# Patient Record
Sex: Female | Born: 1955 | Race: Black or African American | Hispanic: No | Marital: Single | Smoking: Never smoker
Health system: Southern US, Community
[De-identification: ages and names within clinical notes are randomized; demographics above are authoritative.]

## PROBLEM LIST (undated history)

## (undated) DIAGNOSIS — R7303 Prediabetes: Secondary | ICD-10-CM

## (undated) DIAGNOSIS — E119 Type 2 diabetes mellitus without complications: Secondary | ICD-10-CM

## (undated) DIAGNOSIS — I4891 Unspecified atrial fibrillation: Secondary | ICD-10-CM

## (undated) DIAGNOSIS — E669 Obesity, unspecified: Secondary | ICD-10-CM

## (undated) DIAGNOSIS — F32A Depression, unspecified: Secondary | ICD-10-CM

## (undated) DIAGNOSIS — F329 Major depressive disorder, single episode, unspecified: Secondary | ICD-10-CM

## (undated) DIAGNOSIS — Z531 Procedure and treatment not carried out because of patient's decision for reasons of belief and group pressure: Secondary | ICD-10-CM

## (undated) DIAGNOSIS — I483 Typical atrial flutter: Principal | ICD-10-CM

## (undated) DIAGNOSIS — F419 Anxiety disorder, unspecified: Secondary | ICD-10-CM

## (undated) DIAGNOSIS — G43909 Migraine, unspecified, not intractable, without status migrainosus: Secondary | ICD-10-CM

## (undated) DIAGNOSIS — I10 Essential (primary) hypertension: Secondary | ICD-10-CM

## (undated) HISTORY — DX: Prediabetes: R73.03

## (undated) HISTORY — DX: Obesity, unspecified: E66.9

## (undated) HISTORY — DX: Depression, unspecified: F32.A

## (undated) HISTORY — DX: Typical atrial flutter: I48.3

## (undated) HISTORY — DX: Anxiety disorder, unspecified: F41.9

## (undated) HISTORY — DX: Morbid (severe) obesity due to excess calories: E66.01

## (undated) HISTORY — DX: Migraine, unspecified, not intractable, without status migrainosus: G43.909

## (undated) HISTORY — DX: Major depressive disorder, single episode, unspecified: F32.9

## (undated) HISTORY — PX: ABLATION: SHX5711

## (undated) HISTORY — DX: Unspecified atrial fibrillation: I48.91

## (undated) HISTORY — DX: Essential (primary) hypertension: I10

---

## 1999-01-10 ENCOUNTER — Emergency Department (HOSPITAL_COMMUNITY): Admission: EM | Admit: 1999-01-10 | Discharge: 1999-01-10 | Payer: Self-pay | Admitting: Emergency Medicine

## 2002-02-27 ENCOUNTER — Encounter: Admission: RE | Admit: 2002-02-27 | Discharge: 2002-02-27 | Payer: Self-pay | Admitting: Family Medicine

## 2002-02-27 ENCOUNTER — Encounter: Payer: Self-pay | Admitting: Family Medicine

## 2002-03-02 ENCOUNTER — Encounter: Admission: RE | Admit: 2002-03-02 | Discharge: 2002-04-05 | Payer: Self-pay | Admitting: Family Medicine

## 2002-05-01 ENCOUNTER — Encounter: Payer: Self-pay | Admitting: Family Medicine

## 2002-05-01 ENCOUNTER — Ambulatory Visit (HOSPITAL_COMMUNITY): Admission: RE | Admit: 2002-05-01 | Discharge: 2002-05-01 | Payer: Self-pay | Admitting: Family Medicine

## 2002-05-01 ENCOUNTER — Encounter (INDEPENDENT_AMBULATORY_CARE_PROVIDER_SITE_OTHER): Payer: Self-pay | Admitting: Internal Medicine

## 2003-03-08 ENCOUNTER — Encounter: Admission: RE | Admit: 2003-03-08 | Discharge: 2003-03-08 | Payer: Self-pay | Admitting: Internal Medicine

## 2003-03-14 ENCOUNTER — Encounter: Admission: RE | Admit: 2003-03-14 | Discharge: 2003-03-14 | Payer: Self-pay | Admitting: Internal Medicine

## 2003-03-21 ENCOUNTER — Encounter: Admission: RE | Admit: 2003-03-21 | Discharge: 2003-03-21 | Payer: Self-pay | Admitting: Internal Medicine

## 2003-10-23 ENCOUNTER — Encounter: Admission: RE | Admit: 2003-10-23 | Discharge: 2003-10-23 | Payer: Self-pay | Admitting: Family Medicine

## 2003-11-21 ENCOUNTER — Encounter: Admission: RE | Admit: 2003-11-21 | Discharge: 2004-02-19 | Payer: Self-pay | Admitting: Neurology

## 2004-04-13 ENCOUNTER — Encounter: Admission: RE | Admit: 2004-04-13 | Discharge: 2004-04-13 | Payer: Self-pay | Admitting: Internal Medicine

## 2005-05-31 ENCOUNTER — Ambulatory Visit: Payer: Self-pay | Admitting: Internal Medicine

## 2005-08-13 ENCOUNTER — Encounter: Admission: RE | Admit: 2005-08-13 | Discharge: 2005-11-07 | Payer: Self-pay | Admitting: Internal Medicine

## 2006-08-02 ENCOUNTER — Ambulatory Visit: Payer: Self-pay | Admitting: Internal Medicine

## 2006-08-04 DIAGNOSIS — I10 Essential (primary) hypertension: Secondary | ICD-10-CM | POA: Insufficient documentation

## 2006-08-04 DIAGNOSIS — E669 Obesity, unspecified: Secondary | ICD-10-CM | POA: Insufficient documentation

## 2006-08-04 DIAGNOSIS — M542 Cervicalgia: Secondary | ICD-10-CM

## 2006-08-10 ENCOUNTER — Ambulatory Visit (HOSPITAL_COMMUNITY): Admission: RE | Admit: 2006-08-10 | Discharge: 2006-08-10 | Payer: Self-pay | Admitting: Internal Medicine

## 2006-12-22 DIAGNOSIS — R209 Unspecified disturbances of skin sensation: Secondary | ICD-10-CM

## 2007-04-07 ENCOUNTER — Emergency Department (HOSPITAL_COMMUNITY): Admission: EM | Admit: 2007-04-07 | Discharge: 2007-04-07 | Payer: Self-pay | Admitting: Emergency Medicine

## 2007-04-11 ENCOUNTER — Encounter: Admission: RE | Admit: 2007-04-11 | Discharge: 2007-06-19 | Payer: Self-pay | Admitting: Family Medicine

## 2007-04-12 ENCOUNTER — Encounter: Admission: RE | Admit: 2007-04-12 | Discharge: 2007-04-12 | Payer: Self-pay | Admitting: Family Medicine

## 2007-05-16 ENCOUNTER — Encounter: Admission: RE | Admit: 2007-05-16 | Discharge: 2007-05-16 | Payer: Self-pay | Admitting: Orthopedic Surgery

## 2007-08-03 ENCOUNTER — Telehealth: Payer: Self-pay | Admitting: *Deleted

## 2007-08-04 ENCOUNTER — Ambulatory Visit: Payer: Self-pay | Admitting: Internal Medicine

## 2007-08-04 ENCOUNTER — Encounter (INDEPENDENT_AMBULATORY_CARE_PROVIDER_SITE_OTHER): Payer: Self-pay | Admitting: Internal Medicine

## 2007-08-04 ENCOUNTER — Telehealth (INDEPENDENT_AMBULATORY_CARE_PROVIDER_SITE_OTHER): Payer: Self-pay | Admitting: *Deleted

## 2007-08-17 LAB — CONVERTED CEMR LAB
BUN: 8 mg/dL (ref 6–23)
CO2: 30 meq/L (ref 19–32)
Calcium: 8.9 mg/dL (ref 8.4–10.5)
Chloride: 101 meq/L (ref 96–112)
Glucose, Bld: 91 mg/dL (ref 70–99)
Potassium: 3.5 meq/L (ref 3.5–5.3)
Sodium: 138 meq/L (ref 135–145)

## 2007-08-28 ENCOUNTER — Ambulatory Visit: Payer: Self-pay | Admitting: *Deleted

## 2007-08-28 ENCOUNTER — Encounter (INDEPENDENT_AMBULATORY_CARE_PROVIDER_SITE_OTHER): Payer: Self-pay | Admitting: Internal Medicine

## 2007-10-25 ENCOUNTER — Ambulatory Visit (HOSPITAL_COMMUNITY): Admission: RE | Admit: 2007-10-25 | Discharge: 2007-10-25 | Payer: Self-pay | Admitting: Internal Medicine

## 2008-02-19 ENCOUNTER — Telehealth: Payer: Self-pay | Admitting: Infectious Disease

## 2008-09-24 ENCOUNTER — Encounter (INDEPENDENT_AMBULATORY_CARE_PROVIDER_SITE_OTHER): Payer: Self-pay | Admitting: Internal Medicine

## 2008-09-24 ENCOUNTER — Ambulatory Visit: Payer: Self-pay | Admitting: Internal Medicine

## 2008-09-24 DIAGNOSIS — L538 Other specified erythematous conditions: Secondary | ICD-10-CM

## 2008-09-25 LAB — CONVERTED CEMR LAB
ALT: 11 units/L (ref 0–35)
AST: 14 units/L (ref 0–37)
Albumin: 4.1 g/dL (ref 3.5–5.2)
Alkaline Phosphatase: 74 units/L (ref 39–117)
BUN: 8 mg/dL (ref 6–23)
CO2: 24 meq/L (ref 19–32)
Calcium: 9.3 mg/dL (ref 8.4–10.5)
Chloride: 102 meq/L (ref 96–112)
Cholesterol: 225 mg/dL — ABNORMAL HIGH (ref 0–200)
Creatinine, Ser: 0.72 mg/dL (ref 0.40–1.20)
Creatinine, Urine: 194.2 mg/dL
Glucose, Bld: 107 mg/dL — ABNORMAL HIGH (ref 70–99)
HCT: 42.4 % (ref 36.0–46.0)
HDL: 88 mg/dL (ref >39)
Hemoglobin: 13.2 g/dL (ref 12.0–15.0)
LDL Cholesterol: 125 mg/dL — ABNORMAL HIGH (ref 0–99)
MCHC: 31.1 g/dL (ref 30.0–36.0)
MCV: 93.2 fL (ref 78.0–100.0)
Microalb Creat Ratio: 7.3 mg/g (ref 0.0–30.0)
Microalb, Ur: 1.42 mg/dL (ref 0.00–1.89)
Platelets: 273 10*3/uL (ref 150–400)
Potassium: 4.1 meq/L (ref 3.5–5.3)
RBC: 4.55 M/uL (ref 3.87–5.11)
RDW: 13.4 % (ref 11.5–15.5)
Sodium: 140 meq/L (ref 135–145)
TSH: 1.651 microintl units/mL (ref 0.350–4.50)
Total Bilirubin: 0.5 mg/dL (ref 0.3–1.2)
Total CHOL/HDL Ratio: 2.6
Total Protein: 7.2 g/dL (ref 6.0–8.3)
Triglycerides: 62 mg/dL (ref <150)
VLDL: 12 mg/dL (ref 0–40)
WBC: 7 10*3/uL (ref 4.0–10.5)

## 2008-10-14 ENCOUNTER — Ambulatory Visit: Payer: Self-pay | Admitting: Internal Medicine

## 2008-10-14 LAB — CONVERTED CEMR LAB: OCCULT 3: NEGATIVE

## 2008-10-14 LAB — FECAL OCCULT BLOOD, GUAIAC: Fecal Occult Blood: NEGATIVE

## 2009-12-31 ENCOUNTER — Telehealth (INDEPENDENT_AMBULATORY_CARE_PROVIDER_SITE_OTHER): Payer: Self-pay | Admitting: Dermatology

## 2010-01-02 ENCOUNTER — Ambulatory Visit: Payer: Self-pay | Admitting: Internal Medicine

## 2010-01-02 DIAGNOSIS — M6740 Ganglion, unspecified site: Secondary | ICD-10-CM | POA: Insufficient documentation

## 2010-01-02 LAB — CONVERTED CEMR LAB
HDL: 91 mg/dL (ref >39)
LDL Cholesterol: 106 mg/dL — ABNORMAL HIGH (ref 0–99)
Sodium: 141 meq/L (ref 135–145)
Total CHOL/HDL Ratio: 2.3
Triglycerides: 59 mg/dL (ref <150)
VLDL: 12 mg/dL (ref 0–40)

## 2010-01-02 LAB — HM PAP SMEAR: HM Pap smear: NEGATIVE

## 2010-02-05 ENCOUNTER — Telehealth (INDEPENDENT_AMBULATORY_CARE_PROVIDER_SITE_OTHER): Payer: Self-pay | Admitting: Dermatology

## 2010-10-08 ENCOUNTER — Telehealth: Payer: Self-pay | Admitting: Internal Medicine

## 2010-11-16 ENCOUNTER — Ambulatory Visit: Admit: 2010-11-16 | Payer: Self-pay

## 2010-11-28 ENCOUNTER — Encounter: Payer: Self-pay | Admitting: Internal Medicine

## 2010-12-08 NOTE — Progress Notes (Signed)
Summary: Refill/gh  Phone Note Refill Request Message from:  Fax from Pharmacy on October 08, 2010 11:46 AM  Refills Requested: Medication #1:  ATENOLOL 50 MG  TABS Take 1 tablet by mouth once a day   Last Refilled: 03/10/2010 Last labs and ovffice vist were 12/2009   Method Requested: Electronic Initial call taken by: Angelina Ok RN,  October 08, 2010 11:46 AM  Follow-up for Phone Call        Refilled electronically.  Please schedule a follow-up appointment with patient's PCP and notify patient. Follow-up by: Margarito Liner MD,  October 08, 2010 11:50 AM  Additional Follow-up for Phone Call Additional follow up Details #1::        Flag to C. Boone to schedule pt with an appointment with her PCP. Angelina Ok RN  October 08, 2010 2:13 PM  Additional Follow-up by: Angelina Ok RN,  October 08, 2010 2:13 PM    Prescriptions: ATENOLOL 50 MG  TABS (ATENOLOL) Take 1 tablet by mouth once a day  #90 x 0   Entered and Authorized by:   Margarito Liner MD   Signed by:   Margarito Liner MD on 10/08/2010   Method used:   Electronically to        [facility] (retail)       [address] [place], [place]  [address]       Ph: [phone]       Fax: [phone]   RxID:   [id number]

## 2010-12-08 NOTE — Assessment & Plan Note (Signed)
Summary: OV to continue getting meds/gg  see last note   Vital Signs:  Patient profile:   55 year old female Height:      65 inches (165.10 cm) Weight:      331.1 pounds (147.14 kg) BMI:     54.06 Temp:     97.3 degrees F (36.28 degrees C) oral Pulse rate:   57 / minute BP sitting:   158 / 96  (right arm) Cuff size:   large  Vitals Entered By: Theotis Barrio NT II (January 02, 2010 10:37 AM) CC: LOWER BACK  PAIN X 3 DAY  /  MEDICATION REFILL  /  RIGHT FOOT PAIN Pain Assessment Patient in pain? yes     Location: lower back Intensity:     10+ Type: sharp Onset of pain  X3 DAYS Nutritional Status BMI of > 30 = obese  Have you ever been in a relationship where you felt threatened, hurt or afraid?No   Does patient need assistance? Functional Status Self care Ambulation Normal Comments LOWER BACK PAIN X 3 DAYS /  MEDICATION REFILL /  RIGHT FOOT PAIN    Primary Care Provider:  Valetta Close MD  CC:  LOWER BACK  PAIN X 3 DAY  /  MEDICATION REFILL  /  RIGHT FOOT PAIN.  History of Present Illness: Ms. Sobol is a 55 year old woman with pmhx of HTN, and obesity who presents to clinic for general check-up. Patient reports she is feeling good, is currently enrolled in school receiving her Masters degree. Patient would like medication refills. Patient would also like right foot to be looked at as she has been experiencing some pain while walking within the last month. She reports she feels a bony prominence over the lateral aspect of her right foot.   Depression History:      The patient denies a depressed mood most of the day and a diminished interest in her usual daily activities.         Preventive Screening-Counseling & Management  Alcohol-Tobacco     Smoking Status: never  Caffeine-Diet-Exercise     Does Patient Exercise: yes     Type of exercise: WII  Current Medications (verified): 1)  Hydrochlorothiazide 25 Mg Tabs (Hydrochlorothiazide) .... Daily 2)  Atenolol 50  Mg  Tabs (Atenolol) .... Take 1 Tablet By Mouth Once A Day  Allergies (verified): 1)  ! 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 MS:  Complains of joint pain; pain in right foot .  Physical Exam  General:  alert and well-developed.   Head:  normocephalic and atraumatic.   Eyes:  vision grossly intact, pupils equal, pupils round, and pupils reactive to light.   Neck:  supple, full ROM, and no masses.   Lungs:  normal respiratory effort and normal breath sounds.   Heart:  normal rate, regular rhythm, no murmur, no gallop, no rub, and no JVD.   Abdomen:  soft, non-tender, normal bowel sounds, no distention, and no masses.  Genitalia:  normal introitus, no external lesions, no vaginal discharge, mucosa pink and moist, no vaginal or cervical lesions, no vaginal atrophy, no friaility or hemorrhage, normal uterus size and position, and no adnexal masses or tenderness.   Msk:  normal ROM.   ganglion cyst located on lateral aspect of right foot, no drainage, slightly tender on palpation Extremities:  no LE edema Neurologic:  alert & oriented X3 and cranial nerves II-XII intact.     Impression & Recommendations:  Problem # 1:  HYPERTENSION (ICD-401.9) Assessment Deteriorated Patient's blood pressure is elevated 2/2 to running out of meds for a few days. Pt denied any headaches, vision abnormalities, or any  other complaints or concerns. In reviewing patient's blood pressure regimen, it has been relatively well controlled with a few episodes of elevated pressures. I will start patient on Norvasc today and have patient follow up in 1 month to bp recheck. Patient is agreeable to this plan. Will also check renal function and potassium today.   Her updated medication list for this problem includes:    Hydrochlorothiazide 25 Mg Tabs (Hydrochlorothiazide) .Marland Kitchen... Daily    Atenolol 50 Mg Tabs (Atenolol) .Marland Kitchen... Take 1 tablet by mouth once a day    Amlodipine Besylate 5 Mg Tabs (Amlodipine besylate) .Marland Kitchen... Take 1 tablet by mouth once a day  Orders: T-Basic Metabolic Panel [id number])  BP today: 158/96 Prior BP: 137/81 (09/24/2008)  Labs Reviewed: K+: 4.1 (09/24/2008) Creat: : 0.72 (09/24/2008)   Chol: 225 (09/24/2008)   HDL: 88 (09/24/2008)   LDL: 125 (09/24/2008)   TG: 62 (09/24/2008)  Problem # 2:  Preventive Health Care (ICD-V70.0) All immunizations and screening tests reviewed. Due to lack of insurance patient is unable to afford mammogram, however will speak with Jaynee Eagles regarding obtaining scholarship or assistance getting mammogram. PAP was also performed and flu shot given. Will also check lipid panel today. Last lipid panel was wnl.   Problem # 3:  GANGLION CYST (ICD-727.43) Assessment: New Right foot pain 2/2 ganglion cyst. Advised patient to apply warm compresses to area. Will also check an xray of foot to rule out any abnormal bony prominences and have patient follow up in 1 month. Advised NSAIDS, or tyelenol for pain management.   Orders: Diagnostic X-Ray/Fluoroscopy (Diagnostic X-Ray/Flu)  Complete Medication List: 1)  Hydrochlorothiazide 25 Mg Tabs (Hydrochlorothiazide) .... Daily 2)  Atenolol 50 Mg Tabs (Atenolol) .... Take 1 tablet by mouth once a day 3)  Amlodipine Besylate 5 Mg Tabs (Amlodipine besylate) .... Take 1 tablet by mouth once a day  Other Orders: T-Lipid Profile  [id number]) T-PAP [facility]) [phone]) Mammogram (Screening) (Mammo)  Patient Instructions: 1)  Please schedule a follow-up appointment in 1 month. 2)  Check your Blood Pressure regularly. If it is above 170: you should make an appointment. 3)  It is important that you exercise regularly at least 20 minutes 5 times a week. If you develop chest pain, have severe difficulty breathing, or feel very tired , stop exercising immediately and seek medical attention. 4)  Please apply warm compresses to right foot, and please contact office if pain or cyst worsen.  Prescriptions: ATENOLOL 50 MG  TABS (ATENOLOL) Take 1 tablet by mouth once a day  #90 x 11   Entered and Authorized by:   Melida Quitter MD   Signed by:   Melida Quitter MD on 01/02/2010   Method used:   Electronically to        [facility] (retail)  [address] [place], [place]  [address]       Ph: [phone]       Fax: [phone]   RxID:   [id number] HYDROCHLOROTHIAZIDE 25 MG TABS (HYDROCHLOROTHIAZIDE) Daily  #93 x 11   Entered and Authorized by:   Melida Quitter MD   Signed by:   Melida Quitter MD on 01/02/2010   Method used:   Electronically to        [facility] (retail)       [address] [place], [place]  [address]       Ph: [phone]       Fax: [phone]   RxID:   [id number] AMLODIPINE BESYLATE 5 MG TABS (AMLODIPINE BESYLATE) Take 1 tablet by mouth once a day  #31 x 0   Entered and Authorized by:   Melida Quitter MD   Signed by:   Melida Quitter MD on 01/02/2010   Method used:   Electronically to        [facility] (retail)       [address] [place], [place]  [address]       Ph: [phone]       Fax: [phone]   RxID:   [id number]  Process Orders Check Orders Results:     [facility]: ABN not required for this insurance Tests Sent for  requisitioning (January 02, 2010 5:14 PM):     01/02/2010: [facility] -- T-Lipid Profile [id number] (signed)     01/02/2010: [facility] -- T-Basic Metabolic Panel [phone] (signed)    Prevention & Chronic Care Immunizations   Influenza vaccine: Not documented   Influenza vaccine deferral: Refused  (01/02/2010)    Tetanus booster: Not documented    Pneumococcal vaccine: Not documented  Colorectal Screening   Hemoccult: Not documented    Colonoscopy: Not documented  Other Screening   Pap smear:  Specimen Adequacy: Satisfactory for evaluation.   Interpretation/Result:Negative for intraepithelial Lesion or Malignancy.     (09/24/2008)   Pap smear action/deferral: Ordered  (01/02/2010)   Pap smear due: 10/2009    Mammogram: normal, recheck in one year  (10/25/2007)   Mammogram action/deferral: Ordered  (01/02/2010)   Smoking status: never  (01/02/2010)  Lipids   Total Cholesterol: 225  (09/24/2008)   Lipid panel action/deferral: Lipid Panel ordered   LDL: 125  (09/24/2008)   LDL Direct: Not documented   HDL: 88  (09/24/2008)   Triglycerides: 62  (09/24/2008)  Hypertension   Last Blood Pressure: 158 / 96  (01/02/2010)   Serum creatinine: 0.72  (09/24/2008)   BMP action: Ordered   Serum potassium 4.1  (09/24/2008)    Hypertension flowsheet reviewed?: Yes   Progress toward BP goal: Deteriorated  Self-Management Support :   Personal Goals (by the next clinic visit) :      Personal blood pressure goal: 140/90  (01/02/2010)   Patient will work on the following items until the next clinic visit to reach self-care goals:     Medications and monitoring: take my medicines every day, bring all of my medications to every visit  (01/02/2010)     Eating: drink diet soda or water instead of juice or soda, eat more vegetables,  use fresh or frozen vegetables, eat foods that are low in salt, eat baked foods instead of fried foods, eat fruit  for snacks and desserts, limit or avoid alcohol  (01/02/2010)    Hypertension self-management support: BP self-monitoring log, Education handout, Resources for patients handout, Written self-care plan  (01/02/2010)   Hypertension self-care plan printed.   Hypertension education handout printed      Resource handout printed.   Nursing Instructions: Pap smear today Schedule screening mammogram (see order)    Process Orders Check Orders Results:     [facility]: ABN not required for this insurance Tests Sent for requisitioning (January 02, 2010 5:14 PM):     01/02/2010: [facility] -- T-Lipid Profile [phone] (signed)     01/02/2010: [facility] -- T-Basic Metabolic Panel [id number] (signed)

## 2010-12-08 NOTE — Progress Notes (Signed)
Summary: refill/ hla  Phone Note Refill Request Message from:  Patient on February 05, 2010 11:15 AM  Refills Requested: Medication #1:  AMLODIPINE BESYLATE 5 MG TABS Take 1 tablet by mouth once a day.   Dosage confirmed as above?Dosage Confirmed   Last Refilled: 2/25 Initial call taken by: Marin Roberts RN,  February 05, 2010 11:18 AM  Follow-up for Phone Call       Follow-up by: Aris Lot MD,  February 05, 2010 2:03 PM    Prescriptions: AMLODIPINE BESYLATE 5 MG TABS (AMLODIPINE BESYLATE) Take 1 tablet by mouth once a day  #31 x 6   Entered and Authorized by:   Aris Lot MD   Signed by:   Aris Lot MD on 02/05/2010   Method used:   Electronically to        [facility] (retail)       [address] [place], [place]  [address]       Ph: [phone]       Fax: [phone]   RxID:   [id number]

## 2010-12-08 NOTE — Progress Notes (Signed)
Summary: refill/gg  Phone Note Refill Request  on December 31, 2009 9:21 AM  Refills Requested: Medication #1:  ATENOLOL 50 MG  TABS Take 1 tablet by mouth once a day   Last Refilled: 09/10/2009  Method Requested: Electronic Initial call taken by: Merrie Roof RN,  December 31, 2009 9:21 AM  Follow-up for Phone Call        Refill approved-nurse to complete  Patient has not been in clinic in quite some time. Please call to have her scheduled for a regular followup appointment in the near future.  Additional Follow-up for Phone Call Additional follow up Details #1::        Pt phone disconnected, pharmacy informed and will have pt call us. Additional Follow-up by: Merrie Roof RN,  December 31, 2009 2:22 PM

## 2010-12-22 ENCOUNTER — Encounter: Payer: Self-pay | Admitting: Internal Medicine

## 2011-01-27 ENCOUNTER — Other Ambulatory Visit: Payer: Self-pay | Admitting: *Deleted

## 2011-01-27 MED ORDER — AMLODIPINE BESYLATE 5 MG PO TABS: 5.0000 mg | ORAL_TABLET | Freq: Every day | ORAL | Status: DC

## 2011-01-27 NOTE — Telephone Encounter (Signed)
Pt has an appointment on 02/02/2011.

## 2011-02-02 ENCOUNTER — Ambulatory Visit: Payer: Self-pay | Admitting: Internal Medicine

## 2011-02-05 ENCOUNTER — Other Ambulatory Visit: Payer: Self-pay | Admitting: *Deleted

## 2011-02-08 MED ORDER — ATENOLOL 50 MG PO TABS: 50.0000 mg | ORAL_TABLET | Freq: Every day | ORAL | Status: DC

## 2011-02-23 ENCOUNTER — Ambulatory Visit: Payer: Self-pay | Admitting: Internal Medicine

## 2011-02-23 ENCOUNTER — Other Ambulatory Visit: Payer: Self-pay | Admitting: *Deleted

## 2011-02-24 MED ORDER — AMLODIPINE BESYLATE 5 MG PO TABS: 5.0000 mg | ORAL_TABLET | Freq: Every day | ORAL | Status: DC

## 2011-03-22 ENCOUNTER — Encounter: Payer: Self-pay | Admitting: Internal Medicine

## 2011-03-30 ENCOUNTER — Other Ambulatory Visit: Payer: Self-pay | Admitting: *Deleted

## 2011-03-30 DIAGNOSIS — I10 Essential (primary) hypertension: Secondary | ICD-10-CM

## 2011-03-30 MED ORDER — HYDROCHLOROTHIAZIDE 25 MG PO TABS: 25.0000 mg | ORAL_TABLET | Freq: Every day | ORAL | Status: DC

## 2011-04-01 ENCOUNTER — Other Ambulatory Visit: Payer: Self-pay | Admitting: *Deleted

## 2011-04-01 DIAGNOSIS — I10 Essential (primary) hypertension: Secondary | ICD-10-CM

## 2011-04-01 NOTE — Telephone Encounter (Signed)
Pt has an appt 6/11; but states she needs refill before the appt.

## 2011-04-02 MED ORDER — AMLODIPINE BESYLATE 5 MG PO TABS: 5.0000 mg | ORAL_TABLET | Freq: Every day | ORAL | Status: DC

## 2011-04-02 MED ORDER — HYDROCHLOROTHIAZIDE 25 MG PO TABS: 25.0000 mg | ORAL_TABLET | Freq: Every day | ORAL | Status: DC

## 2011-04-19 ENCOUNTER — Ambulatory Visit (INDEPENDENT_AMBULATORY_CARE_PROVIDER_SITE_OTHER): Payer: Self-pay | Admitting: Internal Medicine

## 2011-04-19 VITALS — BP 120/80 | HR 64 | Temp 97.7°F | Ht 65.0 in | Wt 334.3 lb

## 2011-04-19 DIAGNOSIS — I10 Essential (primary) hypertension: Secondary | ICD-10-CM

## 2011-04-19 DIAGNOSIS — Z1231 Encounter for screening mammogram for malignant neoplasm of breast: Secondary | ICD-10-CM

## 2011-04-19 DIAGNOSIS — Z0000 Encounter for general adult medical examination without abnormal findings: Secondary | ICD-10-CM

## 2011-04-19 LAB — LIPID PANEL
Cholesterol: 225 mg/dL — ABNORMAL HIGH (ref 0–200)
VLDL: 12 mg/dL (ref 0–40)

## 2011-04-19 MED ORDER — AMLODIPINE BESYLATE 5 MG PO TABS: 5.0000 mg | ORAL_TABLET | Freq: Every day | ORAL | Status: DC

## 2011-04-19 MED ORDER — ATENOLOL 50 MG PO TABS: 50.0000 mg | ORAL_TABLET | Freq: Every day | ORAL | Status: DC

## 2011-04-19 MED ORDER — HYDROCHLOROTHIAZIDE 25 MG PO TABS: 25.0000 mg | ORAL_TABLET | Freq: Every day | ORAL | Status: DC

## 2011-04-19 NOTE — Patient Instructions (Signed)
Please take your medicines as prescribed. Please schedule a follow up appointment in 3 months or earlier if needed. Please return your hemoccult cards to the clinic.

## 2011-04-20 ENCOUNTER — Encounter: Payer: Self-pay | Admitting: Internal Medicine

## 2011-04-20 DIAGNOSIS — Z0000 Encounter for general adult medical examination without abnormal findings: Secondary | ICD-10-CM | POA: Insufficient documentation

## 2011-04-20 LAB — BASIC METABOLIC PANEL WITH GFR
BUN: 12 mg/dL (ref 6–23)
Calcium: 9.5 mg/dL (ref 8.4–10.5)
GFR, Est African American: >60 mL/min (ref >60)
Potassium: 3.9 mEq/L (ref 3.5–5.3)
Sodium: 141 mEq/L (ref 135–145)

## 2011-04-20 NOTE — Progress Notes (Signed)
  Subjective:    [name], female    DOB: 04-Apr-1956, 55 y.o.   MRN: [id number]  HPI: 55 year old woman with past medical history significant for hypertension comes to the clinic for a followup visit. She reports occasional twitching of her left eye and sometimes left side of the face, the last episode was 2 weeks ago. She states  these episodes usually occur when something bad is about to happen. Her godmother got sick when she had this episode 2 weeks ago. She denies any associated weakness, numbness, slurred speech, blurry vision. The episodes are transient and resolve spontaneously. Otherwise denies any fever,chills, nausea, vomiting or  diarrhea. She is requesting  medication refill.     Review of Systems  Constitutional: Negative for fever, chills, activity change, appetite change and fatigue.  HENT: Negative for hearing loss, neck pain, neck stiffness and ear discharge.   Respiratory: Negative for apnea, cough, choking, chest tightness and shortness of breath.   Cardiovascular: Negative for chest pain, palpitations and leg swelling.  Gastrointestinal: Negative for abdominal distention.  Genitourinary: Negative for dysuria, hematuria and difficulty urinating.  Musculoskeletal: Negative for arthralgias.  Neurological: Negative for dizziness, facial asymmetry, light-headedness and headaches.  Hematological: Negative for adenopathy.  Psychiatric/Behavioral: Negative for agitation.       Objective:   Physical Exam  Constitutional: She is oriented to person, place, and time. She appears well-developed and well-nourished.       obese  HENT:  Head: Normocephalic and atraumatic.  Right Ear: External ear normal.  Left Ear: External ear normal.  Eyes: Conjunctivae and EOM are normal. Pupils are equal, round, and reactive to light.  Neck: Normal range of motion. Neck supple. No tracheal deviation present. No thyromegaly present.  Cardiovascular: Normal rate, regular  rhythm, normal heart sounds and intact distal pulses.   Pulmonary/Chest: Effort normal and breath sounds normal.  Abdominal: Soft. Bowel sounds are normal. She exhibits no distension and no mass. There is no tenderness. There is no rebound and no guarding.  Musculoskeletal: Normal range of motion.  Lymphadenopathy:    She has no cervical adenopathy.  Neurological: She is alert and oriented to person, place, and time. She has normal reflexes. No cranial nerve deficit. Coordination normal.  Skin: Skin is warm.          Assessment & Plan:

## 2011-04-20 NOTE — Assessment & Plan Note (Addendum)
She was advised lifestyle modification and exercise for weight reduction. She states that she is working on it - was encouraged to continue the same. Will refer her for mammogram. Will give her hemoccult cards and refer her for the study for colonoscopy. Will get a lipid panel. Her last pap smear from a year ago was reviewed( negative for intraepithelial cells).   Her lipid panel and BMET was reviewed. She needs to continue therapeutic lifestyle changes for the level of her total cholestrol and LDL.

## 2011-04-20 NOTE — Assessment & Plan Note (Addendum)
Her blood pressure was slightly elevated when she walked in but the repeat manual recheck was 120/80. Her medications were refilled. Will continue her on this current regimen. Will also check a BMET today.

## 2011-04-27 ENCOUNTER — Other Ambulatory Visit: Payer: Self-pay

## 2011-04-27 DIAGNOSIS — Z0000 Encounter for general adult medical examination without abnormal findings: Secondary | ICD-10-CM

## 2011-04-27 LAB — POC HEMOCCULT BLD/STL (HOME/3-CARD/SCREEN)
Card #2 Fecal Occult Blod, POC: NEGATIVE
Fecal Occult Blood, POC: NEGATIVE

## 2011-08-28 ENCOUNTER — Other Ambulatory Visit: Payer: Self-pay | Admitting: Internal Medicine

## 2011-11-01 ENCOUNTER — Other Ambulatory Visit: Payer: Self-pay | Admitting: Internal Medicine

## 2011-12-13 ENCOUNTER — Other Ambulatory Visit: Payer: Self-pay | Admitting: Internal Medicine

## 2011-12-15 ENCOUNTER — Ambulatory Visit: Payer: Self-pay | Admitting: Internal Medicine

## 2011-12-21 ENCOUNTER — Ambulatory Visit (INDEPENDENT_AMBULATORY_CARE_PROVIDER_SITE_OTHER): Payer: Self-pay | Admitting: Internal Medicine

## 2011-12-21 ENCOUNTER — Encounter: Payer: Self-pay | Admitting: Internal Medicine

## 2011-12-21 VITALS — BP 144/85 | HR 73 | Temp 97.7°F | Resp 20 | Ht 65.0 in | Wt 344.8 lb

## 2011-12-21 DIAGNOSIS — I10 Essential (primary) hypertension: Secondary | ICD-10-CM

## 2011-12-21 DIAGNOSIS — Z0000 Encounter for general adult medical examination without abnormal findings: Secondary | ICD-10-CM

## 2011-12-21 DIAGNOSIS — E785 Hyperlipidemia, unspecified: Secondary | ICD-10-CM

## 2011-12-21 LAB — COMPLETE METABOLIC PANEL WITH GFR
ALT: 19 U/L (ref 0–35)
AST: 19 U/L (ref 0–37)
Alkaline Phosphatase: 66 U/L (ref 39–117)
Creat: 0.82 mg/dL (ref 0.50–1.10)
Sodium: 140 mEq/L (ref 135–145)
Total Bilirubin: 0.3 mg/dL (ref 0.3–1.2)
Total Protein: 7.3 g/dL (ref 6.0–8.3)

## 2011-12-21 LAB — LIPID PANEL
HDL: 78 mg/dL (ref >39)
LDL Cholesterol: 118 mg/dL — ABNORMAL HIGH (ref 0–99)
Total CHOL/HDL Ratio: 2.7 Ratio
VLDL: 15 mg/dL (ref 0–40)

## 2011-12-21 MED ORDER — ATENOLOL 50 MG PO TABS: 50.0000 mg | ORAL_TABLET | Freq: Every day | ORAL | Status: DC

## 2011-12-21 MED ORDER — HYDROCHLOROTHIAZIDE 25 MG PO TABS: 25.0000 mg | ORAL_TABLET | Freq: Every day | ORAL | Status: DC

## 2011-12-21 MED ORDER — AMLODIPINE BESYLATE 5 MG PO TABS: 5.0000 mg | ORAL_TABLET | Freq: Every day | ORAL | Status: DC

## 2011-12-21 NOTE — Patient Instructions (Signed)
Please schedule a follow up appointment in 3 months or earlier if needed. . Please bring your medication bottles with your next appointment. Please take your medicines as prescribed. I will call you with your lab results if anything will be abnormal. 

## 2011-12-21 NOTE — Progress Notes (Signed)
  Subjective:    [name], female    DOB: 24-Sep-1956, 56 y.o.   MRN: [id number]  HPI: 56 year old woman with past medical history significant for hypertension, morbid obesity comes to the clinic for a followup visit.  She has been noticed to have about 10 pounds weight gain at todays's visit and was just wondering if this could be related to Norvasc .She states that she has been trying to exercise and diet.  Otherwise denies any headaches, weakness, numbness, chest pain, palpitations or dizziness.     Review of Systems  Constitutional: Negative for fever and fatigue.  HENT: Negative for nosebleeds, congestion and postnasal drip.   Respiratory: Negative for cough, shortness of breath, wheezing and stridor.   Cardiovascular: Negative for chest pain, palpitations and leg swelling.  Musculoskeletal: Negative for arthralgias.  Neurological: Negative for dizziness, facial asymmetry, weakness, light-headedness and headaches.  Hematological: Negative for adenopathy.       Objective:   Physical Exam  Constitutional: She is oriented to person, place, and time. She appears well-developed and well-nourished. No distress.  HENT:  Head: Normocephalic and atraumatic.  Mouth/Throat: No oropharyngeal exudate.  Neck: Normal range of motion. Neck supple. No JVD present. No tracheal deviation present. No thyromegaly present.  Cardiovascular: Normal rate, regular rhythm and normal heart sounds.  Exam reveals no gallop and no friction rub.   No murmur heard. Pulmonary/Chest: Effort normal and breath sounds normal. No stridor. No respiratory distress. She has no wheezes. She has no rales. She exhibits no tenderness.  Abdominal: Soft. Bowel sounds are normal. She exhibits no distension. There is no tenderness. There is no rebound and no guarding.  Musculoskeletal: Normal range of motion. She exhibits no edema and no tenderness.  Lymphadenopathy:    She has no cervical adenopathy.    Neurological: She is alert and oriented to person, place, and time. She has normal reflexes. She displays normal reflexes. No cranial nerve deficit. Coordination normal.  Skin: Skin is warm. She is not diaphoretic.          Assessment & Plan:

## 2011-12-22 NOTE — Assessment & Plan Note (Signed)
Lab Results  Component Value Date   NA 140 12/21/2011   K 4.2 12/21/2011   CL 102 12/21/2011   CO2 28 12/21/2011   BUN 12 12/21/2011   CREATININE 0.82 12/21/2011   CREATININE 0.73 01/02/2010    BP Readings from Last 3 Encounters:  12/21/11 144/85  04/20/11 120/80  01/02/10 158/96    Assessment: Hypertension control:  mildly elevated  Progress toward goals:  deteriorated Barriers to meeting goals:  no barriers identified  Plan: Hypertension treatment:  continue current medications. Check CMET today.

## 2011-12-22 NOTE — Progress Notes (Signed)
Addended by: Elyse Jarvis on: 12/22/2011 04:54 PM   Modules accepted: Orders

## 2011-12-22 NOTE — Assessment & Plan Note (Signed)
She was counseled on diet and exercise. She seems to be more debrided and states would work towards weight reduction.

## 2011-12-22 NOTE — Assessment & Plan Note (Addendum)
-   She was given a scholarship form for mammogram. - She was given a Tdap shot. - She was given Hemoccult cards - check lipid panel.  Update- LDL- 118 , no personal history of heart disease, DM, no family history of heart disease, non- smoker. Will continue with therapeutic lifestyle changes.

## 2012-01-17 ENCOUNTER — Ambulatory Visit (HOSPITAL_COMMUNITY): Payer: Self-pay

## 2012-02-15 ENCOUNTER — Ambulatory Visit (HOSPITAL_COMMUNITY)
Admission: RE | Admit: 2012-02-15 | Discharge: 2012-02-15 | Disposition: A | Discharge status: Home / Self Care | Payer: Self-pay | Source: Ambulatory Visit | Attending: Internal Medicine | Admitting: Internal Medicine

## 2012-02-15 DIAGNOSIS — Z1231 Encounter for screening mammogram for malignant neoplasm of breast: Secondary | ICD-10-CM

## 2012-04-06 ENCOUNTER — Other Ambulatory Visit: Payer: Self-pay | Admitting: Internal Medicine

## 2012-05-02 ENCOUNTER — Other Ambulatory Visit: Payer: Self-pay | Admitting: Internal Medicine

## 2012-08-14 ENCOUNTER — Other Ambulatory Visit: Payer: Self-pay | Admitting: Internal Medicine

## 2012-09-15 ENCOUNTER — Other Ambulatory Visit: Payer: Self-pay | Admitting: Internal Medicine

## 2012-09-16 ENCOUNTER — Other Ambulatory Visit: Payer: Self-pay | Admitting: Internal Medicine

## 2012-12-14 ENCOUNTER — Encounter: Payer: Self-pay | Admitting: Internal Medicine

## 2012-12-14 ENCOUNTER — Ambulatory Visit (INDEPENDENT_AMBULATORY_CARE_PROVIDER_SITE_OTHER): Payer: Self-pay | Admitting: Internal Medicine

## 2012-12-14 VITALS — BP 150/82 | HR 73 | Temp 96.9°F | Ht 65.0 in | Wt 341.5 lb

## 2012-12-14 DIAGNOSIS — H9319 Tinnitus, unspecified ear: Secondary | ICD-10-CM

## 2012-12-14 DIAGNOSIS — I10 Essential (primary) hypertension: Secondary | ICD-10-CM

## 2012-12-14 DIAGNOSIS — Z23 Encounter for immunization: Secondary | ICD-10-CM

## 2012-12-14 DIAGNOSIS — Z0000 Encounter for general adult medical examination without abnormal findings: Secondary | ICD-10-CM

## 2012-12-14 DIAGNOSIS — H9313 Tinnitus, bilateral: Secondary | ICD-10-CM | POA: Insufficient documentation

## 2012-12-14 LAB — BASIC METABOLIC PANEL WITH GFR
CO2: 31 mEq/L (ref 19–32)
Chloride: 101 mEq/L (ref 96–112)
Sodium: 140 mEq/L (ref 135–145)

## 2012-12-14 LAB — CBC
HCT: 39 % (ref 36.0–46.0)
MCV: 88.4 fL (ref 78.0–100.0)
RBC: 4.41 MIL/uL (ref 3.87–5.11)
WBC: 5.8 10*3/uL (ref 4.0–10.5)

## 2012-12-14 LAB — LIPID PANEL: LDL Cholesterol: 127 mg/dL — ABNORMAL HIGH (ref 0–99)

## 2012-12-14 MED ORDER — AMLODIPINE BESYLATE 5 MG PO TABS: 5.0000 mg | ORAL_TABLET | Freq: Every day | ORAL | Status: DC

## 2012-12-14 MED ORDER — HYDROCHLOROTHIAZIDE 25 MG PO TABS: 25.0000 mg | ORAL_TABLET | Freq: Every day | ORAL | Status: DC

## 2012-12-14 MED ORDER — ATENOLOL 50 MG PO TABS: 50.0000 mg | ORAL_TABLET | Freq: Every day | ORAL | Status: DC

## 2012-12-14 MED ORDER — ATENOLOL 100 MG PO TABS: 100.0000 mg | ORAL_TABLET | Freq: Every day | ORAL | Status: DC

## 2012-12-14 NOTE — Assessment & Plan Note (Addendum)
Etiology unclear. No red flags- hearing loss, vertigo, ear discharge etc. Ear exam is benign. Could be related to norvasc( as per up to date CCB can be ototoxic).  - Discontinue Norvasc -Continue to monitor for now.

## 2012-12-14 NOTE — Assessment & Plan Note (Addendum)
BP Readings from Last 3 Encounters:  12/14/12 150/82  12/21/11 144/85  04/20/11 120/80    Lab Results  Component Value Date   NA 140 12/21/2011   K 4.2 12/21/2011   CREATININE 0.82 12/21/2011    Assessment:  Blood pressure control: mildly elevated  Progress toward BP goal:  unchanged  Comments: Blood pressure was mildly elevated. She is running out of her atenolol. I think patient does not need to be on 3 antihypertensive medicines. I offered her that we can take her off from one medication and reassess the blood pressure in about a month, but she is adamant on being all three anti- HTN. But given tinnitus as a potential side effect from CCB( norvasc), I would discontinue that and increase dose for atenolol( with permissible HR).  She was advised to maintain a blood pressure log , with taking them twiceweekly readings and bring that with her next clinic appointment. Check BMET today.  Plan:  Medications:  Discontinue Norvasc. Increase atenolol from 50 to 100 mg daily. RTC in 2-3 weeks  Educational resources provided: brochure  Self management tools provided:    Other plans: Check BMET. Encouraged her for weight loss.

## 2012-12-14 NOTE — Assessment & Plan Note (Signed)
There was some confusion patient did not get DTaP shot with last appointment. - She was given DTaP shot today. -She does not have insurance and getting colonoscopy would be difficult. Would give her number to [facility] research group to see if she qualifies for the study. Otherwise would wait till she gets an orange card. She had 3 negative Hemoccult cards from 2012. She was given another set of hemoccult cards with today's visit.

## 2012-12-14 NOTE — Progress Notes (Signed)
Subjective:   [name]   DOB: 02/08/1956 57 y.o.   MRN: [id number]  HPI: 57 year old woman with past medical history significant for hypertension, morbid obesity presents to the clinic for a routine checkup.  Reports having some constant ringing in her ears for last few weeks. Denies any ear discharge, ear pain, hearing loss, fever, chills, nausea or vomiting or vertigo.  Her blood pressure was mildly elevated when she walked into the clinic. She states that she is running out of her atenolol for one week- did not have money to buy the medicine. Denies any headaches, blurry vision, weakness or numbness.  She is overweight and has understanding of her minimal physical activity and poor dietary habits which are probably attributing to her weight gain.  She is still working [employer]    Past Medical History  Diagnosis Date  . Hypertension    Family History  Problem Relation Age of Onset  . Hypertension Mother   . Stroke Mother 63  . COPD Father   . Hypertension Sister   . Hypertension Brother   . Cancer Brother     prostate cancer  . Diabetes Paternal Grandmother    History   Social History  . Marital Status: Divorced    Spouse Name: N/A    Number of Children: N/A  . Years of Education: N/A   Occupational History  . 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   Social History Main Topics  . Smoking status: Never Smoker   . Smokeless tobacco: Not on file  . Alcohol Use: Yes     Comment: Beer seldom  . Drug Use: No  . Sexually Active: Not on file   Other Topics Concern  . Not on file   Social History Narrative  . No narrative on file   Review of Systems: General: Denies fever, chills, diaphoresis, appetite change and fatigue. HEENT: Denies photophobia, eye pain, redness, hearing loss, ear pain, congestion, sore throat, rhinorrhea,  sneezing, mouth sores, trouble swallowing, neck pain, neck stiffness , + tinnitus. Respiratory: Denies SOB, DOE, cough, chest tightness, and wheezing. Cardiovascular: Denies to chest pain, palpitations and leg swelling. Gastrointestinal: Denies nausea, vomiting, abdominal pain, diarrhea, constipation, blood in stool and abdominal distention. Genitourinary: Denies dysuria, urgency, frequency, hematuria, flank pain and difficulty urinating. Musculoskeletal: Denies myalgias, back pain, joint swelling, arthralgias and gait problem.  Skin: Denies pallor, rash and wound. Neurological: Denies dizziness, seizures, syncope, weakness, light-headedness, numbness and headaches. Hematological: Denies adenopathy, easy bruising, personal or family bleeding history. Psychiatric/Behavioral: Denies suicidal ideation, mood changes, confusion, nervousness, sleep disturbance and agitation.    Current Outpatient Medications: Current Outpatient Prescriptions  Medication Sig Dispense Refill  . amLODipine (NORVASC) 5 MG tablet TAKE 1 TABLET BY MOUTH ONCE DAILY  30 tablet  3  . atenolol (TENORMIN) 50 MG tablet TAKE 1 TABLET BY MOUTH DAILY  30 tablet  6  . hydrochlorothiazide (HYDRODIURIL) 25 MG tablet TAKE 1 TABLET BY MOUTH DAILY.  30 tablet  3    Allergies: No Known Allergies    Objective:   Physical Exam: Filed Vitals:   12/14/12 0833  BP: 150/82  Pulse: 73  Temp: 96.9 F (36.1 C)    General: Vital signs reviewed and noted. Well-developed, well-nourished, morbidly obese, in no acute distress; alert, appropriate and cooperative throughout examination. HEENT: Normocephalic, atraumatic, right and left ear have small amount of  wax, TM appears intact. Small scab was noted on the pinna of left ear likely from some small boil being scratched. Lungs: Normal respiratory effort. Clear to auscultation BL without crackles or wheezes. Heart: RRR. S1 and S2 normal without gallop, murmur, or rubs. Abdomen:BS  normoactive. Soft, Nondistended, non-tender.  No masses or organomegaly. Extremities: No pretibial edema.     Assessment & Plan:

## 2012-12-14 NOTE — Patient Instructions (Addendum)
General Instructions: Please schedule a follow up appointment in 2-3 weeks . Please bring your medication bottles with your next appointment. Please take your medicines as prescribed. I will call you with your lab results if anything will be abnormal. Get a blood pressure log recording your BP every week with next visit.     Treatment Goals:  Goals (1 Years of Data) as of 12/14/2012    None      Progress Toward Treatment Goals:  Treatment Goal 12/14/2012  Blood pressure unchanged    Self Care Goals & Plans:  Self Care Goal 12/14/2012  Manage my medications take my medicines as prescribed; bring my medications to every visit; refill my medications on time  Eat healthy foods eat foods that are low in salt  Be physically active find an activity I enjoy       Care Management & Community Referrals:  Referral 12/14/2012  Referrals made for care management support none needed

## 2012-12-14 NOTE — Assessment & Plan Note (Signed)
Had lengthy discussion about dietary habits and exercise that would help in weight reduction. Like the previous visits, patient seems to be motivated for weight reduction. She was advised to avoid soda ,chips, fried food and enrolled into exercise programs like aerobics or zumba that would benefit her. She is willing to try.

## 2013-01-02 ENCOUNTER — Encounter: Payer: Self-pay | Admitting: Internal Medicine

## 2013-01-05 ENCOUNTER — Ambulatory Visit (INDEPENDENT_AMBULATORY_CARE_PROVIDER_SITE_OTHER): Payer: Self-pay | Admitting: Internal Medicine

## 2013-01-05 ENCOUNTER — Encounter: Payer: Self-pay | Admitting: Internal Medicine

## 2013-01-05 VITALS — BP 134/76 | HR 64 | Temp 97.0°F | Ht 65.0 in | Wt 350.5 lb

## 2013-01-05 DIAGNOSIS — M25473 Effusion, unspecified ankle: Secondary | ICD-10-CM | POA: Insufficient documentation

## 2013-01-05 NOTE — Assessment & Plan Note (Signed)
Her tinnitus has improved with discontinuation of Norvasc. Continue to monitor for now.

## 2013-01-05 NOTE — Assessment & Plan Note (Addendum)
She has gained 10 pounds from her last clinic visit in the setting of increased salt intake. On exam she was noted to have ankle edema. Differentials include venous insufficiency versus dietary indiscretions versus CHF. Renal disease seems less likely with stable creatinine. CHF seems less likely in the absence of any symptoms like chest pain, dyspnea on exertion, orthopnea. She has never had any echocardiogram. -Would check UA today to look for any proteinuria that might help to see if she has nephrotic syndrome. -Check TSH, CMET (  to check her albumin)with next appointment -She was advised to limit salt intake in her diet. -Leg elevation. -Compression stockings -May consider echo if needed to work up CHF, if indicated.
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
## 2013-01-05 NOTE — Assessment & Plan Note (Signed)
Patient has gained 10 pounds since her last clinic visit. - Check TSH with next visit.  - Also see the problem leg/ankle swelling

## 2013-01-05 NOTE — Patient Instructions (Addendum)
General Instructions: Please schedule a follow up appointment in 1-2 months . Please bring your medication bottles with your next appointment. Please take your medicines as prescribed. I will call you with your lab results if anything will be abnormal. Please limit salt in your diet. Leg elevation and please wear compression stockings.  Get BP log with next appt.     Treatment Goals:  Goals (1 Years of Data) as of 01/05/13         As of Today 12/14/12 12/21/11     Blood Pressure    . Blood Pressure < 140/90  134/76 150/82 144/85      Progress Toward Treatment Goals:  Treatment Goal 12/14/2012  Blood pressure unchanged    Self Care Goals & Plans:  Self Care Goal 01/05/2013  Manage my medications bring my medications to every visit  Eat healthy foods eat more vegetables  Be physically active find an activity I enjoy       Care Management & Community Referrals:  Referral 12/14/2012  Referrals made for care management support none needed

## 2013-01-05 NOTE — Assessment & Plan Note (Signed)
Blood pressure at goal. She does report some dizziness for last 2 weeks, usually when she turns her on rapidly but denies any vertigo, nausea, vomiting or falls associated with it. I would just monitor her symptoms on current medications.  - Continue hydrochlorothiazide and atenolol for now. -Return to clinic in one month for blood pressure recheck and followup on dizziness.

## 2013-01-06 LAB — URINALYSIS, ROUTINE W REFLEX MICROSCOPIC
Bilirubin Urine: NEGATIVE
Ketones, ur: NEGATIVE mg/dL
Leukocytes, UA: NEGATIVE
Protein, ur: NEGATIVE mg/dL
Specific Gravity, Urine: 1.014 (ref 1.005–1.030)
Urobilinogen, UA: 0.2 mg/dL (ref 0.0–1.0)

## 2013-01-17 ENCOUNTER — Other Ambulatory Visit (INDEPENDENT_AMBULATORY_CARE_PROVIDER_SITE_OTHER): Payer: Self-pay | Admitting: Internal Medicine

## 2013-01-17 LAB — POC HEMOCCULT BLD/STL (HOME/3-CARD/SCREEN): Fecal Occult Blood, POC: NEGATIVE

## 2013-02-08 ENCOUNTER — Ambulatory Visit (INDEPENDENT_AMBULATORY_CARE_PROVIDER_SITE_OTHER): Payer: Self-pay | Admitting: Internal Medicine

## 2013-02-08 ENCOUNTER — Encounter: Payer: Self-pay | Admitting: Internal Medicine

## 2013-02-08 VITALS — BP 144/82 | HR 61 | Temp 98.4°F | Ht 65.0 in | Wt 353.0 lb

## 2013-02-08 DIAGNOSIS — H9313 Tinnitus, bilateral: Secondary | ICD-10-CM

## 2013-02-08 DIAGNOSIS — M7989 Other specified soft tissue disorders: Secondary | ICD-10-CM

## 2013-02-08 DIAGNOSIS — H9319 Tinnitus, unspecified ear: Secondary | ICD-10-CM

## 2013-02-08 DIAGNOSIS — J309 Allergic rhinitis, unspecified: Secondary | ICD-10-CM

## 2013-02-08 DIAGNOSIS — R600 Localized edema: Secondary | ICD-10-CM | POA: Insufficient documentation

## 2013-02-08 DIAGNOSIS — I10 Essential (primary) hypertension: Secondary | ICD-10-CM

## 2013-02-08 MED ORDER — GUAIFENESIN ER 600 MG PO TB12: 600.0000 mg | ORAL_TABLET | Freq: Two times a day (BID) | ORAL | Status: AC

## 2013-02-08 MED ORDER — CETIRIZINE HCL 10 MG PO CAPS: 10.0000 mg | ORAL_CAPSULE | Freq: Every day | ORAL | Status: DC

## 2013-02-08 MED ORDER — FUROSEMIDE 20 MG PO TABS: 20.0000 mg | ORAL_TABLET | Freq: Every day | ORAL | Status: DC
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
## 2013-02-08 NOTE — Patient Instructions (Signed)
Stop taking the HCTZ. Start taking the Lasix 20mg  by mouth daily. Please return to clinic next week for a follow up.  I am sending you for an echocardiogram, which is an ultrasound of your heart. I am hoping to have these results back before your next clinic visit.

## 2013-02-09 LAB — COMPLETE METABOLIC PANEL WITH GFR
Albumin: 3.9 g/dL (ref 3.5–5.2)
BUN: 12 mg/dL (ref 6–23)
CO2: 29 mEq/L (ref 19–32)
Calcium: 8.9 mg/dL (ref 8.4–10.5)
Chloride: 100 mEq/L (ref 96–112)
GFR, Est African American: >89 mL/min
GFR, Est Non African American: >89 mL/min
Glucose, Bld: 81 mg/dL (ref 70–99)
Potassium: 4.6 mEq/L (ref 3.5–5.3)
Sodium: 136 mEq/L (ref 135–145)
Total Protein: 7 g/dL (ref 6.0–8.3)

## 2013-02-09 NOTE — Progress Notes (Signed)
Talked with Dr Sherrine Maples 5PM 02/08/13 - pt has no insurance - trying to get [employer]. Decided to wait till pt has insurance to do 2D echo. Pt aware and involved with discussion. Stanton Kidney Ozias Dicenzo RN  02/09/13 12N

## 2013-02-10 NOTE — Assessment & Plan Note (Addendum)
Pt with continued BLE, 3+ pitting. She has been on HCTZ without improvement of her symptoms. No JVD and lungs are clear to ausculation. However, no h/o of ECHO. EKG shows mild sinus bradycardia, otherwise normal.  - 2D ECHO (she is waiting for insurance which should begin this month and does not want to pay out of pocket) - TSH - CMP - D/c HCTZ, starting Lasix 20mg  po  Daily - F/u in 1 week

## 2013-02-10 NOTE — Assessment & Plan Note (Signed)
Thsi has greatly improved/resolved since her last clinic visit.

## 2013-02-10 NOTE — Assessment & Plan Note (Signed)
C/o seasonal allergies. Starting Zyrtec and Mucinex PRN.

## 2013-02-14 ENCOUNTER — Encounter: Payer: Self-pay | Admitting: Internal Medicine

## 2013-02-14 ENCOUNTER — Ambulatory Visit (INDEPENDENT_AMBULATORY_CARE_PROVIDER_SITE_OTHER): Payer: Self-pay | Admitting: Internal Medicine

## 2013-02-14 VITALS — BP 140/77 | HR 66 | Temp 98.4°F | Ht 65.0 in | Wt 351.0 lb

## 2013-02-14 DIAGNOSIS — I10 Essential (primary) hypertension: Secondary | ICD-10-CM

## 2013-02-14 DIAGNOSIS — M7989 Other specified soft tissue disorders: Secondary | ICD-10-CM

## 2013-02-14 NOTE — Patient Instructions (Addendum)
1.  Continue the medications as prescribed for now.  2.  Use the Myfitnesspal app to help track your intake.    3.  Follow up in about 1-2 months to see how you are doing.
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
## 2013-04-16 ENCOUNTER — Encounter: Payer: Self-pay | Admitting: Internal Medicine

## 2013-04-23 NOTE — Assessment & Plan Note (Signed)
Swelling has improved.  There is likely a component of venous insufficiency as well as the risk for CHF.  She has not finished the paperwork for her orange card so we are still holding on the scheduling of her 2D echo.  Creatinine is normal and her potassium is okay so we will continue her current dose of her medications for now.

## 2013-04-23 NOTE — Assessment & Plan Note (Signed)
She has struggle for sometime with her weight.  We discussed today several strategies for weight loss including weight watchers or using myfitnesspal.  She will start with myfitnesspal since it is free.  We discussed watching portion sizes and increasing her activity like walking 4-5 days per week.

## 2013-04-23 NOTE — Assessment & Plan Note (Signed)
BP Readings from Last 3 Encounters:  02/14/13 140/77  02/08/13 144/82  01/05/13 134/76   Lab Results  Component Value Date   NA 136 02/08/2013   K 4.6 02/08/2013   CREATININE 0.74 02/08/2013   Assessment: Blood pressure control: controlled Progress toward BP goal:  at goal Comments: She is taking her medications as prescribed and her BP is within her goal of <140/90.  Plan: Medications:  continue current medications Educational resources provided: brochure Self management tools provided: home blood pressure logbook Other plans:

## 2013-10-08 ENCOUNTER — Ambulatory Visit (INDEPENDENT_AMBULATORY_CARE_PROVIDER_SITE_OTHER): Payer: BC Managed Care – PPO | Admitting: Internal Medicine

## 2013-10-08 DIAGNOSIS — H6190 Disorder of external ear, unspecified, unspecified ear: Secondary | ICD-10-CM

## 2013-10-08 DIAGNOSIS — M25473 Effusion, unspecified ankle: Secondary | ICD-10-CM

## 2013-10-08 DIAGNOSIS — Z0000 Encounter for general adult medical examination without abnormal findings: Secondary | ICD-10-CM

## 2013-10-08 DIAGNOSIS — H6192 Disorder of left external ear, unspecified: Secondary | ICD-10-CM

## 2013-10-08 DIAGNOSIS — M7989 Other specified soft tissue disorders: Secondary | ICD-10-CM

## 2013-10-08 DIAGNOSIS — I10 Essential (primary) hypertension: Secondary | ICD-10-CM
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
## 2013-10-08 NOTE — Patient Instructions (Signed)
We will refer you to a dermatologist for your ear Please continue to take all of your medications as prescribed  We will schedule you for an echocardiogram of your heart

## 2013-10-09 ENCOUNTER — Encounter: Payer: Self-pay | Admitting: Internal Medicine

## 2013-10-09 DIAGNOSIS — H6192 Disorder of left external ear, unspecified: Secondary | ICD-10-CM | POA: Insufficient documentation

## 2013-10-09 NOTE — Assessment & Plan Note (Signed)
Pt continues to have b/l 2+ pitting edema on exam.  She reports mild SOB with exertion when walking 1/4 of the way around a track.  She denies any CP, orthopnea, or PND.  She reports being on her feet all day.  Pt is unsure if she is taking HCTZ or lasix.    - check echocardiogram since pt now has insurance - continue lasix (will call pharmacy to verify this is what she is taking) - continue to elevate legs above heart when possible - TSH was checked and wnl; LFTs wnl  Lab Results  Component Value Date   ALT 18 02/08/2013   AST 19 02/08/2013   ALKPHOS 58 02/08/2013   BILITOT 0.3 02/08/2013    Lab Results  Component Value Date   TSH 1.655 02/08/2013

## 2013-10-09 NOTE — Assessment & Plan Note (Signed)
BP Readings from Last 3 Encounters:  02/14/13 140/77  02/08/13 144/82  01/05/13 134/76    Lab Results  Component Value Date   NA 136 02/08/2013   K 4.6 02/08/2013   CREATININE 0.74 02/08/2013    Assessment: Blood pressure control: mildly elevated Progress toward BP goal:  improved Comments: pt is unsure if she is taking HCTZ or furosemide; she is taking furosemide according to her med list On exam, pt does have b/l 2+ pitting edema without any other cardiovascular signs.    Plan: Medications:  continue current medications(lasix 20mg ; atenolol 100mg ) Self management tools provided:  asked pt to bring all of her medications to her future visits Other plans: check BMP today since pt is on furosemide; schedule pt for an echocardiogram to check for any abnormalities that could account for the LE edema

## 2013-10-09 NOTE — Progress Notes (Signed)
I saw and evaluated the patient.  I personally confirmed the key portions of the history and exam documented by Dr. Gill and I reviewed pertinent patient test results.  The assessment, diagnosis, and plan were formulated together and I agree with the documentation in the resident's note. 

## 2013-10-09 NOTE — Assessment & Plan Note (Signed)
Pt was offered an influenza vaccine but refused.    -ask pt at next visit about colonoscopy since she now has insurance; she does FOBT annually -ask at next visit about pap smear -mammography is UTD

## 2013-10-09 NOTE — Assessment & Plan Note (Signed)
Pt has intentionally lost weight since her previous visit.  She is using a physical fitness application to track her progress.   -congratulated pt on weight loss and encouraged her to continue to eat healthy

## 2013-10-09 NOTE — Assessment & Plan Note (Addendum)
Pt has a 2 cm X 1 cm pruritic violaceous lesion on her left external ear concha that has been present for over one year.  She states it has increased in size over the past several months and will not heal.  She has applied an ointment in the past but is unsure of the name but it has not helped.  Pt is unsure if she had a nevus there or not.  She has experienced atopic dermatitis in the past which is a possibility.  However, given the length of time the lesion has been there and will not heal is concerning.    -referral to dermatology

## 2014-01-28 ENCOUNTER — Ambulatory Visit: Payer: BC Managed Care – PPO | Admitting: Internal Medicine

## 2014-01-29 ENCOUNTER — Ambulatory Visit: Payer: BC Managed Care – PPO | Admitting: Internal Medicine

## 2014-01-30 ENCOUNTER — Encounter: Payer: Self-pay | Admitting: Internal Medicine

## 2014-02-13 ENCOUNTER — Other Ambulatory Visit: Payer: Self-pay | Admitting: *Deleted

## 2014-02-13 DIAGNOSIS — M7989 Other specified soft tissue disorders: Secondary | ICD-10-CM

## 2014-02-13 DIAGNOSIS — I10 Essential (primary) hypertension: Secondary | ICD-10-CM

## 2014-02-14 MED ORDER — FUROSEMIDE 20 MG PO TABS: 20.0000 mg | ORAL_TABLET | Freq: Every day | ORAL | Status: DC

## 2014-02-14 MED ORDER — ATENOLOL 100 MG PO TABS: 100.0000 mg | ORAL_TABLET | Freq: Every day | ORAL | Status: DC

## 2014-03-18 ENCOUNTER — Encounter: Payer: Self-pay | Admitting: Internal Medicine

## 2014-03-18 ENCOUNTER — Telehealth: Payer: Self-pay | Admitting: *Deleted

## 2014-03-18 ENCOUNTER — Ambulatory Visit (INDEPENDENT_AMBULATORY_CARE_PROVIDER_SITE_OTHER): Payer: No Typology Code available for payment source | Admitting: Internal Medicine

## 2014-03-18 VITALS — BP 153/77 | HR 69 | Temp 98.6°F | Wt 348.4 lb

## 2014-03-18 DIAGNOSIS — M7989 Other specified soft tissue disorders: Secondary | ICD-10-CM

## 2014-03-18 DIAGNOSIS — I10 Essential (primary) hypertension: Secondary | ICD-10-CM

## 2014-03-18 LAB — BASIC METABOLIC PANEL WITH GFR
BUN: 10 mg/dL (ref 6–23)
CHLORIDE: 101 meq/L (ref 96–112)
CO2: 30 mEq/L (ref 19–32)
Calcium: 8.9 mg/dL (ref 8.4–10.5)
Creat: 0.69 mg/dL (ref 0.50–1.10)
GLUCOSE: 126 mg/dL — AB (ref 70–99)
POTASSIUM: 3.9 meq/L (ref 3.5–5.3)
SODIUM: 141 meq/L (ref 135–145)

## 2014-03-18 LAB — PRO B NATRIURETIC PEPTIDE: Pro B Natriuretic peptide (BNP): 302 pg/mL — ABNORMAL HIGH (ref <126)

## 2014-03-18 MED ORDER — FUROSEMIDE 20 MG PO TABS: ORAL_TABLET | ORAL | Status: DC
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
## 2014-03-18 NOTE — Assessment & Plan Note (Signed)
Chronic Bilateral swelling of legs in a morbidly obese, hypertensive patient. Patient is clinically stable and no symptoms/signs of acute distress. Differentials include : Congestive Heart failure vs Chronic dependant edema vs Renal Insufficiency vs Venous Insufficiency. Discussed with the attending regarding further management.  Plans: 2D Echocardiogram to evaluate LV function.( Patient reports asking family for financial help to get this done). Increase Lasix to 40 mg qd. Check BMP. Check BNP. Follow up with PCP next month or as needed if symptoms get worse. Recommended salt, water restriction along with lower limb elevation for 30 minutes 3-4 times/day. Consider adding low dose ACEI empirically if the symptoms persist/worsen and if the 2D Echo is not done.

## 2014-03-18 NOTE — Telephone Encounter (Signed)
Since last appt c/o of SOB and sharp pains with breathing - getting worse. Appt made for today 1:15PM with Dr Eyvonne Mechanic. Hilda Blades Talayia Hjort RN 03/18/14 10AM

## 2014-03-18 NOTE — Patient Instructions (Signed)
Start taking Lasix 2 tablets by mouth once daily. If your symptoms worsen or persist, please inform us or seek medical help. Eat heart healthy foods. Choose foods that are without trans fat and are low in saturated fat, cholesterol, and salt (sodium). This includes fresh or frozen fruits and vegetables, fish, lean meats, fat-free or low-fat dairy foods, whole grains, and high-fiber foods. Lentils and dried peas and beans (legumes) are also good choices. Limit salt intake to less than 1-2 grams per a day. Cook in a healthy way. Roast, grill, broil, bake, poach, steam, or stir-fry foods. Limit fluids to 1.5 liter per a day.  GET HELP RIGHT AWAY IF:   You have trouble breathing.  There is a change in mental status, such as becoming less alert or not being able to focus.  You have chest pain or discomfort.  You faint. MAKE SURE YOU:   Understand these instructions.  Will watch your condition.  Will get help right away if you are not doing well or get worse.

## 2014-03-18 NOTE — Assessment & Plan Note (Signed)
BP's elevated and not at goal.  Plans: Increase Lasix to 40 mg qd.  See A/P for leg swelling. Check BMP. Follow up with PCP next month.

## 2014-03-19 NOTE — Progress Notes (Signed)
Case discussed with Dr. Boggala at the time of the visit.  We reviewed the resident's history and exam and pertinent patient test results.  I agree with the assessment, diagnosis and plan of care documented in the resident's note. 

## 2014-04-08 ENCOUNTER — Encounter: Payer: BC Managed Care – PPO | Admitting: Internal Medicine

## 2014-04-15 ENCOUNTER — Encounter: Payer: BC Managed Care – PPO | Admitting: Internal Medicine

## 2014-05-05 ENCOUNTER — Encounter: Payer: Self-pay | Admitting: Internal Medicine

## 2014-05-06 ENCOUNTER — Encounter: Payer: Self-pay | Admitting: Internal Medicine

## 2014-05-06 ENCOUNTER — Ambulatory Visit (INDEPENDENT_AMBULATORY_CARE_PROVIDER_SITE_OTHER): Payer: No Typology Code available for payment source | Admitting: Internal Medicine

## 2014-05-06 VITALS — BP 152/82 | HR 62 | Temp 98.1°F | Ht 65.0 in | Wt 342.9 lb

## 2014-05-06 DIAGNOSIS — M25473 Effusion, unspecified ankle: Secondary | ICD-10-CM

## 2014-05-06 DIAGNOSIS — R609 Edema, unspecified: Secondary | ICD-10-CM

## 2014-05-06 DIAGNOSIS — Z0000 Encounter for general adult medical examination without abnormal findings: Secondary | ICD-10-CM

## 2014-05-06 DIAGNOSIS — M7989 Other specified soft tissue disorders: Secondary | ICD-10-CM

## 2014-05-06 DIAGNOSIS — R600 Localized edema: Secondary | ICD-10-CM

## 2014-05-06 DIAGNOSIS — I10 Essential (primary) hypertension: Secondary | ICD-10-CM

## 2014-05-06 MED ORDER — LISINOPRIL 5 MG PO TABS: 5.0000 mg | ORAL_TABLET | Freq: Every day | ORAL | Status: DC

## 2014-05-06 NOTE — Patient Instructions (Signed)
Thank you for your visit today.   Please return to the internal medicine clinic in 1 month(s) or sooner if needed.     Your current medical regimen is effective;  continue present plan and take all medications as prescribed.    I have made the following additions/changes to your medications: Add lisinopril 5mg  daily for blood pressure.   I have sent your medication refills to your pharmacy.   You need the following test(s) for regular health maintenance: Pap smear, mammogram, colonoscopy.   Please be sure to bring all of your medications with you to every visit; this includes herbal supplements, vitamins, eye drops, and any over-the-counter medications.   Should you have any questions regarding your medications and/or any new or worsening symptoms, please be sure to call the clinic at [phone].   If you believe that you are suffering from a life threatening condition or one that may result in the loss of limb or function, then you should call 911 or proceed to the nearest Emergency Department.     A healthy lifestyle and preventative care can promote health and wellness.   Maintain regular health, dental, and eye exams.  Eat a healthy diet. Foods like vegetables, fruits, whole grains, low-fat dairy products, and lean protein foods contain the nutrients you need without too many calories. Decrease your intake of foods high in solid fats, added sugars, and salt. Get information about a proper diet from your caregiver, if necessary.  Regular physical exercise is one of the most important things you can do for your health. Most adults should get at least 150 minutes of moderate-intensity exercise (any activity that increases your heart rate and causes you to sweat) each week. In addition, most adults need muscle-strengthening exercises on 2 or more days a week.   Maintain a healthy weight. The body mass index (BMI) is a screening tool to identify possible weight problems. It provides  an estimate of body fat based on height and weight. Your caregiver can help determine your BMI, and can help you achieve or maintain a healthy weight. For adults 20 years and older:  A BMI below 18.5 is considered underweight.  A BMI of 18.5 to 24.9 is normal.  A BMI of 25 to 29.9 is considered overweight.  A BMI of 30 and above is considered obese.
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
## 2014-05-07 LAB — BASIC METABOLIC PANEL WITH GFR
BUN: 10 mg/dL (ref 6–23)
CALCIUM: 9.1 mg/dL (ref 8.4–10.5)
CO2: 27 meq/L (ref 19–32)
Chloride: 101 mEq/L (ref 96–112)
Creat: 0.74 mg/dL (ref 0.50–1.10)
GFR, Est African American: >89 mL/min
GFR, Est Non African American: >89 mL/min
Glucose, Bld: 88 mg/dL (ref 70–99)
Potassium: 4.1 mEq/L (ref 3.5–5.3)
Sodium: 139 mEq/L (ref 135–145)

## 2014-05-08 NOTE — Assessment & Plan Note (Signed)
Stressed weight loss, of note she has lost 9 lbs since 02/2013--I congratulated her on this excellent progress. Wt Readings from Last 3 Encounters:  05/06/14 342 lb 14.4 oz (155.538 kg)  03/18/14 348 lb 6.4 oz (158.033 kg)  02/14/13 351 lb (159.213 kg)

## 2014-05-08 NOTE — Assessment & Plan Note (Addendum)
BP Readings from Last 3 Encounters:  05/06/14 152/82  03/18/14 153/77  02/14/13 140/77    Lab Results  Component Value Date   NA 139 05/06/2014   K 4.1 05/06/2014   CREATININE 0.74 05/06/2014    Assessment: Blood pressure control: mildly elevated Progress toward BP goal:  deteriorated Comments: BP has been trending up, denies missing any doses of her meds  Plan: Medications:  continue current medications of furosemide 40mg  (increased last OV d/t LE edema), atenolol 100mg , and add lisinopril 5mg  daily    Other plans: Echo ordered but pt having difficulty with insurance coverage (will have Mamie check on this), check BMP today, BNP mildly elevated (302 on 03/18/14).

## 2014-05-08 NOTE — Assessment & Plan Note (Addendum)
Pt reports bilateral LE edema has improved.  Discussed with her limiting sodium intake to 2000mg /day, elevated her legs, continuing furosemide, and additionally purchasing a pair of compression stockings (mentioned Futuro energizing knee highs that were relatively inexpensive).

## 2014-05-08 NOTE — Assessment & Plan Note (Signed)
Pt in the process of finding out if insurance will cover mammogram and colonoscopy.

## 2014-05-09 NOTE — Progress Notes (Signed)
INTERNAL MEDICINE TEACHING ATTENDING ADDENDUM - Aldine Contes, MD: I reviewed and discussed at the time of visit with the resident Dr. Gordy Levan, the patient's medical history, physical examination, diagnosis and results of tests and treatment and I agree with the patient's care as documented.

## 2014-06-03 ENCOUNTER — Encounter: Payer: No Typology Code available for payment source | Admitting: Internal Medicine

## 2014-06-07 ENCOUNTER — Telehealth: Payer: Self-pay | Admitting: *Deleted

## 2014-06-07 NOTE — Telephone Encounter (Signed)
SPOKE WITH MS. Heffler. GAVE HER INFORMATION FOR ECHO APPOINTMENT. AUGUST 4, 015 @ 11:00AM WITH 10:45AM ARRIVE AT [facility]  CHECK IN. TO CALL [phone] IF NEED TO CHANGE OR CANCEL THIS APPOINNTMENT.  Shavonne Ambroise NTII [phone]

## 2014-06-10 ENCOUNTER — Ambulatory Visit (INDEPENDENT_AMBULATORY_CARE_PROVIDER_SITE_OTHER): Payer: No Typology Code available for payment source | Admitting: Internal Medicine

## 2014-06-10 ENCOUNTER — Encounter: Payer: Self-pay | Admitting: Internal Medicine

## 2014-06-10 VITALS — BP 152/85 | HR 58 | Temp 97.7°F

## 2014-06-10 DIAGNOSIS — Z0000 Encounter for general adult medical examination without abnormal findings: Secondary | ICD-10-CM

## 2014-06-10 DIAGNOSIS — H6192 Disorder of left external ear, unspecified: Secondary | ICD-10-CM

## 2014-06-10 DIAGNOSIS — I10 Essential (primary) hypertension: Secondary | ICD-10-CM

## 2014-06-10 DIAGNOSIS — R609 Edema, unspecified: Secondary | ICD-10-CM

## 2014-06-10 DIAGNOSIS — R600 Localized edema: Secondary | ICD-10-CM

## 2014-06-10 LAB — BASIC METABOLIC PANEL WITH GFR
BUN: 10 mg/dL (ref 6–23)
CO2: 30 mEq/L (ref 19–32)
Calcium: 8.7 mg/dL (ref 8.4–10.5)
Chloride: 101 mEq/L (ref 96–112)
Creat: 0.71 mg/dL (ref 0.50–1.10)
GLUCOSE: 71 mg/dL (ref 70–99)
POTASSIUM: 3.9 meq/L (ref 3.5–5.3)
SODIUM: 141 meq/L (ref 135–145)

## 2014-06-10 MED ORDER — LISINOPRIL 10 MG PO TABS: 10.0000 mg | ORAL_TABLET | Freq: Every day | ORAL | Status: DC

## 2014-06-10 NOTE — Assessment & Plan Note (Signed)
-  pt given info for mammogram scholarship -will check on insurance for next visit in terms of getting colonoscopy -in the meantime, have asked pt to go ahead and repeat hemoccult cards and send in

## 2014-06-10 NOTE — Assessment & Plan Note (Signed)
-  check TSH -encouraged weight loss and exercise
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
## 2014-06-10 NOTE — Patient Instructions (Signed)
Thank you for your visit today.   Please return to the internal medicine clinic in 1 month(s) or sooner if needed for a BP recheck.    Your current medical regimen is effective;  continue present plan and take all medications as prescribed.    I have made the following additions/changes to your medications: increased lisinopril to 10mg  daily.   I have sent your medication refills to your pharmacy.   I have made the following referrals for you: None  You need the following test(s) for regular health maintenance: Colonoscopy, mammogram, pap smear.   Please be sure to bring all of your medications with you to every visit; this includes herbal supplements, vitamins, eye drops, and any over-the-counter medications.   Should you have any questions regarding your medications and/or any new or worsening symptoms, please be sure to call the clinic at [phone].   If you believe that you are suffering from a life threatening condition or one that may result in the loss of limb or function, then you should call 911 or proceed to the nearest Emergency Department.     A healthy lifestyle and preventative care can promote health and wellness.   Maintain regular health, dental, and eye exams.  Eat a healthy diet. Foods like vegetables, fruits, whole grains, low-fat dairy products, and lean protein foods contain the nutrients you need without too many calories. Decrease your intake of foods high in solid fats, added sugars, and salt. Get information about a proper diet from your caregiver, if necessary.  Regular physical exercise is one of the most important things you can do for your health. Most adults should get at least 150 minutes of moderate-intensity exercise (any activity that increases your heart rate and causes you to sweat) each week. In addition, most adults need muscle-strengthening exercises on 2 or more days a week.   Maintain a healthy weight. The body mass index (BMI) is a  screening tool to identify possible weight problems. It provides an estimate of body fat based on height and weight. Your caregiver can help determine your BMI, and can help you achieve or maintain a healthy weight. For adults 20 years and older:  A BMI below 18.5 is considered underweight.  A BMI of 18.5 to 24.9 is normal.  A BMI of 25 to 29.9 is considered overweight.  A BMI of 30 and above is considered obese.
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
## 2014-06-10 NOTE — Assessment & Plan Note (Addendum)
-  continue furosemide -continue leg elevation, advise to get compression socks at last OV but pt needs to get a larger size (will check on this at next visit)

## 2014-06-11 ENCOUNTER — Ambulatory Visit (HOSPITAL_COMMUNITY)
Admission: RE | Admit: 2014-06-11 | Discharge: 2014-06-11 | Disposition: A | Discharge status: Home / Self Care | Payer: No Typology Code available for payment source | Source: Ambulatory Visit | Attending: Internal Medicine | Admitting: Internal Medicine

## 2014-06-11 DIAGNOSIS — I517 Cardiomegaly: Secondary | ICD-10-CM

## 2014-06-11 DIAGNOSIS — M6289 Other specified disorders of muscle: Secondary | ICD-10-CM | POA: Insufficient documentation

## 2014-06-11 DIAGNOSIS — M629 Disorder of muscle, unspecified: Secondary | ICD-10-CM | POA: Insufficient documentation

## 2014-06-11 DIAGNOSIS — M7989 Other specified soft tissue disorders: Secondary | ICD-10-CM

## 2014-06-11 DIAGNOSIS — I10 Essential (primary) hypertension: Secondary | ICD-10-CM | POA: Insufficient documentation

## 2014-06-11 LAB — TSH: TSH: 1.272 u[IU]/mL (ref 0.350–4.500)

## 2014-06-11 NOTE — Progress Notes (Signed)
  Echocardiogram 2D Echocardiogram has been performed.  Darlina Sicilian M 06/11/2014, 1:02 PM

## 2014-06-11 NOTE — Progress Notes (Signed)
INTERNAL MEDICINE TEACHING ATTENDING ADDENDUM - Jahzier Villalon, MD: I reviewed and discussed at the time of visit with the resident Dr. Gill, the patient's medical history, physical examination, diagnosis and results of pertinent tests and treatment and I agree with the patient's care as documented.  

## 2014-07-09 ENCOUNTER — Other Ambulatory Visit: Payer: Self-pay | Admitting: Internal Medicine

## 2014-07-09 ENCOUNTER — Other Ambulatory Visit: Payer: Self-pay | Admitting: *Deleted

## 2014-07-09 DIAGNOSIS — I10 Essential (primary) hypertension: Secondary | ICD-10-CM

## 2014-07-09 MED ORDER — LISINOPRIL 10 MG PO TABS: 10.0000 mg | ORAL_TABLET | Freq: Every day | ORAL | Status: DC

## 2014-07-09 NOTE — Telephone Encounter (Signed)
Patient needs to come in and have labs this week to check kidney function.  Thanks!

## 2014-07-11 ENCOUNTER — Other Ambulatory Visit: Payer: No Typology Code available for payment source

## 2014-07-12 ENCOUNTER — Other Ambulatory Visit (INDEPENDENT_AMBULATORY_CARE_PROVIDER_SITE_OTHER): Payer: No Typology Code available for payment source

## 2014-07-12 DIAGNOSIS — I10 Essential (primary) hypertension: Secondary | ICD-10-CM

## 2014-07-12 LAB — BASIC METABOLIC PANEL WITH GFR
BUN: 9 mg/dL (ref 6–23)
CO2: 26 mEq/L (ref 19–32)
Calcium: 9 mg/dL (ref 8.4–10.5)
Chloride: 105 mEq/L (ref 96–112)
Creat: 0.76 mg/dL (ref 0.50–1.10)
GFR, EST NON AFRICAN AMERICAN: 89 mL/min
GLUCOSE: 165 mg/dL — AB (ref 70–99)
Potassium: 3.9 mEq/L (ref 3.5–5.3)
SODIUM: 141 meq/L (ref 135–145)

## 2014-07-15 ENCOUNTER — Encounter: Payer: Self-pay | Admitting: Internal Medicine

## 2014-07-20 ENCOUNTER — Encounter: Payer: Self-pay | Admitting: Internal Medicine

## 2014-07-22 ENCOUNTER — Ambulatory Visit (HOSPITAL_COMMUNITY)
Admission: RE | Admit: 2014-07-22 | Discharge: 2014-07-22 | Disposition: A | Discharge status: Home / Self Care | Payer: No Typology Code available for payment source | Source: Ambulatory Visit | Attending: Internal Medicine | Admitting: Internal Medicine

## 2014-07-22 ENCOUNTER — Ambulatory Visit (INDEPENDENT_AMBULATORY_CARE_PROVIDER_SITE_OTHER): Payer: No Typology Code available for payment source | Admitting: Internal Medicine

## 2014-07-22 ENCOUNTER — Encounter: Payer: Self-pay | Admitting: Internal Medicine

## 2014-07-22 VITALS — BP 148/69 | HR 63 | Temp 98.3°F | Wt 347.7 lb

## 2014-07-22 DIAGNOSIS — Z0000 Encounter for general adult medical examination without abnormal findings: Secondary | ICD-10-CM

## 2014-07-22 DIAGNOSIS — I10 Essential (primary) hypertension: Secondary | ICD-10-CM

## 2014-07-22 DIAGNOSIS — E785 Hyperlipidemia, unspecified: Secondary | ICD-10-CM

## 2014-07-22 DIAGNOSIS — R7309 Other abnormal glucose: Secondary | ICD-10-CM

## 2014-07-22 DIAGNOSIS — R931 Abnormal findings on diagnostic imaging of heart and coronary circulation: Secondary | ICD-10-CM

## 2014-07-22 DIAGNOSIS — Z8679 Personal history of other diseases of the circulatory system: Secondary | ICD-10-CM | POA: Insufficient documentation

## 2014-07-22 DIAGNOSIS — R609 Edema, unspecified: Secondary | ICD-10-CM

## 2014-07-22 DIAGNOSIS — R7303 Prediabetes: Secondary | ICD-10-CM

## 2014-07-22 DIAGNOSIS — I517 Cardiomegaly: Secondary | ICD-10-CM

## 2014-07-22 DIAGNOSIS — R600 Localized edema: Secondary | ICD-10-CM

## 2014-07-22 LAB — LIPID PANEL
Cholesterol: 201 mg/dL — ABNORMAL HIGH (ref 0–200)
HDL: 79 mg/dL (ref >39)
LDL CALC: 110 mg/dL — AB (ref 0–99)
TRIGLYCERIDES: 59 mg/dL (ref <150)
Total CHOL/HDL Ratio: 2.5 Ratio
VLDL: 12 mg/dL (ref 0–40)

## 2014-07-22 LAB — GLUCOSE, CAPILLARY: Glucose-Capillary: 80 mg/dL (ref 70–99)

## 2014-07-22 LAB — POCT GLYCOSYLATED HEMOGLOBIN (HGB A1C): Hemoglobin A1C: 6

## 2014-07-22 MED ORDER — METFORMIN HCL 850 MG PO TABS: 850.0000 mg | ORAL_TABLET | Freq: Every day | ORAL | Status: DC

## 2014-07-22 MED ORDER — ATENOLOL 50 MG PO TABS: 50.0000 mg | ORAL_TABLET | Freq: Every day | ORAL | Status: DC

## 2014-07-22 MED ORDER — ROSUVASTATIN CALCIUM 10 MG PO TABS: 10.0000 mg | ORAL_TABLET | Freq: Every day | ORAL | Status: DC

## 2014-07-22 MED ORDER — LISINOPRIL-HYDROCHLOROTHIAZIDE 10-12.5 MG PO TABS: 1.0000 | ORAL_TABLET | Freq: Every day | ORAL | Status: DC

## 2014-07-22 NOTE — Assessment & Plan Note (Addendum)
Pt has not been able to control her weight based on diet and reports having exertional chest pain unable to exercise.  TSH wnl.  -referral to [facility]bera Lat, CDE for nutrition education  -encouraged weight loss -refer to cardiology for chest pain (pt may need stress test) -check HA1c

## 2014-07-22 NOTE — Assessment & Plan Note (Addendum)
HA1c today is 6.0.   -will start metformin 850mg  daily since doubtful that patient will be able to lose weight and has difficulty exercising (gets SOB and CP with exertion) -will discuss bariatric surgery at next visit -referral to [facility], CDE

## 2014-07-22 NOTE — Assessment & Plan Note (Addendum)
Pt never been on a statin. -will check lipid panel today  -check HA1c -will start on moderate intensity statin today

## 2014-07-22 NOTE — Patient Instructions (Signed)
Thank you for your visit today.  Please return to the internal medicine clinic in 1 month(s) or sooner if needed.     Your current medical regimen is effective;  continue present plan and take all medications as prescribed.    I have made the following additions/changes to your medications:  1. discontinue furosemide (lasix) 2. decrease atenolol to 50mg  daily  3. add lisinopril 10mg --HCTZ 12.5mg   4. referral to CDE  Please be sure to bring all of your medications with you to every visit; this includes herbal supplements, vitamins, eye drops, and any over-the-counter medications.   Should you have any questions regarding your medications and/or any new or worsening symptoms, please be sure to call the clinic at [phone].   If you believe that you are suffering from a life threatening condition or one that may result in the loss of limb or function, then you should call 911 or proceed to the nearest Emergency Department.     A healthy lifestyle and preventative care can promote health and wellness.   Maintain regular health, dental, and eye exams.  Eat a healthy diet. Foods like vegetables, fruits, whole grains, low-fat dairy products, and lean protein foods contain the nutrients you need without too many calories. Decrease your intake of foods high in solid fats, added sugars, and salt. Get information about a proper diet from your caregiver, if necessary.  Regular physical exercise is one of the most important things you can do for your health. Most adults should get at least 150 minutes of moderate-intensity exercise (any activity that increases your heart rate and causes you to sweat) each week. In addition, most adults need muscle-strengthening exercises on 2 or more days a week.   Maintain a healthy weight. The body mass index (BMI) is a screening tool to identify possible weight problems. It provides an estimate of body fat based on height and weight. Your caregiver can help  determine your BMI, and can help you achieve or maintain a healthy weight. For adults 20 years and older:  A BMI below 18.5 is considered underweight.  A BMI of 18.5 to 24.9 is normal.  A BMI of 25 to 29.9 is considered overweight.  A BMI of 30 and above is considered obese.

## 2014-07-22 NOTE — Assessment & Plan Note (Addendum)
-  declined influenza vaccine -provide info for mammogram scholarship -will remind to bring in hemoccult cards  -reminded to get pap smear, will refer to gynecology next visit -will discuss bariatric surgery interest at next visit and give info if interested

## 2014-07-22 NOTE — Assessment & Plan Note (Signed)
Improved. -will d/c lasix 20mg  daily and add lisinopril/HCTZ -continue to keep feet elevated and decrease salt intake (limit to 2000mg  daily), encouraged to continue to use compression socks
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
## 2014-07-22 NOTE — Assessment & Plan Note (Addendum)
BP Readings from Last 3 Encounters:  07/22/14 148/69  06/10/14 152/85  05/06/14 152/82    Lab Results  Component Value Date   NA 141 07/12/2014   K 3.9 07/12/2014   CREATININE 0.76 07/12/2014    Assessment: Blood pressure control: mildly elevated Progress toward BP goal:  improved Comments: pt has been taking lasix 20mg , atenolol 100mg  (for migraine prophylaxis), and lisinopril 10mg  TTE shows mild LVH and moderate LA enlargement.  Pt also reports exertional CP without EKG changes. BNP mildly elevated.   Plan: Medications:  will d/c lasix (put on lasix d/t LE edema), decrease atenolol to 50mg  (sinus bradycardia on EKG), add lisinopril/HCTZ 10-12.5mg  today  Other plans: encouraged weight loss, will refer to [facility] for nutrition, refer to cards for chest pain, follow-up in 1 month to recheck BP since starting combo pill and d/c lasix and reducing atenolol

## 2014-07-22 NOTE — Assessment & Plan Note (Addendum)
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  Upon further questioning she states that she gets short winded when carrying heavy items and has occasional exertional CP that occurs mainly when she is carrying heavy items from her car to the building.  She describes the pain as "heavy" and subsides when she ceases physical exertion.  Denies any associated dizziness, lightheadedness, N/V.  Prior EKG reveals sinus bradycardia.  Recent TTE shows mild LVH and moderate left atrial enlargement.  I told her I would prefer that she see cardiology if she is having chest pain with exertion as she may need a stress test.  EKG done today is without any acute changes to suggest ischemia.   -will refer to cardiology given echo results and exertional chest pain -reminded her to go to the ED if she feels worsening chest pain  -continue current meds

## 2014-07-24 NOTE — Progress Notes (Signed)
Internal Medicine Clinic Attending Date of visit: 07/22/2014   Case discussed with Dr. Gordy Levan soon after the resident saw the patient.  We reviewed the resident's history and exam and pertinent patient test results.  I agree with the assessment, diagnosis, and plan of care documented in the resident's note.

## 2014-07-29 ENCOUNTER — Other Ambulatory Visit: Payer: Self-pay | Admitting: Internal Medicine

## 2014-07-29 ENCOUNTER — Encounter: Payer: Self-pay | Admitting: Internal Medicine

## 2014-08-06 LAB — HEMOCCULT SLIDES (X 3 CARDS)

## 2014-08-13 ENCOUNTER — Telehealth: Payer: Self-pay | Admitting: *Deleted

## 2014-08-13 NOTE — Telephone Encounter (Signed)
Call from pt stating she has been unable to obtain her crestor rx because it needs a prior authorization from her insurance company.  Contacted pt's insurance and was informed that patient will need to try and fail atleast two of the following preferred medications, unless contraindicated: atorvastatin, lovastatin, simvastatin, and/or pravastatin.  Will send to MD for review, please advise.Marland KitchenMarland KitchenDespina Hidden Cassady10/6/20151:15 PM        [place] ID# [id number]

## 2014-08-19 ENCOUNTER — Encounter: Payer: No Typology Code available for payment source | Admitting: Dietician

## 2014-08-19 ENCOUNTER — Encounter: Payer: Self-pay | Admitting: Internal Medicine

## 2014-08-19 ENCOUNTER — Other Ambulatory Visit: Payer: Self-pay | Admitting: Internal Medicine

## 2014-08-19 MED ORDER — ATORVASTATIN CALCIUM 10 MG PO TABS: 10.0000 mg | ORAL_TABLET | Freq: Every day | ORAL | Status: DC

## 2014-08-19 NOTE — Telephone Encounter (Signed)
Sent atorvastatin to [facility].  Thanks.

## 2014-08-26 ENCOUNTER — Other Ambulatory Visit: Payer: Self-pay | Admitting: Internal Medicine

## 2014-08-26 ENCOUNTER — Other Ambulatory Visit: Payer: Self-pay | Admitting: *Deleted

## 2014-08-26 DIAGNOSIS — R7303 Prediabetes: Secondary | ICD-10-CM

## 2014-08-26 DIAGNOSIS — I10 Essential (primary) hypertension: Secondary | ICD-10-CM

## 2014-08-26 MED ORDER — LISINOPRIL-HYDROCHLOROTHIAZIDE 10-12.5 MG PO TABS: 1.0000 | ORAL_TABLET | Freq: Every day | ORAL | Status: DC

## 2014-08-26 MED ORDER — METFORMIN HCL 850 MG PO TABS: 850.0000 mg | ORAL_TABLET | Freq: Every day | ORAL | Status: DC

## 2014-08-26 MED ORDER — ATENOLOL 50 MG PO TABS: 50.0000 mg | ORAL_TABLET | Freq: Every day | ORAL | Status: DC

## 2014-08-26 NOTE — Telephone Encounter (Signed)
States she ran out of medication over the week-in. Has an appt Wed 10/21.

## 2014-08-28 ENCOUNTER — Encounter: Payer: No Typology Code available for payment source | Admitting: Internal Medicine

## 2014-08-28 ENCOUNTER — Encounter: Payer: Self-pay | Admitting: Internal Medicine

## 2014-09-23 ENCOUNTER — Encounter: Payer: No Typology Code available for payment source | Admitting: Dietician

## 2014-10-21 ENCOUNTER — Other Ambulatory Visit: Payer: Self-pay | Admitting: Internal Medicine

## 2014-10-23 NOTE — Addendum Note (Signed)
Addended by: Hulan Fray on: 10/23/2014 06:49 PM   Modules accepted: Orders

## 2014-12-12 ENCOUNTER — Encounter: Payer: Self-pay | Admitting: *Deleted

## 2014-12-24 ENCOUNTER — Other Ambulatory Visit: Payer: Self-pay | Admitting: Internal Medicine

## 2014-12-30 ENCOUNTER — Encounter: Payer: No Typology Code available for payment source | Admitting: Internal Medicine

## 2014-12-30 ENCOUNTER — Other Ambulatory Visit: Payer: Self-pay | Admitting: *Deleted

## 2014-12-30 MED ORDER — ATENOLOL 50 MG PO TABS: 50.0000 mg | ORAL_TABLET | Freq: Every day | ORAL | Status: DC

## 2015-01-27 ENCOUNTER — Ambulatory Visit (INDEPENDENT_AMBULATORY_CARE_PROVIDER_SITE_OTHER): Payer: No Typology Code available for payment source | Admitting: Internal Medicine

## 2015-01-27 ENCOUNTER — Encounter: Payer: Self-pay | Admitting: Internal Medicine

## 2015-01-27 DIAGNOSIS — E785 Hyperlipidemia, unspecified: Secondary | ICD-10-CM

## 2015-01-27 DIAGNOSIS — R7303 Prediabetes: Secondary | ICD-10-CM

## 2015-01-27 DIAGNOSIS — R7309 Other abnormal glucose: Secondary | ICD-10-CM

## 2015-01-27 DIAGNOSIS — Z0000 Encounter for general adult medical examination without abnormal findings: Secondary | ICD-10-CM

## 2015-01-27 DIAGNOSIS — I10 Essential (primary) hypertension: Secondary | ICD-10-CM

## 2015-01-27 DIAGNOSIS — I517 Cardiomegaly: Secondary | ICD-10-CM

## 2015-01-27 MED ORDER — LISINOPRIL-HYDROCHLOROTHIAZIDE 20-12.5 MG PO TABS: 1.0000 | ORAL_TABLET | Freq: Every day | ORAL | Status: DC

## 2015-01-27 MED ORDER — METFORMIN HCL 850 MG PO TABS: 850.0000 mg | ORAL_TABLET | Freq: Every day | ORAL | Status: DC

## 2015-01-27 NOTE — Patient Instructions (Signed)
Thank you for your visit today.   Please return to the internal medicine clinic in 3 month(s) or sooner if needed.  Please call Debera Lat to make an appointment at [phone].  Please continue to exercise and watching your diet.    Your current medical regimen is effective;  continue present plan and take all medications as prescribed.    I have made the following additions/changes to your medications: I have increased your blood pressure medication and sent this to the pharmacy.    I have made the following referrals for you: gynecology for pap smear and also a mammogram.   Please be sure to bring all of your medications with you to every visit; this includes herbal supplements, vitamins, eye drops, and any over-the-counter medications.   Should you have any questions regarding your medications and/or any new or worsening symptoms, please be sure to call the clinic at [phone].   If you believe that you are suffering from a life threatening condition or one that may result in the loss of limb or function, then you should call 911 or proceed to the nearest Emergency Department.     A healthy lifestyle and preventative care can promote health and wellness.   Maintain regular health, dental, and eye exams.  Eat a healthy diet. Foods like vegetables, fruits, whole grains, low-fat dairy products, and lean protein foods contain the nutrients you need without too many calories. Decrease your intake of foods high in solid fats, added sugars, and salt. Get information about a proper diet from your caregiver, if necessary.  Regular physical exercise is one of the most important things you can do for your health. Most adults should get at least 150 minutes of moderate-intensity exercise (any activity that increases your heart rate and causes you to sweat) each week. In addition, most adults need muscle-strengthening exercises on 2 or more days a week.   Maintain a healthy weight. The body  mass index (BMI) is a screening tool to identify possible weight problems. It provides an estimate of body fat based on height and weight. Your caregiver can help determine your BMI, and can help you achieve or maintain a healthy weight. For adults 20 years and older:  A BMI below 18.5 is considered underweight.  A BMI of 18.5 to 24.9 is normal.  A BMI of 25 to 29.9 is considered overweight.  A BMI of 30 and above is considered obese.  Exercise to Lose Weight Exercise and a healthy diet may help you lose weight. Your doctor may suggest specific exercises. EXERCISE IDEAS AND TIPS  Choose low-cost things you enjoy doing, such as walking, bicycling, or exercising to workout videos.  Take stairs instead of the elevator.  Walk during your lunch break.  Park your car further away from work or school.  Go to a gym or an exercise class.  Start with 5 to 10 minutes of exercise each day. Build up to 30 minutes of exercise 4 to 6 days a week.  Wear shoes with good support and comfortable clothes.  Stretch before and after working out.  Work out until you breathe harder and your heart beats faster.  Drink extra water when you exercise.  Do not do so much that you hurt yourself, feel dizzy, or get very short of breath. Exercises that burn about 150 calories:  Running 1  miles in 15 minutes.  Playing volleyball for 45 to 60 minutes.  Washing and waxing a car for 45 to  60 minutes.  Playing touch football for 45 minutes.  Walking 1  miles in 35 minutes.  Pushing a stroller 1  miles in 30 minutes.  Playing basketball for 30 minutes.  Raking leaves for 30 minutes.  Bicycling 5 miles in 30 minutes.  Walking 2 miles in 30 minutes.  Dancing for 30 minutes.  Shoveling snow for 15 minutes.  Swimming laps for 20 minutes.  Walking up stairs for 15 minutes.  Bicycling 4 miles in 15 minutes.  Gardening for 30 to 45 minutes.  Jumping rope for 15 minutes.  Washing windows  or floors for 45 to 60 minutes. Document Released: 11/27/2010 Document Revised: 01/17/2012 Document Reviewed: 11/27/2010 [facility] Patient Information 2015 [place], [place]. This information is not intended to replace advice given to you by your health care provider. Make sure you discuss any questions you have with your health care provider.
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
## 2015-01-28 ENCOUNTER — Telehealth: Payer: Self-pay | Admitting: Certified Nurse Midwife

## 2015-01-28 NOTE — Telephone Encounter (Signed)
Left patient a message to call back to schedule a new patient doctor appointment for an AEX.

## 2015-01-28 NOTE — Telephone Encounter (Signed)
Patient is returning a call to Starla. °

## 2015-01-28 NOTE — Progress Notes (Signed)
Case discussed with Dr. Gill soon after the resident saw the patient.  We reviewed the resident's history and exam and pertinent patient test results.  I agree with the assessment, diagnosis, and plan of care documented in the resident's note. 

## 2015-01-28 NOTE — Assessment & Plan Note (Signed)
-  continue metformin and meet with Debera Lat for weight loss/life coaching -continue exercising

## 2015-01-28 NOTE — Assessment & Plan Note (Addendum)
BP Readings from Last 3 Encounters:  01/27/15 158/69  07/22/14 148/69  06/10/14 152/85    Lab Results  Component Value Date   NA 141 07/12/2014   K 3.9 07/12/2014   CREATININE 0.76 07/12/2014    Assessment: Blood pressure control: mildly elevated Progress toward BP goal:  deteriorated Comments: reports compliance with meds  Plan: Medications:  will increase lisinopril to 20mg /HCTZ 12.5mg  to [facility] ; continue atenolol (mainly used for migraine prophylaxis) Other plans: advised weight loss/exercise and watch sodium content of foods

## 2015-01-28 NOTE — Assessment & Plan Note (Signed)
Pt states she still has palpitations once and a while at rest.  She has still not been to cardiology even though I referred her many months ago. -urged her to be evaluated by cardiology [facility] will make her an appointment in [facility]) -she understands to go to the ED if she experiences chest pain

## 2015-01-28 NOTE — Assessment & Plan Note (Signed)
Pt has actually gained weight even though she reports walking around [facility].  I had made a referral to [facility] previously but apparently she canceled the appt. -encouraged weight loss and exercise -gave her Butch Penny Plyler's direct line and requested that she call and make appt  -continue metformin  -consider Deatra Canter?

## 2015-01-28 NOTE — Assessment & Plan Note (Signed)
continue statin

## 2015-01-29 ENCOUNTER — Other Ambulatory Visit: Payer: Self-pay | Admitting: *Deleted

## 2015-01-29 MED ORDER — ATENOLOL 50 MG PO TABS: 50.0000 mg | ORAL_TABLET | Freq: Every day | ORAL | Status: DC

## 2015-01-29 MED ORDER — ATORVASTATIN CALCIUM 10 MG PO TABS: 10.0000 mg | ORAL_TABLET | Freq: Every day | ORAL | Status: DC

## 2015-02-24 ENCOUNTER — Ambulatory Visit (INDEPENDENT_AMBULATORY_CARE_PROVIDER_SITE_OTHER): Payer: No Typology Code available for payment source | Admitting: Dietician

## 2015-02-24 VITALS — Ht 65.0 in | Wt 349.7 lb

## 2015-02-24 DIAGNOSIS — R7309 Other abnormal glucose: Secondary | ICD-10-CM | POA: Diagnosis not present

## 2015-02-24 DIAGNOSIS — R7303 Prediabetes: Secondary | ICD-10-CM

## 2015-02-24 NOTE — Progress Notes (Signed)
  Medical Nutrition Therapy:  Appt start time: 6546 end time:  1640.  Assessment:  Primary concerns today: prediabetes/preventng daibetes.  Patient had A1C of 6 ~ 6 months ago. She has been very overweight/obese a long time, gained gradually from 317# 8 years ago to 350 for about the past 2 years.  She has a stressful and busy job, especially on Mondays and Tuesday works long hours making it difficult to take the time to eat. Prior to 3 months ago, she ate mostly 1 meal a day at night, now she eats breakfast every day, is eating balanced meals rather than snack foods and  mostly has 3 meals and 1-2 snacks a day. She is already feeling better and more energy as a result.  Her focus is not so much on weight as it is being healthy.  She sleeps well about 7 hours a night. She has supportive family willing to walknd encourage her. She does not read labels but demonstrated the ability to do so today.  Preferred Learning Style: Visual, Hands on Learning Readiness: Ready and Change in progress MEDICATIONS: metformin,   DIETARY INTAKE: Usual eating pattern includes 3 meals and 1 snacks per day. Everyday foods include oatmeal, biscuits, meat.  Avoided foods include lactose containing foods, sweets.   24-hr recall:  B (9 AM): old fashioned oatmeal, 2-3 days a week, sausage or steak  Biscuit or breakfast burrito, coffee- black  Snk ( AM): fruit L (12-1 PM): subway- cold cut trip or tuna, used to eat 12" now gets 12" and saves half for later, water/chineseveggeis & rice, egg foo yong or sesame chicken, 1/2 diet soda or diet lemonade Snk ( PM): fruit D ( PM): baked chicken and rice when at home, leftovers from her take out lunch, [employer] crispy chicken [employer] or bowl of cereal Snk ( PM): pork skins Beverages: coffee, water, lemonade, rarely diet soda, and rarely alcohol.   Usual physical activity: walks some on her job, but in past 3 months has been parking farther out, trying to take stairs and standing up  more often and moving  Weight goal set today for at least a 17# loss to prevent diabetes  Estimated energy needs for weight loss 2000 calories/day    Progress Towards Goal(s):  In progress.   Nutritional Diagnosis:  NB-1.1 Food and nutrition-related knowledge deficit As related to lack of prior exposure to meal planning for prediabetes.  As evidenced by her report and questions.    Intervention:  Nutrition education about A1C, prediabetes programs and prediabetes, and how to reverse prediabetes. Teaching Method Utilized: Visual,,Auditory,,Hands on Handouts given during visit include:  Recipes, prediabetes handout, Diabetes Prevention program packet.  Barriers to learning/adherence to lifestyle change: cost of prediabetes program and time Demonstrated degree of understanding via:  Teach Back   Monitoring/Evaluation:  Dietary intake, exercise, and body weight prn.

## 2015-03-12 ENCOUNTER — Other Ambulatory Visit: Payer: Self-pay | Admitting: Internal Medicine

## 2015-03-12 DIAGNOSIS — I517 Cardiomegaly: Secondary | ICD-10-CM

## 2015-03-12 NOTE — Progress Notes (Signed)
Cardiology referral placed per patient request.  Patient did not comply with original cardiology referral which has expired, so I placed a second referral request today.

## 2015-03-24 ENCOUNTER — Other Ambulatory Visit: Payer: Self-pay | Admitting: Internal Medicine

## 2015-03-24 ENCOUNTER — Ambulatory Visit (HOSPITAL_COMMUNITY)
Admission: RE | Admit: 2015-03-24 | Discharge: 2015-03-24 | Disposition: A | Discharge status: Home / Self Care | Payer: No Typology Code available for payment source | Source: Ambulatory Visit | Attending: Internal Medicine | Admitting: Internal Medicine

## 2015-03-24 DIAGNOSIS — Z0000 Encounter for general adult medical examination without abnormal findings: Secondary | ICD-10-CM

## 2015-03-24 DIAGNOSIS — Z1231 Encounter for screening mammogram for malignant neoplasm of breast: Secondary | ICD-10-CM

## 2015-04-14 ENCOUNTER — Telehealth: Payer: Self-pay | Admitting: Certified Nurse Midwife

## 2015-04-14 ENCOUNTER — Ambulatory Visit: Payer: Self-pay | Admitting: Certified Nurse Midwife

## 2015-04-14 NOTE — Telephone Encounter (Signed)
Pt called to cancel her 04/14/15 appointment due to death of her sister's husband. Pt will call back when she is near her calendar to reschedule appointment.

## 2015-04-14 NOTE — Telephone Encounter (Signed)
Patient cancelled [place] appointment that was scheduled for today 6/6 at 1:15pm with Regina Eck CNM. Patient would like to call back to reschedule.  Routing to Regina Eck CNM as Juluis Rainier. Will close encounter.

## 2015-04-28 ENCOUNTER — Ambulatory Visit (INDEPENDENT_AMBULATORY_CARE_PROVIDER_SITE_OTHER): Payer: No Typology Code available for payment source | Admitting: Internal Medicine

## 2015-04-28 ENCOUNTER — Encounter: Payer: Self-pay | Admitting: Internal Medicine

## 2015-04-28 VITALS — BP 140/62 | HR 59 | Temp 98.0°F | Wt 347.8 lb

## 2015-04-28 DIAGNOSIS — I10 Essential (primary) hypertension: Secondary | ICD-10-CM

## 2015-04-28 DIAGNOSIS — R002 Palpitations: Secondary | ICD-10-CM | POA: Diagnosis not present

## 2015-04-28 DIAGNOSIS — Z0000 Encounter for general adult medical examination without abnormal findings: Secondary | ICD-10-CM

## 2015-04-28 DIAGNOSIS — R7309 Other abnormal glucose: Secondary | ICD-10-CM

## 2015-04-28 DIAGNOSIS — R7303 Prediabetes: Secondary | ICD-10-CM

## 2015-04-28 NOTE — Patient Instructions (Addendum)
Thank you for your visit today.   Please return to the internal medicine clinic in 3-4 month(s) or sooner if needed.    Please keep your cardiology appointment.    Your current medical regimen is effective;  continue present plan and take all medications as prescribed.    Please reschedule your pap smear appointment.    I have made the following additions/changes to your medications: None  Please be sure to bring all of your medications with you to every visit; this includes herbal supplements, vitamins, eye drops, and any over-the-counter medications.   Should you have any questions regarding your medications and/or any new or worsening symptoms, please be sure to call the clinic at [phone].   If you believe that you are suffering from a life threatening condition or one that may result in the loss of limb or function, then you should call 911 or proceed to the nearest Emergency Department.     A healthy lifestyle and preventative care can promote health and wellness.   Maintain regular health, dental, and eye exams.  Eat a healthy diet. Foods like vegetables, fruits, whole grains, low-fat dairy products, and lean protein foods contain the nutrients you need without too many calories. Decrease your intake of foods high in solid fats, added sugars, and salt. Get information about a proper diet from your caregiver, if necessary.  Regular physical exercise is one of the most important things you can do for your health. Most adults should get at least 150 minutes of moderate-intensity exercise (any activity that increases your heart rate and causes you to sweat) each week. In addition, most adults need muscle-strengthening exercises on 2 or more days a week.   Maintain a healthy weight. The body mass index (BMI) is a screening tool to identify possible weight problems. It provides an estimate of body fat based on height and weight. Your caregiver can help determine your BMI, and can  help you achieve or maintain a healthy weight. For adults 20 years and older:  A BMI below 18.5 is considered underweight.  A BMI of 18.5 to 24.9 is normal.  A BMI of 25 to 29.9 is considered overweight.  A BMI of 30 and above is considered obese.  DASH Eating Plan DASH stands for "Dietary Approaches to Stop Hypertension." The DASH eating plan is a healthy eating plan that has been shown to reduce high blood pressure (hypertension). Additional health benefits may include reducing the risk of type 2 diabetes mellitus, heart disease, and stroke. The DASH eating plan may also help with weight loss. WHAT DO I NEED TO KNOW ABOUT THE DASH EATING PLAN? For the DASH eating plan, you will follow these general guidelines:  Choose foods with a percent daily value for sodium of less than 5% (as listed on the food label).  Use salt-free seasonings or herbs instead of table salt or sea salt.  Check with your health care provider or pharmacist before using salt substitutes.  Eat lower-sodium products, often labeled as "lower sodium" or "no salt added."  Eat fresh foods.  Eat more vegetables, fruits, and low-fat dairy products.  Choose whole grains. Look for the word "whole" as the first word in the ingredient list.  Choose fish and skinless chicken or [place] more often than red meat. Limit fish, poultry, and meat to 6 oz (170 g) each day.  Limit sweets, desserts, sugars, and sugary drinks.  Choose heart-healthy fats.  Limit cheese to 1 oz (28 g) per day.  Eat more home-cooked food and less restaurant, buffet, and fast food.  Limit fried foods.  Cook foods using methods other than frying.  Limit canned vegetables. If you do use them, rinse them well to decrease the sodium.  When eating at a restaurant, ask that your food be prepared with less salt, or no salt if possible. WHAT FOODS CAN I EAT? Seek help from a dietitian for individual calorie needs. Grains Whole grain or whole wheat  bread. Brown rice. Whole grain or whole wheat pasta. Quinoa, bulgur, and whole grain cereals. Low-sodium cereals. Corn or whole wheat flour tortillas. Whole grain cornbread. Whole grain crackers. Low-sodium crackers. Vegetables Fresh or frozen vegetables (raw, steamed, roasted, or grilled). Low-sodium or reduced-sodium tomato and vegetable juices. Low-sodium or reduced-sodium tomato sauce and paste. Low-sodium or reduced-sodium canned vegetables.  Fruits All fresh, canned (in natural juice), or frozen fruits. Meat and Other Protein Products Ground beef (85% or leaner), grass-fed beef, or beef trimmed of fat. Skinless chicken or [place]. Ground chicken or [place]. Pork trimmed of fat. All fish and seafood. Eggs. Dried beans, peas, or lentils. Unsalted nuts and seeds. Unsalted canned beans. Dairy Low-fat dairy products, such as skim or 1% milk, 2% or reduced-fat cheeses, low-fat ricotta or cottage cheese, or plain low-fat yogurt. Low-sodium or reduced-sodium cheeses. Fats and Oils Tub margarines without trans fats. Light or reduced-fat mayonnaise and salad dressings (reduced sodium). Avocado. Safflower, olive, or canola oils. Natural peanut or almond butter. Other Unsalted popcorn and pretzels. The items listed above may not be a complete list of recommended foods or beverages. Contact your dietitian for more options. WHAT FOODS ARE NOT RECOMMENDED? Grains White bread. White pasta. White rice. Refined cornbread. Bagels and croissants. Crackers that contain trans fat. Vegetables Creamed or fried vegetables. Vegetables in a cheese sauce. Regular canned vegetables. Regular canned tomato sauce and paste. Regular tomato and vegetable juices. Fruits Dried fruits. Canned fruit in light or heavy syrup. Fruit juice. Meat and Other Protein Products Fatty cuts of meat. Ribs, chicken wings, bacon, sausage, bologna, salami, chitterlings, fatback, hot dogs, bratwurst, and packaged luncheon meats. Salted nuts and  seeds. Canned beans with salt. Dairy Whole or 2% milk, cream, half-and-half, and cream cheese. Whole-fat or sweetened yogurt. Full-fat cheeses or blue cheese. Nondairy creamers and whipped toppings. Processed cheese, cheese spreads, or cheese curds. Condiments Onion and garlic salt, seasoned salt, table salt, and sea salt. Canned and packaged gravies. Worcestershire sauce. Tartar sauce. Barbecue sauce. Teriyaki sauce. Soy sauce, including reduced sodium. Steak sauce. Fish sauce. Oyster sauce. Cocktail sauce. Horseradish. Ketchup and mustard. Meat flavorings and tenderizers. Bouillon cubes. Hot sauce. Tabasco sauce. Marinades. Taco seasonings. Relishes. Fats and Oils Butter, stick margarine, lard, shortening, ghee, and bacon fat. Coconut, palm kernel, or palm oils. Regular salad dressings. Other Pickles and olives. Salted popcorn and pretzels. The items listed above may not be a complete list of foods and beverages to avoid. Contact your dietitian for more information. WHERE CAN I FIND MORE INFORMATION? National Heart, Lung, and Blood Institute: travelstabloid.com Document Released: 10/14/2011 Document Revised: 03/11/2014 Document Reviewed: 08/29/2013 [facility] Patient Information 2015 [place], [place]. This information is not intended to replace advice given to you by your health care provider. Make sure you discuss any questions you have with your health care provider.

## 2015-04-28 NOTE — Assessment & Plan Note (Addendum)
BP at goal 140/62 today, but will need to watch closely.  She has a BP meter at home but is not sure that it is working correctly.  Has seen Butch Penny Plyler in the past which has been helpful and given her some good tips for food choices.  -discussed BP goal -provided info on DASH diet and encouraged weight loss  -cont current meds -f/u in 3-4 months
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
## 2015-04-30 ENCOUNTER — Ambulatory Visit (INDEPENDENT_AMBULATORY_CARE_PROVIDER_SITE_OTHER): Payer: No Typology Code available for payment source | Admitting: Cardiovascular Disease

## 2015-04-30 ENCOUNTER — Encounter: Payer: Self-pay | Admitting: Cardiovascular Disease

## 2015-04-30 ENCOUNTER — Encounter (INDEPENDENT_AMBULATORY_CARE_PROVIDER_SITE_OTHER): Payer: No Typology Code available for payment source

## 2015-04-30 VITALS — BP 148/90 | HR 59 | Ht 66.0 in | Wt 346.0 lb

## 2015-04-30 DIAGNOSIS — R002 Palpitations: Secondary | ICD-10-CM

## 2015-04-30 DIAGNOSIS — H6192 Disorder of left external ear, unspecified: Secondary | ICD-10-CM | POA: Diagnosis not present

## 2015-04-30 DIAGNOSIS — I10 Essential (primary) hypertension: Secondary | ICD-10-CM

## 2015-04-30 DIAGNOSIS — R600 Localized edema: Secondary | ICD-10-CM
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
## 2015-04-30 NOTE — Patient Instructions (Signed)
Your physician has recommended that you wear a 30 day event monitor. Event monitors are medical devices that record the heart's electrical activity. Doctors most often [place] these monitors to diagnose arrhythmias. Arrhythmias are problems with the speed or rhythm of the heartbeat. The monitor is a small, portable device. You can wear one while you do your normal daily activities. This is usually used to diagnose what is causing palpitations/syncope (passing out).  Dr. Sallyanne Kuster recommends that you schedule a follow-up appointment in: One year.

## 2015-05-01 NOTE — Assessment & Plan Note (Signed)
Pt has actually lost several pounds since her last OV.  I congratulated her on this. She continues to park her car away from and walk to get more exercise.  We discussed that metformin may also be helping with her weight loss. -cont exercises as much as possible -info provided on DASH diet -cont metformin (also with prediabetes)

## 2015-05-01 NOTE — Progress Notes (Signed)
Internal Medicine Clinic Attending  Case discussed with Dr. Gill soon after the resident saw the patient.  We reviewed the resident's history and exam and pertinent patient test results.  I agree with the assessment, diagnosis, and plan of care documented in the resident's note.  

## 2015-05-01 NOTE — Assessment & Plan Note (Signed)
Pt missed her previous referral appt to cards but has a repeat appt this Wednesday.  Denies further CP and palpitations.  -f/u with cards on Wednesday

## 2015-05-01 NOTE — Assessment & Plan Note (Signed)
-  cont metformin and exercise

## 2015-05-01 NOTE — Assessment & Plan Note (Signed)
Pt missed her previous pap smear appt d/t the death of a friend's husband.  She has rescheduled this for August.  She also has not had a colooscopy d/t not having insurance previously so we have been doing hemoccult cards.  Last hemoccult neg 07/2014.   -will await pap smear results -will inquire about colonoscopy coverage at next OV and provide hemoccult cards

## 2015-05-19 ENCOUNTER — Other Ambulatory Visit: Payer: Self-pay | Admitting: Internal Medicine

## 2015-06-11 ENCOUNTER — Encounter: Payer: Self-pay | Admitting: Internal Medicine

## 2015-06-17 ENCOUNTER — Encounter: Payer: Self-pay | Admitting: Certified Nurse Midwife

## 2015-06-17 ENCOUNTER — Ambulatory Visit (INDEPENDENT_AMBULATORY_CARE_PROVIDER_SITE_OTHER): Payer: No Typology Code available for payment source | Admitting: Certified Nurse Midwife

## 2015-06-17 VITALS — BP 118/72 | HR 70 | Resp 20 | Ht 64.5 in | Wt 347.0 lb

## 2015-06-17 DIAGNOSIS — N950 Postmenopausal bleeding: Secondary | ICD-10-CM | POA: Diagnosis not present

## 2015-06-17 DIAGNOSIS — Z124 Encounter for screening for malignant neoplasm of cervix: Secondary | ICD-10-CM

## 2015-06-17 DIAGNOSIS — Z0000 Encounter for general adult medical examination without abnormal findings: Secondary | ICD-10-CM

## 2015-06-17 DIAGNOSIS — Z01419 Encounter for gynecological examination (general) (routine) without abnormal findings: Secondary | ICD-10-CM | POA: Diagnosis not present

## 2015-06-17 LAB — POCT URINALYSIS DIPSTICK
Bilirubin, UA: NEGATIVE
GLUCOSE UA: NEGATIVE
Ketones, UA: NEGATIVE
Leukocytes, UA: NEGATIVE
NITRITE UA: NEGATIVE
PH UA: 5
PROTEIN UA: NEGATIVE
RBC UA: NEGATIVE
UROBILINOGEN UA: NEGATIVE

## 2015-06-17 NOTE — Patient Instructions (Signed)
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
## 2015-06-18 NOTE — Progress Notes (Signed)
Reviewed personally.  M. Suzanne Shawnta Schlegel, MD.  

## 2015-06-19 LAB — IPS PAP TEST WITH HPV

## 2015-06-24 ENCOUNTER — Encounter: Payer: Self-pay | Admitting: *Deleted

## 2015-07-28 ENCOUNTER — Telehealth: Payer: Self-pay | Admitting: Certified Nurse Midwife

## 2015-07-28 ENCOUNTER — Encounter: Payer: Self-pay | Admitting: Internal Medicine

## 2015-07-28 ENCOUNTER — Encounter: Payer: No Typology Code available for payment source | Admitting: Internal Medicine

## 2015-07-28 NOTE — Telephone Encounter (Signed)
Called patient to review benefits for procedure. Left voicemail to call back and review. °

## 2015-07-29 ENCOUNTER — Other Ambulatory Visit: Payer: Self-pay | Admitting: Internal Medicine

## 2015-07-29 MED ORDER — RANITIDINE HCL 150 MG PO TABS: 150.0000 mg | ORAL_TABLET | ORAL | Status: DC | PRN

## 2015-07-29 MED ORDER — ATENOLOL 50 MG PO TABS: 50.0000 mg | ORAL_TABLET | Freq: Every day | ORAL | Status: DC

## 2015-07-29 MED ORDER — VITAMIN D 1000 UNITS PO TABS: 1000.0000 [IU] | ORAL_TABLET | Freq: Every day | ORAL | Status: DC

## 2015-07-29 MED ORDER — METFORMIN HCL 850 MG PO TABS: 850.0000 mg | ORAL_TABLET | Freq: Every day | ORAL | Status: DC

## 2015-07-29 MED ORDER — VITAMIN B-12 1000 MCG PO TABS: 1000.0000 ug | ORAL_TABLET | Freq: Every day | ORAL | Status: AC

## 2015-07-29 NOTE — Telephone Encounter (Signed)
Pt called requesting all meds to be filled.

## 2015-08-01 NOTE — Addendum Note (Signed)
Addended by: Regina Eck on: 08/01/2015 05:09 PM   Modules accepted: Orders, SmartSet

## 2015-08-01 NOTE — Telephone Encounter (Signed)
She was to have a endo biopsy for PMB at the same time.  She needs to have evaluated

## 2015-08-01 NOTE — Telephone Encounter (Signed)
Routing to [employer] CNM for review.

## 2015-08-01 NOTE — Telephone Encounter (Signed)
Spoke with patient. Reviewed benefit for PUS. Patient understands, but is not prepared to schedule at this time. She states she would like to call back and schedule when able at a later date. Please advise to keep referral open or close?

## 2015-08-04 ENCOUNTER — Encounter: Payer: No Typology Code available for payment source | Admitting: Internal Medicine

## 2015-08-05 NOTE — Telephone Encounter (Signed)
Left message to call Kaitlyn at [phone]. 

## 2015-08-13 NOTE — Telephone Encounter (Signed)
Call to patient, left message to back. Ask for Gay Filler

## 2015-08-20 NOTE — Telephone Encounter (Signed)
Routing to Dr. Sabra Heck to review and advise regarding next steps.  Patient has not returned call from nurses.  Needs post menopausal bleeding work up.     Message left to return call to [place] at [phone].

## 2015-08-25 ENCOUNTER — Other Ambulatory Visit: Payer: Self-pay | Admitting: Internal Medicine

## 2015-08-25 ENCOUNTER — Encounter: Payer: No Typology Code available for payment source | Admitting: Internal Medicine

## 2015-08-25 ENCOUNTER — Encounter: Payer: Self-pay | Admitting: Obstetrics & Gynecology

## 2015-08-25 NOTE — Telephone Encounter (Signed)
Letter written.  If you will proof it and print it, then I will sign it when back in the office and can send and close encounter.  CC: Melvia Heaps so she is aware pt did not return.

## 2015-08-25 NOTE — Telephone Encounter (Signed)
Reviewed letter. Printed and to Dr. Sabra Heck for signature.

## 2015-09-01 NOTE — Telephone Encounter (Signed)
Letter mailed via certified mail 01/56/15.  Encounter closed.

## 2015-10-20 ENCOUNTER — Encounter: Payer: Self-pay | Admitting: Internal Medicine

## 2015-10-20 ENCOUNTER — Encounter: Payer: No Typology Code available for payment source | Admitting: Internal Medicine

## 2015-12-09 ENCOUNTER — Telehealth: Payer: Self-pay | Admitting: Emergency Medicine

## 2016-02-23 NOTE — Telephone Encounter (Signed)
Encounter opened erroneously.   Closed encounter.   

## 2016-03-25 ENCOUNTER — Encounter: Payer: Self-pay | Admitting: Internal Medicine

## 2016-03-25 ENCOUNTER — Other Ambulatory Visit: Payer: Self-pay | Admitting: Internal Medicine

## 2016-03-25 NOTE — Telephone Encounter (Signed)
Attempted to contact patient this pm for an appt, but no answer.  Left msg asking to please give me a call. Going to send pt msg via my chart and letter through the mail.

## 2016-03-26 ENCOUNTER — Other Ambulatory Visit: Payer: Self-pay

## 2016-03-26 NOTE — Telephone Encounter (Signed)
Please call back Kloe from [place] regarding ranitidine med.

## 2016-03-26 NOTE — Telephone Encounter (Signed)
Clarified script.

## 2016-04-16 ENCOUNTER — Encounter: Payer: Self-pay | Admitting: *Deleted

## 2016-06-18 ENCOUNTER — Ambulatory Visit: Payer: No Typology Code available for payment source | Admitting: Certified Nurse Midwife

## 2016-06-18 ENCOUNTER — Encounter: Payer: Self-pay | Admitting: Certified Nurse Midwife
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
## 2016-12-09 ENCOUNTER — Encounter: Payer: Self-pay | Admitting: Certified Nurse Midwife

## 2017-01-18 ENCOUNTER — Other Ambulatory Visit: Payer: Self-pay

## 2017-01-18 ENCOUNTER — Encounter (HOSPITAL_COMMUNITY): Payer: Self-pay | Admitting: Emergency Medicine

## 2017-01-18 ENCOUNTER — Emergency Department (HOSPITAL_COMMUNITY): Payer: BLUE CROSS/BLUE SHIELD

## 2017-01-18 ENCOUNTER — Emergency Department (HOSPITAL_COMMUNITY)
Admission: EM | Admit: 2017-01-18 | Discharge: 2017-01-18 | Disposition: A | Discharge status: Home / Self Care | Payer: BLUE CROSS/BLUE SHIELD | Attending: Emergency Medicine | Admitting: Emergency Medicine

## 2017-01-18 DIAGNOSIS — R55 Syncope and collapse: Secondary | ICD-10-CM | POA: Diagnosis present

## 2017-01-18 DIAGNOSIS — I4891 Unspecified atrial fibrillation: Secondary | ICD-10-CM

## 2017-01-18 DIAGNOSIS — I10 Essential (primary) hypertension: Secondary | ICD-10-CM | POA: Insufficient documentation

## 2017-01-18 DIAGNOSIS — I483 Typical atrial flutter: Secondary | ICD-10-CM | POA: Diagnosis not present

## 2017-01-18 DIAGNOSIS — Z7984 Long term (current) use of oral hypoglycemic drugs: Secondary | ICD-10-CM | POA: Insufficient documentation

## 2017-01-18 DIAGNOSIS — R0689 Other abnormalities of breathing: Secondary | ICD-10-CM | POA: Diagnosis not present

## 2017-01-18 DIAGNOSIS — E119 Type 2 diabetes mellitus without complications: Secondary | ICD-10-CM | POA: Diagnosis not present

## 2017-01-18 DIAGNOSIS — Z79899 Other long term (current) drug therapy: Secondary | ICD-10-CM | POA: Insufficient documentation

## 2017-01-18 HISTORY — DX: Type 2 diabetes mellitus without complications: E11.9

## 2017-01-18 HISTORY — DX: Typical atrial flutter: I48.3

## 2017-01-18 LAB — CBC
HEMATOCRIT: 40.1 % (ref 36.0–46.0)
HEMOGLOBIN: 12.6 g/dL (ref 12.0–15.0)
MCH: 29 pg (ref 26.0–34.0)
MCHC: 31.4 g/dL (ref 30.0–36.0)
MCV: 92.2 fL (ref 78.0–100.0)
Platelets: 236 10*3/uL (ref 150–400)
RBC: 4.35 MIL/uL (ref 3.87–5.11)
RDW: 13.6 % (ref 11.5–15.5)
WBC: 7 10*3/uL (ref 4.0–10.5)

## 2017-01-18 LAB — BASIC METABOLIC PANEL
Anion gap: 10 (ref 5–15)
BUN: 7 mg/dL (ref 6–20)
CO2: 29 mmol/L (ref 22–32)
Calcium: 8.7 mg/dL — ABNORMAL LOW (ref 8.9–10.3)
Chloride: 101 mmol/L (ref 101–111)
Creatinine, Ser: 0.86 mg/dL (ref 0.44–1.00)
GFR calc Af Amer: >60 mL/min (ref >60)
GFR calc non Af Amer: >60 mL/min (ref >60)
Glucose, Bld: 147 mg/dL — ABNORMAL HIGH (ref 65–99)
Potassium: 3.9 mmol/L (ref 3.5–5.1)
Sodium: 140 mmol/L (ref 135–145)

## 2017-01-18 LAB — I-STAT TROPONIN, ED: TROPONIN I, POC: 0 ng/mL (ref 0.00–0.08)

## 2017-01-18 LAB — MAGNESIUM: MAGNESIUM: 1.9 mg/dL (ref 1.7–2.4)

## 2017-01-18 LAB — BRAIN NATRIURETIC PEPTIDE: B Natriuretic Peptide: 106.2 pg/mL — ABNORMAL HIGH (ref 0.0–100.0)

## 2017-01-18 LAB — CBG MONITORING, ED: Glucose-Capillary: 116 mg/dL — ABNORMAL HIGH (ref 65–99)

## 2017-01-18 LAB — TSH: TSH: 2.061 u[IU]/mL (ref 0.350–4.500)

## 2017-01-18 MED ORDER — METOPROLOL TARTRATE 25 MG PO TABS
25.0000 mg | ORAL_TABLET | Freq: Once | ORAL | Status: AC
  Administered 2017-01-18: 25 mg via ORAL
  Filled 2017-01-18: qty 1

## 2017-01-18 MED ORDER — PROPOFOL 10 MG/ML IV BOLUS
0.5000 mg/kg | Freq: Once | INTRAVENOUS | Status: AC
  Administered 2017-01-18: 77.8 mg via INTRAVENOUS
  Filled 2017-01-18: qty 20

## 2017-01-18 MED ORDER — APIXABAN 5 MG PO TABS: 5.0000 mg | ORAL_TABLET | Freq: Two times a day (BID) | ORAL | 0 refills | Status: DC

## 2017-01-18 MED ORDER — APIXABAN 5 MG PO TABS
5.0000 mg | ORAL_TABLET | Freq: Two times a day (BID) | ORAL | Status: DC
  Administered 2017-01-18: 5 mg via ORAL
  Filled 2017-01-18: qty 1

## 2017-01-18 NOTE — ED Notes (Signed)
cbg was 116

## 2017-01-18 NOTE — Discharge Instructions (Signed)
Take eliquis daily. Follow up in afib clinic as instructed. Return if worsening dizziness, shortness of breath, chest pain, or any new concerning symptoms.

## 2017-01-18 NOTE — ED Notes (Signed)
Pharmacy has gone over Eliquis information and provided card.
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
## 2017-01-18 NOTE — ED Triage Notes (Signed)
Pt arrives from work at [employer] via [employer] reporting witnessed syncope lasting approx 1 minute, pt denies fall, head trauma.  EMS reports pt had emesis x 3 after regaining consciousness, had urinary incontinence.  Pt reports hx of arrhythmia.Pt AOx4, actively nauseous.

## 2017-01-18 NOTE — Procedures (Signed)
Procedure: Electrical Cardioversion Indications:  Atrial Flutter  Procedure Details:  Consent: Risks of procedure as well as the alternatives and risks of each were explained to the (patient/caregiver).  Consent for procedure obtained.  Time Out: Verified patient identification, verified procedure, site/side was marked, verified correct patient position, special equipment/implants available, medications/allergies/relevent history reviewed, required imaging and test results available.  Performed  Patient placed on cardiac monitor, pulse oximetry, supplemental oxygen as necessary.  Sedation given: Propofol 80 mgIV, Dr. Maryan Rued Pacer pads placed anterior and posterior chest.  Cardioverted 1 time(s).  Cardioversion with synchronized biphasic 150J shock.  Evaluation: Findings: Post procedure EKG shows: NSR Complications: None Patient did tolerate procedure well.  Time Spent Directly with the Patient:  30 minutes   Vicki Wheeler 01/18/2017, 4:32 PM

## 2017-01-18 NOTE — ED Notes (Signed)
Cardiologist at bedside.  
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
## 2017-01-18 NOTE — ED Notes (Signed)
Admitting at bedside for assessment.

## 2017-01-18 NOTE — ED Notes (Signed)
ED Provider at bedside. 

## 2017-01-18 NOTE — ED Notes (Signed)
Pt verbalized understanding of discharge instructions and denies any further questions at this time.   
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
## 2017-01-25 ENCOUNTER — Ambulatory Visit (HOSPITAL_COMMUNITY)
Admit: 2017-01-25 | Discharge: 2017-01-25 | Disposition: A | Discharge status: Home / Self Care | Payer: BLUE CROSS/BLUE SHIELD | Attending: Nurse Practitioner | Admitting: Nurse Practitioner

## 2017-01-25 ENCOUNTER — Encounter (HOSPITAL_COMMUNITY): Payer: Self-pay | Admitting: Nurse Practitioner

## 2017-01-25 VITALS — BP 142/78 | HR 50 | Ht 65.0 in | Wt 346.0 lb

## 2017-01-25 DIAGNOSIS — Z91013 Allergy to seafood: Secondary | ICD-10-CM | POA: Diagnosis not present

## 2017-01-25 DIAGNOSIS — Z833 Family history of diabetes mellitus: Secondary | ICD-10-CM | POA: Diagnosis not present

## 2017-01-25 DIAGNOSIS — Z825 Family history of asthma and other chronic lower respiratory diseases: Secondary | ICD-10-CM | POA: Insufficient documentation

## 2017-01-25 DIAGNOSIS — Z8042 Family history of malignant neoplasm of prostate: Secondary | ICD-10-CM | POA: Insufficient documentation

## 2017-01-25 DIAGNOSIS — Z8249 Family history of ischemic heart disease and other diseases of the circulatory system: Secondary | ICD-10-CM | POA: Insufficient documentation

## 2017-01-25 DIAGNOSIS — F419 Anxiety disorder, unspecified: Secondary | ICD-10-CM | POA: Diagnosis not present

## 2017-01-25 DIAGNOSIS — I10 Essential (primary) hypertension: Secondary | ICD-10-CM | POA: Diagnosis not present

## 2017-01-25 DIAGNOSIS — Z7901 Long term (current) use of anticoagulants: Secondary | ICD-10-CM | POA: Diagnosis not present

## 2017-01-25 DIAGNOSIS — R001 Bradycardia, unspecified: Secondary | ICD-10-CM | POA: Diagnosis not present

## 2017-01-25 DIAGNOSIS — Z79899 Other long term (current) drug therapy: Secondary | ICD-10-CM | POA: Insufficient documentation

## 2017-01-25 DIAGNOSIS — E119 Type 2 diabetes mellitus without complications: Secondary | ICD-10-CM | POA: Diagnosis not present

## 2017-01-25 DIAGNOSIS — F329 Major depressive disorder, single episode, unspecified: Secondary | ICD-10-CM | POA: Insufficient documentation

## 2017-01-25 DIAGNOSIS — Z7984 Long term (current) use of oral hypoglycemic drugs: Secondary | ICD-10-CM | POA: Insufficient documentation

## 2017-01-25 DIAGNOSIS — I483 Typical atrial flutter: Secondary | ICD-10-CM | POA: Diagnosis not present

## 2017-01-25 DIAGNOSIS — Z823 Family history of stroke: Secondary | ICD-10-CM | POA: Diagnosis not present

## 2017-01-25 MED ORDER — APIXABAN 5 MG PO TABS: 5.0000 mg | ORAL_TABLET | Freq: Two times a day (BID) | ORAL | 3 refills | Status: DC

## 2017-01-25 MED ORDER — ATENOLOL 50 MG PO TABS: ORAL_TABLET | ORAL | 1 refills | Status: DC

## 2017-01-25 MED ORDER — DILTIAZEM HCL 30 MG PO TABS: ORAL_TABLET | ORAL | 2 refills | Status: DC

## 2017-01-25 NOTE — Patient Instructions (Signed)
Your physician has recommended you make the following change in your medication:  1)Increase atenolol to 1 tablet (50mg ) in the morning and 1/2 tablet (25mg )  in the evening 2)Cardizem 30mg  -- take 1 tablet every 4 hours AS NEEDED for heart rate >100 as long as blood pressure >100.    Butch Penny will be in touch with you in regards to ekg
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
## 2017-01-28 ENCOUNTER — Encounter: Payer: Self-pay | Admitting: *Deleted

## 2017-01-28 ENCOUNTER — Ambulatory Visit (INDEPENDENT_AMBULATORY_CARE_PROVIDER_SITE_OTHER): Payer: BLUE CROSS/BLUE SHIELD | Admitting: Internal Medicine

## 2017-01-28 ENCOUNTER — Encounter: Payer: Self-pay | Admitting: Internal Medicine

## 2017-01-28 VITALS — BP 130/82 | HR 64 | Ht 65.0 in | Wt 346.0 lb

## 2017-01-28 DIAGNOSIS — Z01812 Encounter for preprocedural laboratory examination: Secondary | ICD-10-CM

## 2017-01-28 DIAGNOSIS — I483 Typical atrial flutter: Secondary | ICD-10-CM

## 2017-01-28 DIAGNOSIS — I10 Essential (primary) hypertension: Secondary | ICD-10-CM | POA: Diagnosis not present

## 2017-01-28 DIAGNOSIS — R002 Palpitations: Secondary | ICD-10-CM

## 2017-01-28 LAB — CBC WITH DIFFERENTIAL/PLATELET
Basophils Absolute: 0 10*3/uL (ref 0.0–0.2)
Basos: 1 %
EOS (ABSOLUTE): 0.1 10*3/uL (ref 0.0–0.4)
Eos: 1 %
HEMOGLOBIN: 13.4 g/dL (ref 11.1–15.9)
Hematocrit: 40.9 % (ref 34.0–46.6)
IMMATURE GRANULOCYTES: 0 %
Immature Grans (Abs): 0 10*3/uL (ref 0.0–0.1)
Lymphocytes Absolute: 2.7 10*3/uL (ref 0.7–3.1)
Lymphs: 39 %
MCH: 29.4 pg (ref 26.6–33.0)
MCHC: 32.8 g/dL (ref 31.5–35.7)
MCV: 90 fL (ref 79–97)
MONOCYTES: 5 %
Monocytes Absolute: 0.4 10*3/uL (ref 0.1–0.9)
Neutrophils Absolute: 3.8 10*3/uL (ref 1.4–7.0)
Neutrophils: 54 %
Platelets: 236 10*3/uL (ref 150–379)
RBC: 4.56 x10E6/uL (ref 3.77–5.28)
RDW: 13.9 % (ref 12.3–15.4)
WBC: 7.1 10*3/uL (ref 3.4–10.8)

## 2017-01-28 LAB — BASIC METABOLIC PANEL
BUN / CREAT RATIO: 17 (ref 9–23)
BUN: 12 mg/dL (ref 6–24)
CO2: 22 mmol/L (ref 18–29)
CREATININE: 0.69 mg/dL (ref 0.57–1.00)
Calcium: 9.5 mg/dL (ref 8.7–10.2)
Chloride: 99 mmol/L (ref 96–106)
GFR calc Af Amer: 111 mL/min/{1.73_m2} (ref >59)
GFR calc non Af Amer: 96 mL/min/{1.73_m2} (ref >59)
GLUCOSE: 85 mg/dL (ref 65–99)
Potassium: 4.1 mmol/L (ref 3.5–5.2)
Sodium: 141 mmol/L (ref 134–144)
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
## 2017-01-28 NOTE — Patient Instructions (Signed)
Medication Instructions: - Your physician recommends that you continue on your current medications as directed. Please refer to the Current Medication list given to you today.  Labwork: - Your physician recommends that you have lab work today: BMP/CBC  Procedures/Testing: - Your physician has recommended that you have an Atrial Flutter ablation. Catheter ablation is a medical procedure used to treat some cardiac arrhythmias (irregular heartbeats). During catheter ablation, a long, thin, flexible tube is put into a blood vessel in your groin (upper thigh), or neck. This tube is called an ablation catheter. It is then guided to your heart through the blood vessel. Radio frequency waves destroy small areas of heart tissue where abnormal heartbeats may cause an arrhythmia to start. Please see the instruction sheet given to you today.  Follow-Up: - Your physician recommends that you schedule a follow-up appointment in: 4 weeks (from 3/29) with Dr. Rayann Heman.   Any Additional Special Instructions Will Be Listed Below (If Applicable).     If you need a refill on your cardiac medications before your next appointment, please call your pharmacy.

## 2017-02-03 ENCOUNTER — Encounter: Payer: Self-pay | Admitting: Internal Medicine

## 2017-02-03 ENCOUNTER — Ambulatory Visit (HOSPITAL_COMMUNITY): Payer: BLUE CROSS/BLUE SHIELD | Admitting: Anesthesiology

## 2017-02-03 ENCOUNTER — Encounter (HOSPITAL_COMMUNITY): Payer: Self-pay | Admitting: Anesthesiology

## 2017-02-03 ENCOUNTER — Ambulatory Visit (HOSPITAL_COMMUNITY)
Admission: RE | Admit: 2017-02-03 | Discharge: 2017-02-04 | Disposition: A | Discharge status: Home / Self Care | Payer: BLUE CROSS/BLUE SHIELD | Source: Ambulatory Visit | Attending: Internal Medicine | Admitting: Internal Medicine

## 2017-02-03 ENCOUNTER — Encounter (HOSPITAL_COMMUNITY)
Admission: RE | Disposition: A | Discharge status: Home / Self Care | Payer: Self-pay | Source: Ambulatory Visit | Attending: Internal Medicine

## 2017-02-03 DIAGNOSIS — G43909 Migraine, unspecified, not intractable, without status migrainosus: Secondary | ICD-10-CM | POA: Diagnosis not present

## 2017-02-03 DIAGNOSIS — F419 Anxiety disorder, unspecified: Secondary | ICD-10-CM | POA: Diagnosis not present

## 2017-02-03 DIAGNOSIS — Z91013 Allergy to seafood: Secondary | ICD-10-CM | POA: Diagnosis not present

## 2017-02-03 DIAGNOSIS — Z8249 Family history of ischemic heart disease and other diseases of the circulatory system: Secondary | ICD-10-CM | POA: Insufficient documentation

## 2017-02-03 DIAGNOSIS — Z833 Family history of diabetes mellitus: Secondary | ICD-10-CM | POA: Insufficient documentation

## 2017-02-03 DIAGNOSIS — Z823 Family history of stroke: Secondary | ICD-10-CM | POA: Diagnosis not present

## 2017-02-03 DIAGNOSIS — Z6841 Body Mass Index (BMI) 40.0 and over, adult: Secondary | ICD-10-CM | POA: Diagnosis not present

## 2017-02-03 DIAGNOSIS — Z7984 Long term (current) use of oral hypoglycemic drugs: Secondary | ICD-10-CM | POA: Diagnosis not present

## 2017-02-03 DIAGNOSIS — E119 Type 2 diabetes mellitus without complications: Secondary | ICD-10-CM | POA: Diagnosis not present

## 2017-02-03 DIAGNOSIS — I480 Paroxysmal atrial fibrillation: Secondary | ICD-10-CM | POA: Insufficient documentation

## 2017-02-03 DIAGNOSIS — K219 Gastro-esophageal reflux disease without esophagitis: Secondary | ICD-10-CM | POA: Diagnosis not present

## 2017-02-03 DIAGNOSIS — Z7901 Long term (current) use of anticoagulants: Secondary | ICD-10-CM | POA: Insufficient documentation

## 2017-02-03 DIAGNOSIS — I483 Typical atrial flutter: Secondary | ICD-10-CM | POA: Diagnosis not present

## 2017-02-03 DIAGNOSIS — I10 Essential (primary) hypertension: Secondary | ICD-10-CM | POA: Diagnosis not present

## 2017-02-03 DIAGNOSIS — F329 Major depressive disorder, single episode, unspecified: Secondary | ICD-10-CM | POA: Insufficient documentation

## 2017-02-03 DIAGNOSIS — E669 Obesity, unspecified: Secondary | ICD-10-CM | POA: Diagnosis present

## 2017-02-03 HISTORY — PX: A-FLUTTER ABLATION: EP1230

## 2017-02-03 HISTORY — DX: Procedure and treatment not carried out because of patient's decision for reasons of belief and group pressure: Z53.1

## 2017-02-03 LAB — GLUCOSE, CAPILLARY
GLUCOSE-CAPILLARY: 104 mg/dL — AB (ref 65–99)
Glucose-Capillary: 117 mg/dL — ABNORMAL HIGH (ref 65–99)
Glucose-Capillary: 189 mg/dL — ABNORMAL HIGH (ref 65–99)
Glucose-Capillary: 125 mg/dL — ABNORMAL HIGH (ref 65–99)

## 2017-02-03 LAB — NO BLOOD PRODUCTS

## 2017-02-03 SURGERY — A-FLUTTER ABLATION
Anesthesia: General

## 2017-02-03 MED ORDER — ONDANSETRON HCL 4 MG/2ML IJ SOLN: 4.0000 mg | Freq: Four times a day (QID) | INTRAMUSCULAR | Status: DC | PRN

## 2017-02-03 MED ORDER — METOPROLOL TARTRATE 5 MG/5ML IV SOLN
INTRAVENOUS | Status: DC | PRN
Start: 2017-02-03 — End: 2017-02-03
  Administered 2017-02-03: 5 mg via INTRAVENOUS

## 2017-02-03 MED ORDER — SODIUM CHLORIDE 0.9 % IV SOLN: INTRAVENOUS | Status: DC | PRN

## 2017-02-03 MED ORDER — MIDAZOLAM HCL 5 MG/5ML IJ SOLN
INTRAMUSCULAR | Status: DC | PRN
  Administered 2017-02-03: 2 mg via INTRAVENOUS

## 2017-02-03 MED ORDER — HYDROCODONE-ACETAMINOPHEN 5-325 MG PO TABS: 1.0000 | ORAL_TABLET | ORAL | Status: DC | PRN

## 2017-02-03 MED ORDER — ARTIFICIAL TEARS OP OINT
TOPICAL_OINTMENT | OPHTHALMIC | Status: DC | PRN
  Administered 2017-02-03: 1 via OPHTHALMIC

## 2017-02-03 MED ORDER — PROPOFOL 10 MG/ML IV BOLUS
INTRAVENOUS | Status: DC | PRN
  Administered 2017-02-03: 200 mg via INTRAVENOUS

## 2017-02-03 MED ORDER — FENTANYL CITRATE (PF) 100 MCG/2ML IJ SOLN
INTRAMUSCULAR | Status: DC | PRN
  Administered 2017-02-03 (×2): 50 ug via INTRAVENOUS

## 2017-02-03 MED ORDER — ONDANSETRON HCL 4 MG/2ML IJ SOLN
INTRAMUSCULAR | Status: DC | PRN
Start: 2017-02-03 — End: 2017-02-03
  Administered 2017-02-03 (×2): 4 mg via INTRAVENOUS

## 2017-02-03 MED ORDER — SODIUM CHLORIDE 0.9 % IV SOLN
INTRAVENOUS | Status: DC
  Administered 2017-02-03: 07:00:00 via INTRAVENOUS

## 2017-02-03 MED ORDER — LISINOPRIL 10 MG PO TABS
20.0000 mg | ORAL_TABLET | Freq: Every day | ORAL | Status: DC
  Administered 2017-02-03: 14:00:00 20 mg via ORAL
  Filled 2017-02-03: qty 2

## 2017-02-03 MED ORDER — APIXABAN 5 MG PO TABS
5.0000 mg | ORAL_TABLET | Freq: Two times a day (BID) | ORAL | Status: DC
  Administered 2017-02-03: 5 mg via ORAL
  Filled 2017-02-03: qty 1

## 2017-02-03 MED ORDER — ACETAMINOPHEN 325 MG PO TABS: 650.0000 mg | ORAL_TABLET | ORAL | Status: DC | PRN

## 2017-02-03 MED ORDER — SUCCINYLCHOLINE CHLORIDE 20 MG/ML IJ SOLN
INTRAMUSCULAR | Status: DC | PRN
  Administered 2017-02-03: 140 mg via INTRAVENOUS

## 2017-02-03 MED ORDER — DEXTROSE 5 % IV SOLN
INTRAVENOUS | Status: DC | PRN
  Administered 2017-02-03: 45 ug/min via INTRAVENOUS

## 2017-02-03 MED ORDER — FLECAINIDE ACETATE 100 MG PO TABS
300.0000 mg | ORAL_TABLET | ORAL | Status: AC
  Administered 2017-02-03: 15:00:00 300 mg via ORAL
  Filled 2017-02-03: qty 3

## 2017-02-03 MED ORDER — ROCURONIUM BROMIDE 100 MG/10ML IV SOLN
INTRAVENOUS | Status: DC | PRN
  Administered 2017-02-03: 50 mg via INTRAVENOUS

## 2017-02-03 MED ORDER — SODIUM CHLORIDE 0.9% FLUSH
3.0000 mL | Freq: Two times a day (BID) | INTRAVENOUS | Status: DC
  Administered 2017-02-03 (×3): 3 mL via INTRAVENOUS

## 2017-02-03 MED ORDER — SUGAMMADEX SODIUM 200 MG/2ML IV SOLN
INTRAVENOUS | Status: DC | PRN
  Administered 2017-02-03: 200 mg via INTRAVENOUS

## 2017-02-03 MED ORDER — SODIUM CHLORIDE 0.9% FLUSH: 3.0000 mL | INTRAVENOUS | Status: DC | PRN

## 2017-02-03 MED ORDER — HYDROCHLOROTHIAZIDE 12.5 MG PO CAPS
12.5000 mg | ORAL_CAPSULE | Freq: Every day | ORAL | Status: DC
  Administered 2017-02-03: 14:00:00 12.5 mg via ORAL
  Filled 2017-02-03: qty 1

## 2017-02-03 MED ORDER — LIDOCAINE HCL (CARDIAC) 20 MG/ML IV SOLN
INTRAVENOUS | Status: DC | PRN
  Administered 2017-02-03: 100 mg via INTRAVENOUS

## 2017-02-03 MED ORDER — HYDROCORTISONE 1 % EX CREA
TOPICAL_CREAM | Freq: Three times a day (TID) | CUTANEOUS | Status: DC
  Administered 2017-02-03 (×2): via TOPICAL
  Filled 2017-02-03: qty 28

## 2017-02-03 MED ORDER — BUPIVACAINE HCL (PF) 0.25 % IJ SOLN
INTRAMUSCULAR | Status: DC | PRN
  Administered 2017-02-03: 20 mL

## 2017-02-03 MED ORDER — OFF THE BEAT BOOK
Freq: Once | Status: AC
  Administered 2017-02-03: 20:00:00
  Filled 2017-02-03: qty 1

## 2017-02-03 MED ORDER — LISINOPRIL-HYDROCHLOROTHIAZIDE 20-12.5 MG PO TABS: 1.0000 | ORAL_TABLET | Freq: Every day | ORAL | Status: DC

## 2017-02-03 MED ORDER — METOPROLOL TARTRATE 5 MG/5ML IV SOLN
INTRAVENOUS | Status: AC
  Filled 2017-02-03: qty 5

## 2017-02-03 MED ORDER — ESMOLOL HCL 100 MG/10ML IV SOLN
INTRAVENOUS | Status: DC | PRN
  Administered 2017-02-03: 40 mg via INTRAVENOUS

## 2017-02-03 MED ORDER — SODIUM CHLORIDE 0.9 % IV SOLN
250.0000 mL | INTRAVENOUS | Status: DC | PRN
Start: 2017-02-03 — End: 2017-02-04

## 2017-02-03 MED ORDER — ATENOLOL 50 MG PO TABS
50.0000 mg | ORAL_TABLET | Freq: Every day | ORAL | Status: DC
  Administered 2017-02-03: 50 mg via ORAL
  Filled 2017-02-03: qty 1

## 2017-02-03 MED ORDER — BUPIVACAINE HCL (PF) 0.25 % IJ SOLN
INTRAMUSCULAR | Status: AC
  Filled 2017-02-03: qty 30

## 2017-02-03 MED ORDER — DEXAMETHASONE SODIUM PHOSPHATE 10 MG/ML IJ SOLN
INTRAMUSCULAR | Status: DC | PRN
  Administered 2017-02-03: 10 mg via INTRAVENOUS

## 2017-02-03 MED ORDER — PNEUMOCOCCAL VAC POLYVALENT 25 MCG/0.5ML IJ INJ
0.5000 mL | INJECTION | Freq: Once | INTRAMUSCULAR | Status: DC
  Filled 2017-02-03: qty 0.5

## 2017-02-03 MED ORDER — VECURONIUM BROMIDE 10 MG IV SOLR
INTRAVENOUS | Status: DC | PRN
Start: 2017-02-03 — End: 2017-02-03
  Administered 2017-02-03: 3 mg via INTRAVENOUS

## 2017-02-03 SURGICAL SUPPLY — 10 items
BAG SNAP BAND KOVER 36X36 (MISCELLANEOUS) ×3 IMPLANT
BLANKET WARM UNDERBOD FULL ACC (MISCELLANEOUS) ×3 IMPLANT
CATH EZ STEER NAV 8MM F-J CUR (ABLATOR) ×2 IMPLANT
CATH WEBSTER BI DIR CS D-F CRV (CATHETERS) ×2 IMPLANT
PACK EP LATEX FREE (CUSTOM PROCEDURE TRAY) ×3
PACK EP LF (CUSTOM PROCEDURE TRAY) ×1 IMPLANT
PAD DEFIB LIFELINK (PAD) ×3 IMPLANT
PATCH CARTO3 (PAD) ×2 IMPLANT
SHEATH PINNACLE 7F 10CM (SHEATH) ×2 IMPLANT
SHEATH PINNACLE 8F 10CM (SHEATH) ×2 IMPLANT

## 2017-02-03 NOTE — Anesthesia Procedure Notes (Addendum)
Procedure Name: Intubation Date/Time: 02/03/2017 9:18 AM Performed by: Jacquiline Doe A Pre-anesthesia Checklist: Patient identified, Emergency Drugs available, Suction available and Patient being monitored Patient Re-evaluated:Patient Re-evaluated prior to inductionOxygen Delivery Method: Circle System Utilized and Circle system utilized Preoxygenation: Pre-oxygenation with 100% oxygen Intubation Type: IV induction Ventilation: Two handed mask ventilation required and Oral airway inserted - appropriate to patient size Laryngoscope Size: Mac, 4 and Glidescope Grade View: Grade I Tube type: Oral Tube size: 7.5 mm Number of attempts: 2 Airway Equipment and Method: Oral airway,  Video-laryngoscopy and Rigid stylet Placement Confirmation: ETT inserted through vocal cords under direct vision,  positive ETCO2 and breath sounds checked- equal and bilateral Secured at: 22 cm Tube secured with: Tape Dental Injury: Teeth and Oropharynx as per pre-operative assessment  Difficulty Due To: Difficulty was anticipated, Difficult Airway- due to large tongue, Difficult Airway- due to anterior larynx and Difficult Airway- due to limited oral opening

## 2017-02-03 NOTE — Anesthesia Preprocedure Evaluation (Signed)
Anesthesia Evaluation  Patient identified by MRN, date of birth, ID band Patient awake    Reviewed: Allergy & Precautions, NPO status , Patient's Chart, lab work & pertinent test results  History of Anesthesia Complications Negative for: history of anesthetic complications  Airway Mallampati: IV  TM Distance: >3 FB Neck ROM: Full    Dental  (+) Teeth Intact   Pulmonary neg pulmonary ROS,    breath sounds clear to auscultation       Cardiovascular hypertension, Pt. on medications and Pt. on home beta blockers + dysrhythmias Atrial Fibrillation  Rhythm:Irregular     Neuro/Psych  Headaches, PSYCHIATRIC DISORDERS Anxiety Depression    GI/Hepatic Neg liver ROS, GERD  Controlled,  Endo/Other  diabetes, Type 2, Oral Hypoglycemic AgentsMorbid obesity  Renal/GU negative Renal ROS     Musculoskeletal negative musculoskeletal ROS (+)   Abdominal   Peds  Hematology   Anesthesia Other Findings   Reproductive/Obstetrics                             Anesthesia Physical Anesthesia Plan  ASA: III  Anesthesia Plan: General   Post-op Pain Management:    Induction: Intravenous  Airway Management Planned: Oral ETT  Additional Equipment: None  Intra-op Plan:   Post-operative Plan: Extubation in OR  Informed Consent: I have reviewed the patients History and Physical, chart, labs and discussed the procedure including the risks, benefits and alternatives for the proposed anesthesia with the patient or authorized representative who has indicated his/her understanding and acceptance.   Dental advisory given  Plan Discussed with: CRNA and Surgeon  Anesthesia Plan Comments:         Anesthesia Quick Evaluation

## 2017-02-03 NOTE — Progress Notes (Signed)
Pt is a Jehovah Witness. Pt is okay with albumin. Blood refusal form signed and faxed to blood bank.

## 2017-02-03 NOTE — Interval H&P Note (Signed)
History and Physical Interval Note:  02/03/2017 5:53 AM  On exam this am, sinus rhythm.  She reports compliance with eliquis without interruption  Vicki Wheeler  has presented today for surgery, with the diagnosis of flutter  The various methods of treatment have been discussed with the patient and family. After consideration of risks, benefits and other options for treatment, the patient has consented to  Procedure(s): A-Flutter Ablation (N/A) as a surgical intervention .  The patient's history has been reviewed, patient examined, no change in status, stable for surgery.  I have reviewed the patient's chart and labs.  Questions were answered to the patient's satisfaction.     Thompson Grayer

## 2017-02-03 NOTE — Progress Notes (Addendum)
Site area: RFV x 2 Site Prior to Removal:  Level 0 Pressure Applied For:25 min Manual:   yes Patient Status During Pull:  stable Post Pull Site:  Level 0 Post Pull Instructions Given:  yes Post Pull Pulses Present: -palpable Dressing Applied:  tegaderm Bedrest begins @ 1200 till 1800 Comments:
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
## 2017-02-03 NOTE — Discharge Summary (Signed)
ELECTROPHYSIOLOGY PROCEDURE DISCHARGE SUMMARY    [name],  MRN: [id number], DOB/AGE: 07/16/1956 61 y.o.  Admit date: 02/03/2017 Discharge date: 02/04/2017  Primary Care Physician: Asencion Partridge, MD Primary Cardiologist: [place] Electrophysiologist: Atalaya Zappia  Primary Discharge Diagnosis:  Atrial flutter status post ablation this admission  Secondary Discharge Diagnosis:  1.  Paroxysmal atrial fibrillation 2.  Morbid obesity 3.  Diabetes 4.  HTN  Allergies  Allergen Reactions  . Shellfish Allergy     blisters     Procedures This Admission: 1.  Electrophysiology study and radiofrequency catheter ablation on 02/03/17 by Dr Rayann Heman.  This study demonstrated typical atrial flutter with successful CTI ablation.  She had easily inducible atrial fibrillation post ablation and failed DCCV. There were no inducible arrhythmias following ablation and no early apparent complications.   Brief HPI: Vicki Wheeler is a 61 y.o. female with a past medical history as outlined above.  She has documented atrial flutter. She has been appropriately anticoagulated for >4 weeks.  Risks, benefits, and alternatives to ablation were reviewed with the patient who wished to proceed.   Hospital Course:  The patient was admitted and underwent EPS/RFCA with details as outlined above. She had atrial fibrillation post procedure and was given Flecainide 300mg  x1 which converted her to SR. She was monitored on telemetry overnight which demonstrated sinus rhythm.  Groin incision was without complication.  She had minor skin irritation from intraoperative cardioversion.  She was examined by Dr Rayann Heman who considered them stable for discharge to home.  Follow up will be arranged with the AF clinic next week.  Wound care and restrictions were reviewed with the patient prior to discharge.   Physical Exam: Vitals:   02/03/17 1600 02/03/17 1907 02/03/17 2000 02/04/17 0201  BP: (!) 165/116 (!)  119/59  132/62  Pulse: 67 71  (!) 59  Resp: 19 14 19 13   Temp: 97.9 F (36.6 C) 99.4 F (37.4 C)  97.6 F (36.4 C)  TempSrc: Oral Oral  Axillary  SpO2: 100% 95%  93%  Weight:    (!) 318 lb 2 oz (144.3 kg)  Height:        GEN- The patient is morbidly obese appearing, alert and oriented x 3 today.   HEENT: normocephalic, atraumatic; sclera clear, conjunctiva pink; hearing intact; oropharynx clear; neck supple  Lungs- Clear to ausculation bilaterally, normal work of breathing.  No wheezes, rales, rhonchi Heart- Regular rate and rhythm  GI- soft, non-tender, non-distended, bowel sounds present  Extremities- no clubbing, cyanosis, or edema; DP/PT/radial pulses 2+ bilaterally, groin without hematoma/bruit MS- no significant deformity or atrophy Skin- very minor skin irritation from cardioversion Psych- euthymic mood, full affect Neuro- strength and sensation are intact   Labs:   Lab Results  Component Value Date   WBC 7.1 01/28/2017   HGB 12.6 01/18/2017   HCT 40.9 01/28/2017   MCV 90 01/28/2017   PLT 236 01/28/2017     Recent Labs Lab 01/28/17 1003  NA 141  K 4.1  CL 99  CO2 22  BUN 12  CREATININE 0.69  CALCIUM 9.5  GLUCOSE 85    Discharge Medications:  Allergies as of 02/04/2017      Reactions   Shellfish Allergy    blisters      Medication List    TAKE these medications   apixaban 5 MG Tabs tablet Commonly known as:  ELIQUIS Take 1 tablet (5 mg total) by mouth 2 (two) times daily.  atenolol 50 MG tablet Commonly known as:  TENORMIN Take 1 tablet (50mg ) by mouth in the morning and 1/2 tablet (25mg ) in the evening   atorvastatin 10 MG tablet Commonly known as:  LIPITOR TAKE 1 TABLET BY MOUTH ONCE A DAY   cholecalciferol 1000 units tablet Commonly known as:  VITAMIN D Take 1 tablet (1,000 Units total) by mouth daily.   diltiazem 30 MG tablet Commonly known as:  CARDIZEM Take 1 tablet every 4 hours AS NEEDED for AFIB heart rate over 100     lisinopril-hydrochlorothiazide 20-12.5 MG tablet Commonly known as:  PRINZIDE Take 1 tablet by mouth daily.   metFORMIN 850 MG tablet Commonly known as:  GLUCOPHAGE Take 1 tablet (850 mg total) by mouth daily with breakfast.   ranitidine 150 MG tablet Commonly known as:  ZANTAC TAKE 1 TABLET (150 MG TOTAL) BY MOUTH AS NEEDED.   vitamin B-12 1000 MCG tablet Commonly known as:  CYANOCOBALAMIN Take 1 tablet (1,000 mcg total) by mouth daily.   vitamin C with rose hips 1000 MG tablet Take 1,000 mg by mouth daily.       Disposition:   Follow-up Information    [facility] Follow up on 02/10/2017.   Specialty:  Cardiology Why:  at [facility] information: [address] [id number] [place] [phone]          Duration of Discharge Encounter: Greater than 30 minutes including physician time.  Army Fossa MD 02/04/2017 6:38 AM

## 2017-02-03 NOTE — Care Management Note (Signed)
Case Management Note  Patient Details  Name: Vicki Wheeler MRN: [id number] Date of Birth: 1956/10/26  Subjective/Objective:   From home alone,uses a cane at times, s/p aflutter failed ablation, patient is already on eliquis, she gets it for $10 co pay.  NCM will cont to follow for dc needs.                  Action/Plan:   Expected Discharge Date:                  Expected Discharge Plan:  Home/Self Care  In-House Referral:     Discharge planning Services  CM Consult  Post Acute Care Choice:    Choice offered to:     DME Arranged:    DME Agency:     HH Arranged:    HH Agency:     Status of Service:  Completed, signed off  If discussed at [employer] of Stay Meetings, dates discussed:    Additional Comments:  Zenon Mayo, RN 02/03/2017, 3:05 PM

## 2017-02-03 NOTE — Transfer of Care (Signed)
Immediate Anesthesia Transfer of Care Note  Patient: Vicki Wheeler  Procedure(s) Performed: Procedure(s): A-Flutter Ablation (N/A)  Patient Location: Cath Lab  Anesthesia Type:General  Level of Consciousness: awake, oriented, sedated, patient cooperative and responds to stimulation  Airway & Oxygen Therapy: Patient Spontanous Breathing and Patient connected to nasal cannula oxygen  Post-op Assessment: Report given to RN, Post -op Vital signs reviewed and stable, Patient moving all extremities and Patient moving all extremities X 4  Post vital signs: Reviewed and stable  Last Vitals:  Vitals:   02/03/17 0536 02/03/17 1050  BP: (!) 153/73 (!) 157/90  Pulse: 65 (!) 101  Resp: 18 16  Temp: 36.6 C 36.2 C    Last Pain:  Vitals:   02/03/17 1050  TempSrc: Temporal  PainSc:          Complications: No apparent anesthesia complications

## 2017-02-04 DIAGNOSIS — I10 Essential (primary) hypertension: Secondary | ICD-10-CM | POA: Diagnosis not present

## 2017-02-04 DIAGNOSIS — E119 Type 2 diabetes mellitus without complications: Secondary | ICD-10-CM | POA: Diagnosis not present

## 2017-02-04 DIAGNOSIS — I480 Paroxysmal atrial fibrillation: Secondary | ICD-10-CM | POA: Diagnosis not present

## 2017-02-04 DIAGNOSIS — I483 Typical atrial flutter: Secondary | ICD-10-CM

## 2017-02-04 LAB — GLUCOSE, CAPILLARY: GLUCOSE-CAPILLARY: 112 mg/dL — AB (ref 65–99)

## 2017-02-04 NOTE — Anesthesia Postprocedure Evaluation (Addendum)
Anesthesia Post Note  Patient: Malajah Nied  Procedure(s) Performed: Procedure(s) (LRB): A-Flutter Ablation (N/A)  Patient location during evaluation: PACU Anesthesia Type: General Level of consciousness: awake and alert Pain management: pain level controlled Vital Signs Assessment: post-procedure vital signs reviewed and stable Respiratory status: spontaneous breathing, nonlabored ventilation, respiratory function stable and patient connected to nasal cannula oxygen Cardiovascular status: blood pressure returned to baseline and stable Postop Assessment: no signs of nausea or vomiting Anesthetic complications: no       Last Vitals:  Vitals:   02/04/17 0201 02/04/17 0744  BP: 132/62 (!) 133/57  Pulse: (!) 59 (!) 59  Resp: 13 15  Temp: 36.4 C 36.7 C    Last Pain:  Vitals:   02/04/17 0744  TempSrc: Oral  PainSc:                  Breeonna Mone

## 2017-02-04 NOTE — Discharge Instructions (Signed)

## 2017-02-04 NOTE — Progress Notes (Signed)
Tech gave Pt all bath supplies

## 2017-02-10 ENCOUNTER — Ambulatory Visit (HOSPITAL_COMMUNITY)
Admit: 2017-02-10 | Discharge: 2017-02-10 | Disposition: A | Discharge status: Home / Self Care | Payer: BLUE CROSS/BLUE SHIELD | Attending: Nurse Practitioner | Admitting: Nurse Practitioner

## 2017-02-10 ENCOUNTER — Encounter (HOSPITAL_COMMUNITY): Payer: Self-pay | Admitting: Nurse Practitioner

## 2017-02-10 VITALS — BP 124/74 | HR 57 | Ht 65.0 in | Wt 342.0 lb

## 2017-02-10 DIAGNOSIS — Z833 Family history of diabetes mellitus: Secondary | ICD-10-CM | POA: Insufficient documentation

## 2017-02-10 DIAGNOSIS — Z8042 Family history of malignant neoplasm of prostate: Secondary | ICD-10-CM | POA: Diagnosis not present

## 2017-02-10 DIAGNOSIS — Z91013 Allergy to seafood: Secondary | ICD-10-CM | POA: Insufficient documentation

## 2017-02-10 DIAGNOSIS — E119 Type 2 diabetes mellitus without complications: Secondary | ICD-10-CM | POA: Insufficient documentation

## 2017-02-10 DIAGNOSIS — Z8249 Family history of ischemic heart disease and other diseases of the circulatory system: Secondary | ICD-10-CM | POA: Diagnosis not present

## 2017-02-10 DIAGNOSIS — Z9889 Other specified postprocedural states: Secondary | ICD-10-CM | POA: Insufficient documentation

## 2017-02-10 DIAGNOSIS — F419 Anxiety disorder, unspecified: Secondary | ICD-10-CM | POA: Insufficient documentation

## 2017-02-10 DIAGNOSIS — Z7984 Long term (current) use of oral hypoglycemic drugs: Secondary | ICD-10-CM | POA: Diagnosis not present

## 2017-02-10 DIAGNOSIS — I10 Essential (primary) hypertension: Secondary | ICD-10-CM | POA: Insufficient documentation

## 2017-02-10 DIAGNOSIS — Z79899 Other long term (current) drug therapy: Secondary | ICD-10-CM | POA: Insufficient documentation

## 2017-02-10 DIAGNOSIS — Z823 Family history of stroke: Secondary | ICD-10-CM | POA: Diagnosis not present

## 2017-02-10 DIAGNOSIS — I483 Typical atrial flutter: Secondary | ICD-10-CM | POA: Diagnosis present

## 2017-02-10 DIAGNOSIS — F329 Major depressive disorder, single episode, unspecified: Secondary | ICD-10-CM | POA: Diagnosis not present

## 2017-02-10 DIAGNOSIS — Z7901 Long term (current) use of anticoagulants: Secondary | ICD-10-CM | POA: Insufficient documentation
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
## 2017-02-10 NOTE — Addendum Note (Signed)
Encounter addended by: Sherran Needs, NP on: 02/10/2017 12:46 PM<BR>    Actions taken: Sign clinical note

## 2017-02-28 ENCOUNTER — Ambulatory Visit (INDEPENDENT_AMBULATORY_CARE_PROVIDER_SITE_OTHER): Payer: BLUE CROSS/BLUE SHIELD | Admitting: Internal Medicine

## 2017-02-28 ENCOUNTER — Encounter: Payer: Self-pay | Admitting: Internal Medicine

## 2017-02-28 VITALS — BP 124/78 | HR 67 | Ht 65.0 in | Wt 345.0 lb

## 2017-02-28 DIAGNOSIS — I483 Typical atrial flutter: Secondary | ICD-10-CM

## 2017-02-28 DIAGNOSIS — I10 Essential (primary) hypertension: Secondary | ICD-10-CM

## 2017-02-28 MED ORDER — ATENOLOL 50 MG PO TABS: 50.0000 mg | ORAL_TABLET | Freq: Every day | ORAL | 3 refills | Status: DC

## 2017-02-28 NOTE — Patient Instructions (Signed)
Medication Instructions:  Your physician has recommended you make the following change in your medication:  1) Decrease Atenolol to 50 mg daily   Labwork: None ordered   Testing/Procedures: None ordered   Follow-Up: Your physician recommends that you schedule a follow-up appointment as needed with Dr Rayann Heman   Any Other Special Instructions Will Be Listed Below (If Applicable).     If you need a refill on your cardiac medications before your next appointment, please call your pharmacy.

## 2017-02-28 NOTE — Progress Notes (Signed)
Thanks, [name]! Munachimso Palin
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
## 2017-03-11 ENCOUNTER — Ambulatory Visit: Payer: BLUE CROSS/BLUE SHIELD | Admitting: Internal Medicine

## 2017-03-28 ENCOUNTER — Ambulatory Visit (INDEPENDENT_AMBULATORY_CARE_PROVIDER_SITE_OTHER): Payer: BLUE CROSS/BLUE SHIELD | Admitting: Cardiovascular Disease

## 2017-03-28 ENCOUNTER — Encounter: Payer: Self-pay | Admitting: Cardiovascular Disease

## 2017-03-28 VITALS — BP 140/84 | HR 60 | Ht 65.0 in | Wt 342.8 lb

## 2017-03-28 DIAGNOSIS — I480 Paroxysmal atrial fibrillation: Secondary | ICD-10-CM | POA: Diagnosis not present

## 2017-03-28 DIAGNOSIS — I10 Essential (primary) hypertension: Secondary | ICD-10-CM

## 2017-03-28 DIAGNOSIS — R7303 Prediabetes: Secondary | ICD-10-CM

## 2017-03-28 DIAGNOSIS — I483 Typical atrial flutter: Secondary | ICD-10-CM | POA: Diagnosis not present
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
## 2017-03-28 NOTE — Patient Instructions (Signed)
Dr Sallyanne Kuster recommends that you have a loop recorder implanted. This will be performed at [facility] on Wednesday, 03/30/17 at 3:30p. You will need to arrive to the hospital 1 hour early to get prepared for the procedure. Please follow all instructions provided

## 2017-03-30 ENCOUNTER — Encounter (HOSPITAL_COMMUNITY)
Admission: RE | Disposition: A | Discharge status: Home / Self Care | Payer: Self-pay | Source: Ambulatory Visit | Attending: Cardiovascular Disease

## 2017-03-30 ENCOUNTER — Ambulatory Visit (HOSPITAL_COMMUNITY)
Admission: RE | Admit: 2017-03-30 | Discharge: 2017-03-30 | Disposition: A | Discharge status: Home / Self Care | Payer: BLUE CROSS/BLUE SHIELD | Source: Ambulatory Visit | Attending: Cardiovascular Disease | Admitting: Cardiovascular Disease

## 2017-03-30 DIAGNOSIS — F419 Anxiety disorder, unspecified: Secondary | ICD-10-CM | POA: Diagnosis not present

## 2017-03-30 DIAGNOSIS — I484 Atypical atrial flutter: Secondary | ICD-10-CM | POA: Diagnosis not present

## 2017-03-30 DIAGNOSIS — F329 Major depressive disorder, single episode, unspecified: Secondary | ICD-10-CM | POA: Diagnosis not present

## 2017-03-30 DIAGNOSIS — Z6841 Body Mass Index (BMI) 40.0 and over, adult: Secondary | ICD-10-CM | POA: Insufficient documentation

## 2017-03-30 DIAGNOSIS — Z7984 Long term (current) use of oral hypoglycemic drugs: Secondary | ICD-10-CM | POA: Diagnosis not present

## 2017-03-30 DIAGNOSIS — I10 Essential (primary) hypertension: Secondary | ICD-10-CM | POA: Diagnosis not present

## 2017-03-30 DIAGNOSIS — I480 Paroxysmal atrial fibrillation: Secondary | ICD-10-CM | POA: Diagnosis not present

## 2017-03-30 DIAGNOSIS — Z91013 Allergy to seafood: Secondary | ICD-10-CM | POA: Diagnosis not present

## 2017-03-30 DIAGNOSIS — G43909 Migraine, unspecified, not intractable, without status migrainosus: Secondary | ICD-10-CM | POA: Diagnosis not present

## 2017-03-30 DIAGNOSIS — R7303 Prediabetes: Secondary | ICD-10-CM | POA: Diagnosis not present

## 2017-03-30 DIAGNOSIS — Z7901 Long term (current) use of anticoagulants: Secondary | ICD-10-CM | POA: Diagnosis not present

## 2017-03-30 HISTORY — PX: LOOP RECORDER INSERTION: EP1214

## 2017-03-30 SURGERY — LOOP RECORDER INSERTION

## 2017-03-30 MED ORDER — LIDOCAINE HCL (PF) 1 % IJ SOLN
INTRAMUSCULAR | Status: AC
  Filled 2017-03-30: qty 30

## 2017-03-30 MED ORDER — LIDOCAINE HCL (PF) 1 % IJ SOLN
INTRAMUSCULAR | Status: DC | PRN
  Administered 2017-03-30: 15 mL

## 2017-03-30 SURGICAL SUPPLY — 2 items
LOOP REVEAL LINQSYS (Prosthesis & Implant Heart) ×2 IMPLANT
PACK LOOP INSERTION (CUSTOM PROCEDURE TRAY) ×3 IMPLANT

## 2017-03-30 NOTE — Interval H&P Note (Signed)
History and Physical Interval Note:  03/30/2017 3:40 PM  Vicki Wheeler  has presented today for surgery, with the diagnosis of afib  The various methods of treatment have been discussed with the patient and family. After consideration of risks, benefits and other options for treatment, the patient has consented to  Procedure(s): Loop Recorder Insertion (N/A) as a surgical intervention .  The patient's history has been reviewed, patient examined, no change in status, stable for surgery.  I have reviewed the patient's chart and labs.  Questions were answered to the patient's satisfaction.     Vicki Wheeler
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
## 2017-03-30 NOTE — Op Note (Signed)
LOOP RECORDER IMPLANT   Procedure report  Procedure performed:  Loop recorder implantation   Reason for procedure:  Paroxysmal atrial fibrillation and atypical atrial flutter Procedure performed by:  Sanda Klein, MD  Complications:  None  Estimated blood loss:  <5 mL  Medications administered during procedure:  Lidocaine 1% with 1/10,000 epinephrine 10 mL locally Device details:  Medtronic Reveal Linq model number [id number], serial number [id number] Procedure details:  After the risks and benefits of the procedure were discussed the patient provided informed consent. The patient was prepped and draped in usual sterile fashion. Local anesthesia was administered to an area 2 cm to the left of the sternum in the 4th intercostal space. A cutaneous incision was made using the incision tool. The introducer was then used to create a subcutaneous tunnel and carefully deploy the device. Local pressure was held to ensure hemostasis.  The incision was closed with SteriStrips and a sterile dressing was applied.  R waves 0.33 mV  Sanda Klein, MD, [facility] [phone] office [phone] pager 03/30/2017 4:37 PM

## 2017-03-31 ENCOUNTER — Encounter (HOSPITAL_COMMUNITY): Payer: Self-pay | Admitting: Cardiovascular Disease

## 2017-04-11 NOTE — Addendum Note (Signed)
Addendum  created 04/11/17 1258 by Breylen Agyeman, MD   Sign clinical note    

## 2017-04-12 ENCOUNTER — Ambulatory Visit (INDEPENDENT_AMBULATORY_CARE_PROVIDER_SITE_OTHER): Payer: Self-pay | Admitting: *Deleted

## 2017-04-12 DIAGNOSIS — I484 Atypical atrial flutter: Secondary | ICD-10-CM

## 2017-04-12 LAB — CUP PACEART INCLINIC DEVICE CHECK
Date Time Interrogation Session: 20180605135054
MDC IDC PG IMPLANT DT: 20180523

## 2017-04-12 NOTE — Progress Notes (Signed)
Wound check appointment. Steri-strips removed. Wound without redness or edema. Incision edges approximated, wound well healed. Loop check in clinic. Battery status: good. R-waves 0.34mV. 0 symptom episodes, 0 tachy episodes, 0 pause episodes, 0 brady episodes. 0 AF episodes. Monthly summary reports and ROV with [facility] PRN

## 2017-05-03 ENCOUNTER — Ambulatory Visit (INDEPENDENT_AMBULATORY_CARE_PROVIDER_SITE_OTHER): Payer: BC Managed Care – PPO | Admitting: *Deleted

## 2017-05-03 DIAGNOSIS — I480 Paroxysmal atrial fibrillation: Secondary | ICD-10-CM | POA: Diagnosis not present

## 2017-05-04 NOTE — Progress Notes (Signed)
Carelink Summary Report / Loop Recorder 

## 2017-05-13 LAB — CUP PACEART REMOTE DEVICE CHECK
Implantable Pulse Generator Implant Date: 20180523
MDC IDC SESS DTM: 20180626141253

## 2017-05-13 NOTE — Progress Notes (Signed)
Carelink summary report received. Battery status OK. Normal device function. No new symptom episodes, tachy episodes, brady, or pause episodes. No new AF episodes. Monthly summary reports and ROV/PRN 

## 2017-06-02 ENCOUNTER — Encounter: Payer: BC Managed Care – PPO | Admitting: *Deleted

## 2017-06-08 ENCOUNTER — Telehealth: Payer: Self-pay | Admitting: *Deleted

## 2017-06-08 NOTE — Telephone Encounter (Signed)
LMOVM (DPR) requesting manual Carelink transmission for review.  Gave direct Device Clinic phone number for questions/concerns.  5 "AF" episodes noted on LINQ, but unable to see all ECGs.  Will review with Dr. Sallyanne Kuster when manual transmission is received.

## 2017-06-10 NOTE — Telephone Encounter (Signed)
Unfortunately, the loop recorder has confirmed that she has intermittent episodes of paroxysmal atrial fibrillation. This means that despite her successful atrial flutter ablation, she should take anticoagulation lifelong, barring any bleeding problems. On the other hand, the episodes of arrhythmia are neither frequent, nor particularly fast. Antiarrhythmics did not appear to be justified. Continue same dose of atenolol at least for the time being. MCr

## 2017-06-10 NOTE — Telephone Encounter (Signed)
LMOVM advising that manual Carelink transmission was received.  "AF" episodes appear appropriate, total burden 0.3% since implant, longest episode 2hr 3min.  No f/u currently scheduled with cardiology, will route to Dr. Sallyanne Kuster for f/u recommendations.

## 2017-06-13 NOTE — Telephone Encounter (Signed)
Thanks MCr 

## 2017-06-13 NOTE — Telephone Encounter (Signed)
Advised patient of Dr. Victorino December recommendations.  She states that she stopped Eliquis about 10 days ago and "jumped the gun" with that decision.  She has been completely asymptomatic with AF episodes.  Patient agrees to restart Eliquis 5mg  BID and to continue atenolol 50mg  daily.  She is aware that the pharmacy should still have refills on file, but will plan to call our office if she has any issues getting it filled.  Patient is appreciative of call and denies additional questions or concerns at this time.

## 2017-06-24 ENCOUNTER — Other Ambulatory Visit: Payer: Self-pay

## 2017-07-04 ENCOUNTER — Ambulatory Visit (INDEPENDENT_AMBULATORY_CARE_PROVIDER_SITE_OTHER): Payer: BC Managed Care – PPO | Admitting: *Deleted

## 2017-07-04 DIAGNOSIS — I480 Paroxysmal atrial fibrillation: Secondary | ICD-10-CM

## 2017-07-04 NOTE — Progress Notes (Signed)
Carelink Summary Report / Loop Recorder 

## 2017-07-08 LAB — CUP PACEART REMOTE DEVICE CHECK
Date Time Interrogation Session: 20180825162617
MDC IDC PG IMPLANT DT: 20180523

## 2017-07-27 ENCOUNTER — Telehealth: Payer: Self-pay | Admitting: Cardiology

## 2017-07-27 NOTE — Telephone Encounter (Signed)
LMOVM requesting that pt send manual transmission b/c home monitor has not updated in at least 14 days.    

## 2017-08-01 ENCOUNTER — Ambulatory Visit (INDEPENDENT_AMBULATORY_CARE_PROVIDER_SITE_OTHER): Payer: BC Managed Care – PPO | Admitting: *Deleted

## 2017-08-01 ENCOUNTER — Telehealth: Payer: Self-pay | Admitting: *Deleted

## 2017-08-01 DIAGNOSIS — I480 Paroxysmal atrial fibrillation: Secondary | ICD-10-CM | POA: Diagnosis not present

## 2017-08-01 NOTE — Telephone Encounter (Signed)
Temporary interruption of the anticoagulant for 48-72 hours for dental procedures would be appropriate, low risk of complications with brief interruption.  MCr

## 2017-08-01 NOTE — Progress Notes (Signed)
Carelink Summary Report / Loop Recorder 

## 2017-08-01 NOTE — Telephone Encounter (Signed)
Spoke with patient regarding tachy episodes on LINQ from 9/17 and 9/22.  Patient does not recall any symptoms during 9/17 episode, but did feel fast HR during 9/22 episode.  She was "rushing" at the time and feels this triggered the episode.  Patient took her PRN diltiazem and symptoms resolved.  She is still taking Eliquis and atenolol as prescribed.  Patient has stopped drinking caffeine and has not had any alcohol in recent weeks.  She is starting a water aerobics class next week.  She will have some upcoming dental procedures and wants to know what to do with her Eliquis.  Advised patient that once the dates and the actual procedures are planned, she (or the dentist office) can contact for specific recommendations.  Patient is agreeable.  Advised patient that I will forward this information to Dr. Sallyanne Kuster for any additional recommendations.  Patient is appreciative and denies additional questions or concerns at this time.

## 2017-08-02 LAB — CUP PACEART REMOTE DEVICE CHECK
Date Time Interrogation Session: 20180924161254
MDC IDC PG IMPLANT DT: 20180523

## 2017-08-03 ENCOUNTER — Telehealth: Payer: Self-pay | Admitting: *Deleted

## 2017-08-03 DIAGNOSIS — I480 Paroxysmal atrial fibrillation: Secondary | ICD-10-CM

## 2017-08-03 MED ORDER — ATENOLOL 50 MG PO TABS: 75.0000 mg | ORAL_TABLET | Freq: Every day | ORAL | 3 refills | Status: DC

## 2017-08-03 NOTE — Telephone Encounter (Signed)
Patient returned call.  Advised her of Dr. Victorino December recommendations.  Patient is taking medications as prescribed and agrees to increase atenolol to 75mg  daily, order updated.  Patient agreeable to having labs drawn tomorrow at the [facility] office, appointment scheduled for 4:15pm.  Advised I will request that scheduler contact her to set up an appointment with Dr. Sallyanne Kuster prior to the end of the year and patient is agreeable.  She is appreciative and denies additional questions or concerns at this time.  Routed to scheduler for assistance.

## 2017-08-03 NOTE — Telephone Encounter (Signed)
-----   Message from Sanda Klein, MD sent at 08/02/2017  4:51 PM EDT ----- Normal implantable loop recorder download. Markedly increased frequency of atrial fibrillation and episodes of atrial flutter with variable AV block over the last 30 days.  Atypical atrial flutter was documented at the time of her cavotricuspid isthmus ablation. She is receiving anticoagulation. Normal battery status.  Please check basic metabolic panel and magnesium level. Please confirm that she is taking her medications as prescribed, if so, please ask her to increase atenolol to 75 mg daily. Needs a follow-up appointment before the end of the year, please

## 2017-08-03 NOTE — Telephone Encounter (Signed)
LMOVM requesting call back to [facility].  Gave direct phone number. 

## 2017-08-04 ENCOUNTER — Other Ambulatory Visit: Payer: BC Managed Care – PPO

## 2017-08-04 DIAGNOSIS — I480 Paroxysmal atrial fibrillation: Secondary | ICD-10-CM

## 2017-08-05 ENCOUNTER — Telehealth: Payer: Self-pay | Admitting: Cardiovascular Disease

## 2017-08-05 LAB — BASIC METABOLIC PANEL
BUN/Creatinine Ratio: 17 (ref 9–23)
BUN: 11 mg/dL (ref 6–24)
CALCIUM: 9.3 mg/dL (ref 8.7–10.2)
CO2: 28 mmol/L (ref 20–29)
Chloride: 99 mmol/L (ref 96–106)
Creatinine, Ser: 0.66 mg/dL (ref 0.57–1.00)
GFR calc Af Amer: 112 mL/min/{1.73_m2} (ref >59)
GFR, EST NON AFRICAN AMERICAN: 97 mL/min/{1.73_m2} (ref >59)
GLUCOSE: 95 mg/dL (ref 65–99)
POTASSIUM: 3.9 mmol/L (ref 3.5–5.2)
SODIUM: 141 mmol/L (ref 134–144)

## 2017-08-05 LAB — MAGNESIUM: Magnesium: 2 mg/dL (ref 1.6–2.3)

## 2017-08-05 NOTE — Telephone Encounter (Signed)
New Message ° ° pt verbalized that she is returning call for rn °

## 2017-08-08 ENCOUNTER — Other Ambulatory Visit: Payer: Self-pay | Admitting: Cardiovascular Disease

## 2017-08-15 ENCOUNTER — Other Ambulatory Visit: Payer: Self-pay | Admitting: Internal Medicine

## 2017-08-31 ENCOUNTER — Ambulatory Visit (INDEPENDENT_AMBULATORY_CARE_PROVIDER_SITE_OTHER): Payer: BC Managed Care – PPO | Admitting: *Deleted

## 2017-08-31 DIAGNOSIS — I480 Paroxysmal atrial fibrillation: Secondary | ICD-10-CM

## 2017-08-31 NOTE — Progress Notes (Signed)
Carelink Summary Report / Loop Recorder 

## 2017-09-01 LAB — CUP PACEART REMOTE DEVICE CHECK
MDC IDC PG IMPLANT DT: 20180523
MDC IDC SESS DTM: 20181024163742

## 2017-09-02 NOTE — Telephone Encounter (Signed)
Lab results mailed to patient.

## 2017-09-12 ENCOUNTER — Telehealth: Payer: Self-pay | Admitting: *Deleted

## 2017-09-12 NOTE — Telephone Encounter (Signed)
LMOVM requesting call back to [facility].  Gave direct phone number. 

## 2017-09-12 NOTE — Telephone Encounter (Signed)
-----   Message from Sanda Klein, MD sent at 09/05/2017  5:28 PM EDT ----- Normal implantable loop recorder download. Frequent episodes of atrial flutter and atrial fibrillation with rapid ventricular response.  Severe tachycardia, especially during atrial flutter.  Overall burden of arrhythmia has increased substantially. Normal battery status. Please increase atenolol to 100 mg daily

## 2017-09-15 ENCOUNTER — Encounter: Payer: Self-pay | Admitting: Cardiovascular Disease

## 2017-09-15 ENCOUNTER — Ambulatory Visit: Payer: BC Managed Care – PPO | Admitting: Cardiovascular Disease

## 2017-09-15 VITALS — BP 130/96 | HR 84 | Ht 65.0 in | Wt 354.2 lb

## 2017-09-15 DIAGNOSIS — R7303 Prediabetes: Secondary | ICD-10-CM | POA: Diagnosis not present

## 2017-09-15 DIAGNOSIS — Z7901 Long term (current) use of anticoagulants: Secondary | ICD-10-CM | POA: Diagnosis not present

## 2017-09-15 DIAGNOSIS — I480 Paroxysmal atrial fibrillation: Secondary | ICD-10-CM

## 2017-09-15 DIAGNOSIS — I484 Atypical atrial flutter: Secondary | ICD-10-CM | POA: Diagnosis not present

## 2017-09-15 DIAGNOSIS — I10 Essential (primary) hypertension: Secondary | ICD-10-CM | POA: Diagnosis not present

## 2017-09-15 DIAGNOSIS — Z6841 Body Mass Index (BMI) 40.0 and over, adult: Secondary | ICD-10-CM

## 2017-09-15 MED ORDER — ATENOLOL 100 MG PO TABS: 100.0000 mg | ORAL_TABLET | Freq: Every day | ORAL | 3 refills | Status: DC

## 2017-09-15 NOTE — Patient Instructions (Signed)
Dr Sallyanne Kuster has recommended making the following medication changes: 1. INCREASE Atenolol to 100 mg daily  Your physician recommends that you schedule a follow-up appointment in 3-4 months.  If you need a refill on your cardiac medications before your next appointment, please call your pharmacy.
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
## 2017-09-15 NOTE — Telephone Encounter (Signed)
Patient was seen by Dr. Sallyanne Kuster on 09/15/17 in office, medication change was discussed then.  Encounter closed.

## 2017-09-27 ENCOUNTER — Telehealth: Payer: Self-pay | Admitting: Cardiology

## 2017-09-27 NOTE — Telephone Encounter (Signed)
LMOVM requesting that pt send manual transmission b/c home monitor has not updated in at least 14 days.    

## 2017-10-03 ENCOUNTER — Ambulatory Visit (INDEPENDENT_AMBULATORY_CARE_PROVIDER_SITE_OTHER): Payer: BC Managed Care – PPO | Admitting: *Deleted

## 2017-10-03 DIAGNOSIS — I480 Paroxysmal atrial fibrillation: Secondary | ICD-10-CM | POA: Diagnosis not present

## 2017-10-03 NOTE — Progress Notes (Signed)
Carelink Summary Report / Loop Recorder 

## 2017-10-13 ENCOUNTER — Other Ambulatory Visit: Payer: Self-pay

## 2017-10-13 LAB — CUP PACEART REMOTE DEVICE CHECK
MDC IDC PG IMPLANT DT: 20180523
MDC IDC SESS DTM: 20181123170810

## 2017-10-14 ENCOUNTER — Other Ambulatory Visit: Payer: Self-pay | Admitting: Cardiovascular Disease

## 2017-10-24 ENCOUNTER — Emergency Department (HOSPITAL_COMMUNITY): Payer: BC Managed Care – PPO

## 2017-10-24 ENCOUNTER — Inpatient Hospital Stay (HOSPITAL_COMMUNITY)
Admission: EM | Admit: 2017-10-24 | Discharge: 2017-10-27 | DRG: 309 | Disposition: A | Discharge status: Home / Self Care | Payer: BC Managed Care – PPO | Attending: Cardiovascular Disease | Admitting: Cardiovascular Disease

## 2017-10-24 ENCOUNTER — Other Ambulatory Visit: Payer: Self-pay

## 2017-10-24 ENCOUNTER — Encounter (HOSPITAL_COMMUNITY): Payer: Self-pay

## 2017-10-24 DIAGNOSIS — I4891 Unspecified atrial fibrillation: Secondary | ICD-10-CM | POA: Diagnosis present

## 2017-10-24 DIAGNOSIS — E785 Hyperlipidemia, unspecified: Secondary | ICD-10-CM | POA: Diagnosis present

## 2017-10-24 DIAGNOSIS — I440 Atrioventricular block, first degree: Secondary | ICD-10-CM | POA: Diagnosis present

## 2017-10-24 DIAGNOSIS — I484 Atypical atrial flutter: Principal | ICD-10-CM | POA: Diagnosis present

## 2017-10-24 DIAGNOSIS — R55 Syncope and collapse: Secondary | ICD-10-CM | POA: Diagnosis present

## 2017-10-24 DIAGNOSIS — I10 Essential (primary) hypertension: Secondary | ICD-10-CM | POA: Diagnosis present

## 2017-10-24 DIAGNOSIS — F419 Anxiety disorder, unspecified: Secondary | ICD-10-CM | POA: Diagnosis present

## 2017-10-24 DIAGNOSIS — I480 Paroxysmal atrial fibrillation: Secondary | ICD-10-CM

## 2017-10-24 DIAGNOSIS — I459 Conduction disorder, unspecified: Secondary | ICD-10-CM

## 2017-10-24 DIAGNOSIS — Z7984 Long term (current) use of oral hypoglycemic drugs: Secondary | ICD-10-CM

## 2017-10-24 DIAGNOSIS — Z6841 Body Mass Index (BMI) 40.0 and over, adult: Secondary | ICD-10-CM

## 2017-10-24 DIAGNOSIS — E119 Type 2 diabetes mellitus without complications: Secondary | ICD-10-CM | POA: Diagnosis present

## 2017-10-24 DIAGNOSIS — E669 Obesity, unspecified: Secondary | ICD-10-CM | POA: Diagnosis present

## 2017-10-24 DIAGNOSIS — F329 Major depressive disorder, single episode, unspecified: Secondary | ICD-10-CM | POA: Diagnosis present

## 2017-10-24 DIAGNOSIS — Z7901 Long term (current) use of anticoagulants: Secondary | ICD-10-CM

## 2017-10-24 DIAGNOSIS — K219 Gastro-esophageal reflux disease without esophagitis: Secondary | ICD-10-CM | POA: Diagnosis present

## 2017-10-24 DIAGNOSIS — I4892 Unspecified atrial flutter: Secondary | ICD-10-CM | POA: Diagnosis present

## 2017-10-24 DIAGNOSIS — E1169 Type 2 diabetes mellitus with other specified complication: Secondary | ICD-10-CM | POA: Diagnosis not present

## 2017-10-24 LAB — BASIC METABOLIC PANEL
Anion gap: 11 (ref 5–15)
BUN: 11 mg/dL (ref 6–20)
CO2: 27 mmol/L (ref 22–32)
Calcium: 9.1 mg/dL (ref 8.9–10.3)
Chloride: 101 mmol/L (ref 101–111)
Creatinine, Ser: 0.85 mg/dL (ref 0.44–1.00)
GFR calc Af Amer: >60 mL/min (ref >60)
GFR calc non Af Amer: >60 mL/min (ref >60)
Glucose, Bld: 127 mg/dL — ABNORMAL HIGH (ref 65–99)
Potassium: 4.4 mmol/L (ref 3.5–5.1)
Sodium: 139 mmol/L (ref 135–145)

## 2017-10-24 LAB — URINALYSIS, ROUTINE W REFLEX MICROSCOPIC
Bilirubin Urine: NEGATIVE
Glucose, UA: NEGATIVE mg/dL
Hgb urine dipstick: NEGATIVE
Ketones, ur: NEGATIVE mg/dL
Nitrite: NEGATIVE
Protein, ur: NEGATIVE mg/dL
Specific Gravity, Urine: 1.008 (ref 1.005–1.030)
pH: 7 (ref 5.0–8.0)

## 2017-10-24 LAB — CBC WITH DIFFERENTIAL/PLATELET
Basophils Absolute: 0 10*3/uL (ref 0.0–0.1)
Basophils Relative: 0 %
Eosinophils Absolute: 0 10*3/uL (ref 0.0–0.7)
Eosinophils Relative: 0 %
HCT: 45.5 % (ref 36.0–46.0)
Hemoglobin: 14.5 g/dL (ref 12.0–15.0)
Lymphocytes Relative: 23 %
Lymphs Abs: 2.1 10*3/uL (ref 0.7–4.0)
MCH: 29.3 pg (ref 26.0–34.0)
MCHC: 31.9 g/dL (ref 30.0–36.0)
MCV: 91.9 fL (ref 78.0–100.0)
Monocytes Absolute: 0.4 10*3/uL (ref 0.1–1.0)
Monocytes Relative: 4 %
Neutro Abs: 6.7 10*3/uL (ref 1.7–7.7)
Neutrophils Relative %: 73 %
Platelets: 288 10*3/uL (ref 150–400)
RBC: 4.95 MIL/uL (ref 3.87–5.11)
RDW: 13.7 % (ref 11.5–15.5)
WBC: 9.3 10*3/uL (ref 4.0–10.5)

## 2017-10-24 LAB — I-STAT TROPONIN, ED
Troponin i, poc: 0.1 ng/mL (ref 0.00–0.08)
Troponin i, poc: 0.19 ng/mL (ref 0.00–0.08)

## 2017-10-24 LAB — GLUCOSE, CAPILLARY: GLUCOSE-CAPILLARY: 135 mg/dL — AB (ref 65–99)

## 2017-10-24 LAB — TROPONIN I: TROPONIN I: 0.05 ng/mL — AB (ref <0.03)

## 2017-10-24 LAB — TSH: TSH: 2.511 u[IU]/mL (ref 0.350–4.500)

## 2017-10-24 MED ORDER — SODIUM CHLORIDE 0.9% FLUSH
3.0000 mL | Freq: Two times a day (BID) | INTRAVENOUS | Status: DC
  Administered 2017-10-24 – 2017-10-25 (×3): 3 mL via INTRAVENOUS

## 2017-10-24 MED ORDER — ATENOLOL 100 MG PO TABS
100.0000 mg | ORAL_TABLET | Freq: Two times a day (BID) | ORAL | Status: DC
  Administered 2017-10-24 – 2017-10-27 (×6): 100 mg via ORAL
  Filled 2017-10-24 (×8): qty 1

## 2017-10-24 MED ORDER — SODIUM CHLORIDE 0.9% FLUSH
3.0000 mL | INTRAVENOUS | Status: DC | PRN
  Administered 2017-10-26: 3 mL via INTRAVENOUS
  Filled 2017-10-24: qty 3

## 2017-10-24 MED ORDER — LISINOPRIL-HYDROCHLOROTHIAZIDE 20-25 MG PO TABS: 1.0000 | ORAL_TABLET | Freq: Every day | ORAL | Status: DC

## 2017-10-24 MED ORDER — APIXABAN 5 MG PO TABS
5.0000 mg | ORAL_TABLET | Freq: Two times a day (BID) | ORAL | Status: DC
  Administered 2017-10-24 – 2017-10-27 (×6): 5 mg via ORAL
  Filled 2017-10-24 (×6): qty 1

## 2017-10-24 MED ORDER — INSULIN ASPART 100 UNIT/ML ~~LOC~~ SOLN: 0.0000 [IU] | Freq: Three times a day (TID) | SUBCUTANEOUS | Status: DC

## 2017-10-24 MED ORDER — SODIUM CHLORIDE 0.9 % IV SOLN
250.0000 mL | INTRAVENOUS | Status: DC | PRN
  Administered 2017-10-27: 12:00:00 via INTRAVENOUS

## 2017-10-24 MED ORDER — ACETAMINOPHEN 325 MG PO TABS: 650.0000 mg | ORAL_TABLET | ORAL | Status: DC | PRN

## 2017-10-24 MED ORDER — LISINOPRIL 20 MG PO TABS
20.0000 mg | ORAL_TABLET | Freq: Every day | ORAL | Status: DC
  Administered 2017-10-25 – 2017-10-27 (×3): 20 mg via ORAL
  Filled 2017-10-24 (×3): qty 1

## 2017-10-24 MED ORDER — ATORVASTATIN CALCIUM 10 MG PO TABS
10.0000 mg | ORAL_TABLET | Freq: Every day | ORAL | Status: DC
Start: 2017-10-25 — End: 2017-10-27
  Administered 2017-10-25 – 2017-10-27 (×3): 10 mg via ORAL
  Filled 2017-10-24 (×3): qty 1

## 2017-10-24 MED ORDER — SODIUM CHLORIDE 0.9 % IV BOLUS (SEPSIS)
1000.0000 mL | Freq: Once | INTRAVENOUS | Status: AC
  Administered 2017-10-24: 1000 mL via INTRAVENOUS

## 2017-10-24 MED ORDER — HYDROCHLOROTHIAZIDE 25 MG PO TABS
25.0000 mg | ORAL_TABLET | Freq: Every day | ORAL | Status: DC
  Administered 2017-10-25 – 2017-10-27 (×3): 25 mg via ORAL
  Filled 2017-10-24 (×3): qty 1

## 2017-10-24 MED ORDER — ONDANSETRON HCL 4 MG/2ML IJ SOLN: 4.0000 mg | Freq: Four times a day (QID) | INTRAMUSCULAR | Status: DC | PRN

## 2017-10-24 NOTE — ED Notes (Signed)
Attempted report x1. 

## 2017-10-24 NOTE — ED Triage Notes (Signed)
[facility]- pt coming from work, felt she was feeling dizzy, felt like her heart was racing. Had a syncopal episode. Hx of atrial flutter. EKG showed a-fib. She takes atenolol and lisinopril. CBG was 176. Denies chest pain.

## 2017-10-24 NOTE — Progress Notes (Signed)
New Admission Note:   Arrival Method: ED bed with ED tech, ambu;ated to unit bed Mental Orientation: A&O x4 Telemetry: initiated, verified, box 20, a. fib Skin: WDL Pain: denies Tubes: IV saline locked Safety Measures: implemented  Orders to be reviewed and implemented. Will continue to monitor the patient. Call light has been placed within reach and bed alarm has been activated.   Riki Altes, RN Phone: [phone]

## 2017-10-24 NOTE — ED Notes (Signed)
Pt ambulated to restroom from room, tolerated well. No c/o dizziness or lightheadedness. Pt had bowel movement on herself, was able to get her cleaned up. Pt is now back in bed.

## 2017-10-24 NOTE — ED Notes (Signed)
Admitting MD at bedside.
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
## 2017-10-24 NOTE — ED Notes (Signed)
Lab results reported to Dr.Lockwood. 
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
## 2017-10-24 NOTE — ED Notes (Signed)
Elevated I-stat trop reported to [employer]
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
## 2017-10-24 NOTE — Progress Notes (Signed)
Patient has an elevated troponin of 0.05. (call from lab). MD notified

## 2017-10-25 ENCOUNTER — Other Ambulatory Visit (HOSPITAL_COMMUNITY): Payer: BC Managed Care – PPO

## 2017-10-25 ENCOUNTER — Observation Stay (HOSPITAL_COMMUNITY): Payer: BC Managed Care – PPO

## 2017-10-25 DIAGNOSIS — E1169 Type 2 diabetes mellitus with other specified complication: Secondary | ICD-10-CM

## 2017-10-25 DIAGNOSIS — I459 Conduction disorder, unspecified: Secondary | ICD-10-CM | POA: Diagnosis not present

## 2017-10-25 DIAGNOSIS — R55 Syncope and collapse: Secondary | ICD-10-CM

## 2017-10-25 DIAGNOSIS — E669 Obesity, unspecified: Secondary | ICD-10-CM

## 2017-10-25 DIAGNOSIS — I209 Angina pectoris, unspecified: Secondary | ICD-10-CM

## 2017-10-25 DIAGNOSIS — I480 Paroxysmal atrial fibrillation: Secondary | ICD-10-CM | POA: Diagnosis not present

## 2017-10-25 DIAGNOSIS — I484 Atypical atrial flutter: Secondary | ICD-10-CM | POA: Diagnosis not present

## 2017-10-25 LAB — BASIC METABOLIC PANEL
ANION GAP: 9 (ref 5–15)
BUN: 9 mg/dL (ref 6–20)
CALCIUM: 8.8 mg/dL — AB (ref 8.9–10.3)
CO2: 27 mmol/L (ref 22–32)
Chloride: 103 mmol/L (ref 101–111)
Creatinine, Ser: 0.74 mg/dL (ref 0.44–1.00)
GFR calc Af Amer: >60 mL/min (ref >60)
GFR calc non Af Amer: >60 mL/min (ref >60)
GLUCOSE: 114 mg/dL — AB (ref 65–99)
Potassium: 3.6 mmol/L (ref 3.5–5.1)
Sodium: 139 mmol/L (ref 135–145)

## 2017-10-25 LAB — URINE CULTURE

## 2017-10-25 LAB — LIPID PANEL
Cholesterol: 170 mg/dL (ref 0–200)
HDL: 71 mg/dL (ref >40)
LDL CALC: 87 mg/dL (ref 0–99)
TRIGLYCERIDES: 59 mg/dL (ref <150)
Total CHOL/HDL Ratio: 2.4 RATIO
VLDL: 12 mg/dL (ref 0–40)

## 2017-10-25 LAB — GLUCOSE, CAPILLARY
GLUCOSE-CAPILLARY: 103 mg/dL — AB (ref 65–99)
GLUCOSE-CAPILLARY: 89 mg/dL (ref 65–99)
GLUCOSE-CAPILLARY: 84 mg/dL (ref 65–99)
Glucose-Capillary: 169 mg/dL — ABNORMAL HIGH (ref 65–99)
Glucose-Capillary: 154 mg/dL — ABNORMAL HIGH (ref 65–99)

## 2017-10-25 LAB — CBC
HCT: 41.3 % (ref 36.0–46.0)
Hemoglobin: 13.3 g/dL (ref 12.0–15.0)
MCH: 29.4 pg (ref 26.0–34.0)
MCHC: 32.2 g/dL (ref 30.0–36.0)
MCV: 91.2 fL (ref 78.0–100.0)
PLATELETS: 264 10*3/uL (ref 150–400)
RBC: 4.53 MIL/uL (ref 3.87–5.11)
RDW: 13.7 % (ref 11.5–15.5)
WBC: 7.4 10*3/uL (ref 4.0–10.5)

## 2017-10-25 LAB — HIV ANTIBODY (ROUTINE TESTING W REFLEX): HIV SCREEN 4TH GENERATION: NONREACTIVE

## 2017-10-25 LAB — TROPONIN I
TROPONIN I: 0.03 ng/mL — AB (ref <0.03)
Troponin I: <0.03 ng/mL (ref <0.03)

## 2017-10-25 LAB — TSH: TSH: 1.784 u[IU]/mL (ref 0.350–4.500)

## 2017-10-25 MED ORDER — FLECAINIDE ACETATE 100 MG PO TABS
300.0000 mg | ORAL_TABLET | Freq: Once | ORAL | Status: AC
  Administered 2017-10-25: 300 mg via ORAL
  Filled 2017-10-25: qty 3

## 2017-10-25 MED ORDER — REGADENOSON 0.4 MG/5ML IV SOLN
INTRAVENOUS | Status: AC
  Filled 2017-10-25: qty 5

## 2017-10-25 MED ORDER — TECHNETIUM TC 99M TETROFOSMIN IV KIT: 10.0000 | PACK | Freq: Once | INTRAVENOUS | Status: DC | PRN

## 2017-10-25 MED ORDER — REGADENOSON 0.4 MG/5ML IV SOLN
0.4000 mg | Freq: Once | INTRAVENOUS | Status: AC
  Administered 2017-10-25: 0.4 mg via INTRAVENOUS

## 2017-10-25 MED ORDER — METOPROLOL TARTRATE 5 MG/5ML IV SOLN
INTRAVENOUS | Status: AC
  Filled 2017-10-25: qty 5

## 2017-10-25 MED ORDER — FLECAINIDE ACETATE 100 MG PO TABS: 200.0000 mg | ORAL_TABLET | Freq: Once | ORAL | Status: DC

## 2017-10-25 NOTE — Progress Notes (Signed)
Call from [facility], transport will be to get patient for stress test soon.

## 2017-10-25 NOTE — Plan of Care (Signed)
  Health Behavior/Discharge Planning: Ability to manage health-related needs will improve 10/25/2017 0031 - Progressing by Tristan Schroeder, RN   Clinical Measurements: Ability to maintain clinical measurements within normal limits will improve 10/25/2017 0031 - Progressing by Tristan Schroeder, RN

## 2017-10-25 NOTE — Progress Notes (Signed)
Patient states she's been feeling a little tired/weak since she had the episode of  SVT earlier this evening, she says she thinks it may be due to over exerting herself and a new medication that was started today. Will continue to monitor.

## 2017-10-25 NOTE — Progress Notes (Signed)
Ettrick notified this Rthat pt's HR was 209.   1821 - Arrived to pt'r room, with Sharetta, NT, assisting pt to remain in bed, as pt had been ambulating in order to wash up. 1822 - Waynetta Sandy, RN arrived to room to assist. [place], PA-Cards notified of pt's change in condition.  She was symptomatic describing "extreme weakness in all limbs."  Hela, Rapid Response nurse arrived to room. 1825 - CBG = 169.  EKG performed resulting a-flutter with RVR 204. 1826 - Lauren asked pt to vegal and cough.  Pt attempted without success. 35 - Dr. Martinique and Doreene Adas, PA-Cards arrived to unit.  Pt's HR beginning to correct to 134, then 89.  2nd EKG performed resulting 1st degree AVB, HR 89.  Pt now aymptomatic, and resting comfortably, stating that she "feels much better."  No orders received.
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
## 2017-10-25 NOTE — Plan of Care (Signed)
  Safety: Ability to remain free from injury will improve 10/25/2017 2355 - Progressing by Tristan Schroeder, RN   Clinical Measurements: Cardiovascular complication will be avoided 10/25/2017 2355 - Progressing by Tristan Schroeder, RN

## 2017-10-25 NOTE — Progress Notes (Signed)
   Vicki Wheeler presented for a nuclear stress test today.  No immediate complications.  Stress imaging is pending at this time.  Preliminary EKG findings may be listed in the chart, but the stress test result will not be finalized until perfusion imaging is complete.  Tami Lin Dian Laprade, PA-C 10/25/2017, 9:21 AM

## 2017-10-25 NOTE — Plan of Care (Signed)
  Education: Knowledge of General Education information will improve 10/25/2017 1125 - Progressing by Imagene Gurney, RN

## 2017-10-25 NOTE — Progress Notes (Signed)
Patient in narrow complex SVT rate 190s.  She states that she feels very fatigued.  Per staff she was sitting on the side of the bed preparing to wash up.  Assisted back to lying in bed.  12 lead EKG done.  Patient states she was starting to feel much better.  Spontaneously converted to [place].  12 lead EKG done.  Dr Martinique at bedside to assess patient.  RN to call if assistance needed

## 2017-10-25 NOTE — Progress Notes (Signed)
Progress Note  Patient Name: Vicki Wheeler Date of Encounter: 10/25/2017  Primary Cardiologist: No primary care provider on file.   Subjective   No events  Overnight. No pre-syncope/angina/dyspnea. Leaving to nuclear med.  Inpatient Medications    Scheduled Meds: . apixaban  5 mg Oral BID  . atenolol  100 mg Oral BID  . atorvastatin  10 mg Oral Daily  . hydrochlorothiazide  25 mg Oral Daily  . insulin aspart  0-15 Units Subcutaneous TID WC  . lisinopril  20 mg Oral Daily  . sodium chloride flush  3 mL Intravenous Q12H   Continuous Infusions: . sodium chloride     PRN Meds: sodium chloride, acetaminophen, ondansetron (ZOFRAN) IV, sodium chloride flush, technetium tetrofosmin   Vital Signs    Vitals:   10/25/17 0035 10/25/17 0650 10/25/17 0653 10/25/17 0856  BP: (!) 103/48   (!) 152/89  Pulse: 82   91  Resp: 18 18    Temp: 98.8 F (37.1 C) 98.2 F (36.8 C)    TempSrc: Oral Oral    SpO2: 100% 99%    Weight:   (!) 347 lb (157.4 kg)   Height:        Intake/Output Summary (Last 24 hours) at 10/25/2017 0902 Last data filed at 10/24/2017 2312 Gross per 24 hour  Intake 1443 ml  Output 200 ml  Net 1243 ml   Filed Weights   10/24/17 1024 10/24/17 2038 10/25/17 0653  Weight: (!) 352 lb (159.7 kg) (!) 347 lb 6.4 oz (157.6 kg) (!) 347 lb (157.4 kg)    Telemetry    AFlutter w variable AV block, mostly 80s. - Personally Reviewed  ECG    No new tracing - Personally Reviewed  Physical Exam  Morbidly obese GEN: No acute distress.   Neck: No JVD Cardiac: irregular, no murmurs, rubs, or gallops.  Respiratory: Clear to auscultation bilaterally. GI: Soft, nontender, non-distended  MS: No edema; No deformity. Neuro:  Nonfocal  Psych: Normal affect   Labs    Chemistry Recent Labs  Lab 10/24/17 1200  NA 139  K 4.4  CL 101  CO2 27  GLUCOSE 127*  BUN 11  CREATININE 0.85  CALCIUM 9.1  GFRNONAA >60  GFRAA >60  ANIONGAP 11     Hematology Recent  Labs  Lab 10/24/17 1200  WBC 9.3  RBC 4.95  HGB 14.5  HCT 45.5  MCV 91.9  MCH 29.3  MCHC 31.9  RDW 13.7  PLT 288    Cardiac Enzymes Recent Labs  Lab 10/24/17 2040 10/25/17 0229  TROPONINI 0.05* 0.03*    Recent Labs  Lab 10/24/17 1210 10/24/17 1504  TROPIPOC 0.10* 0.19*     BNPNo results for input(s): BNP, PROBNP in the last 168 hours.   DDimer No results for input(s): DDIMER in the last 168 hours.   Radiology    Dg Chest 2 View  Result Date: 10/24/2017 CLINICAL DATA:  Syncope EXAM: CHEST  2 VIEW COMPARISON:  01/18/2017 chest radiograph. FINDINGS: Loop recorder overlies the left heart. Stable cardiomediastinal silhouette with mild cardiomegaly. No pneumothorax. No pleural effusion. No overt pulmonary edema. No acute consolidative airspace disease. IMPRESSION: Stable mild cardiomegaly without overt pulmonary edema. Electronically Signed   By: Ilona Sorrel M.D.   On: 10/24/2017 13:03    Cardiac Studies   Lexiscan Myoview today. Echo ordered.  Patient Profile     61 y.o. female with syncope due to atypical atrial flutter with 1:1 AV conduction, history of previous  cavotricuspid isthmus ablation, morbid obesity, DM, HTN, loop recorder in place.  Assessment & Plan    1. Aflutter - pt has been compliant on all medications, including eliquis and atenolol; she has 30 mg cardizem to take for palpitations (about once per week for palpitations/racing heart) - increased her atenolol to 100 mg BID - EP consult requested, considering flecainide - minor troponin increase attributable to extreme tachycardia, not a true acute ischemic event - screen for CAD with Myoview - results will be available tomorrow PM 2.  HTN - conitnue lisinopril-HCTZ, may need to decrease it if she stays on higher dose atenolol and BP is low 3. HLD - on statin.  4. DM - controlled    For questions or updates, please contact [place] Please consult [email or web address] for contact info under  Cardiology/STEMI.      Signed, Sanda Klein, MD  10/25/2017, 9:02 AM

## 2017-10-26 ENCOUNTER — Other Ambulatory Visit (HOSPITAL_COMMUNITY): Payer: BC Managed Care – PPO

## 2017-10-26 ENCOUNTER — Observation Stay (HOSPITAL_BASED_OUTPATIENT_CLINIC_OR_DEPARTMENT_OTHER): Payer: BC Managed Care – PPO

## 2017-10-26 DIAGNOSIS — Z6841 Body Mass Index (BMI) 40.0 and over, adult: Secondary | ICD-10-CM | POA: Diagnosis not present

## 2017-10-26 DIAGNOSIS — E785 Hyperlipidemia, unspecified: Secondary | ICD-10-CM | POA: Diagnosis present

## 2017-10-26 DIAGNOSIS — I4891 Unspecified atrial fibrillation: Secondary | ICD-10-CM | POA: Diagnosis present

## 2017-10-26 DIAGNOSIS — I459 Conduction disorder, unspecified: Secondary | ICD-10-CM | POA: Diagnosis not present

## 2017-10-26 DIAGNOSIS — I480 Paroxysmal atrial fibrillation: Secondary | ICD-10-CM | POA: Diagnosis not present

## 2017-10-26 DIAGNOSIS — I484 Atypical atrial flutter: Secondary | ICD-10-CM | POA: Diagnosis present

## 2017-10-26 DIAGNOSIS — R55 Syncope and collapse: Secondary | ICD-10-CM | POA: Diagnosis present

## 2017-10-26 DIAGNOSIS — I340 Nonrheumatic mitral (valve) insufficiency: Secondary | ICD-10-CM

## 2017-10-26 DIAGNOSIS — F329 Major depressive disorder, single episode, unspecified: Secondary | ICD-10-CM | POA: Diagnosis present

## 2017-10-26 DIAGNOSIS — E119 Type 2 diabetes mellitus without complications: Secondary | ICD-10-CM | POA: Diagnosis present

## 2017-10-26 DIAGNOSIS — Z7901 Long term (current) use of anticoagulants: Secondary | ICD-10-CM | POA: Diagnosis not present

## 2017-10-26 DIAGNOSIS — I440 Atrioventricular block, first degree: Secondary | ICD-10-CM | POA: Diagnosis present

## 2017-10-26 DIAGNOSIS — F419 Anxiety disorder, unspecified: Secondary | ICD-10-CM | POA: Diagnosis present

## 2017-10-26 DIAGNOSIS — Z7984 Long term (current) use of oral hypoglycemic drugs: Secondary | ICD-10-CM | POA: Diagnosis not present

## 2017-10-26 DIAGNOSIS — K219 Gastro-esophageal reflux disease without esophagitis: Secondary | ICD-10-CM | POA: Diagnosis present

## 2017-10-26 DIAGNOSIS — I10 Essential (primary) hypertension: Secondary | ICD-10-CM | POA: Diagnosis present

## 2017-10-26 LAB — GLUCOSE, CAPILLARY
GLUCOSE-CAPILLARY: 107 mg/dL — AB (ref 65–99)
GLUCOSE-CAPILLARY: 119 mg/dL — AB (ref 65–99)
GLUCOSE-CAPILLARY: 111 mg/dL — AB (ref 65–99)
Glucose-Capillary: 97 mg/dL (ref 65–99)

## 2017-10-26 LAB — NM MYOCAR MULTI W/SPECT W/WALL MOTION / EF
CHL CUP MPHR: 161 {beats}/min
CHL CUP RESTING HR STRESS: 71 {beats}/min
CSEPHR: 68 %
Peak HR: 111 {beats}/min

## 2017-10-26 LAB — HEMOGLOBIN A1C
Hgb A1c MFr Bld: 6.7 % — ABNORMAL HIGH (ref 4.8–5.6)
Mean Plasma Glucose: 145.59 mg/dL

## 2017-10-26 LAB — ECHOCARDIOGRAM COMPLETE
Height: 65 in
WEIGHTICAEL: 5528 [oz_av]

## 2017-10-26 MED ORDER — FLECAINIDE ACETATE 100 MG PO TABS
100.0000 mg | ORAL_TABLET | Freq: Two times a day (BID) | ORAL | Status: DC
  Administered 2017-10-26 – 2017-10-27 (×3): 100 mg via ORAL
  Filled 2017-10-26 (×4): qty 1

## 2017-10-26 MED ORDER — TECHNETIUM TC 99M TETROFOSMIN IV KIT: 30.0000 | PACK | Freq: Once | INTRAVENOUS | Status: DC | PRN

## 2017-10-26 NOTE — H&P (View-Only) (Signed)
   Progress Note   Subjective   Doing well today, the patient denies CP or SOB.  No new concerns  Inpatient Medications    Scheduled Meds: . apixaban  5 mg Oral BID  . atenolol  100 mg Oral BID  . atorvastatin  10 mg Oral Daily  . flecainide  100 mg Oral Q12H  . hydrochlorothiazide  25 mg Oral Daily  . insulin aspart  0-15 Units Subcutaneous TID WC  . lisinopril  20 mg Oral Daily  . sodium chloride flush  3 mL Intravenous Q12H   Continuous Infusions: . sodium chloride     PRN Meds: sodium chloride, acetaminophen, ondansetron (ZOFRAN) IV, sodium chloride flush, technetium tetrofosmin, technetium tetrofosmin, technetium tetrofosmin   Vital Signs    Vitals:   10/26/17 1040 10/26/17 1203 10/26/17 1936 10/26/17 2216  BP: 136/85 (!) 150/123 (!) 142/80 132/80  Pulse:  86 80 78  Resp:  20    Temp:  98.2 F (36.8 C) 98.2 F (36.8 C)   TempSrc:  Oral Oral   SpO2:  99% 98%   Weight:      Height:        Intake/Output Summary (Last 24 hours) at 10/26/2017 2357 Last data filed at 10/26/2017 1900 Gross per 24 hour  Intake 240 ml  Output 2400 ml  Net -2160 ml   Filed Weights   10/25/17 0653 10/26/17 0514 10/26/17 0609  Weight: (!) 347 lb (157.4 kg) (!) 356 lb 3.2 oz (161.6 kg) (!) 345 lb 8 oz (156.7 kg)    Telemetry    Atypical atrial flutter - Personally Reviewed  Physical Exam   GEN- The patient is morbidly obese appearing, alert and oriented x 3 today.   Head- normocephalic, atraumatic Eyes-  Sclera clear, conjunctiva pink Ears- hearing intact Oropharynx- clear Neck- supple, Lungs- Clear to ausculation bilaterally, normal work of breathing Heart- irregular rate and rhythm  GI- soft, NT, ND, + BS Extremities- no clubbing, cyanosis, or edema  MS- no significant deformity or atrophy Skin- no rash or lesion Psych- euthymic mood, full affect Neuro- strength and sensation are intact   Labs    Chemistry Recent Labs  Lab 10/24/17 1200 10/25/17 1032  NA  139 139  K 4.4 3.6  CL 101 103  CO2 27 27  GLUCOSE 127* 114*  BUN 11 9  CREATININE 0.85 0.74  CALCIUM 9.1 8.8*  GFRNONAA >60 >60  GFRAA >60 >60  ANIONGAP 11 9     Hematology Recent Labs  Lab 10/24/17 1200 10/25/17 1032  WBC 9.3 7.4  RBC 4.95 4.53  HGB 14.5 13.3  HCT 45.5 41.3  MCV 91.9 91.2  MCH 29.3 29.4  MCHC 31.9 32.2  RDW 13.7 13.7  PLT 288 264    Cardiac Enzymes Recent Labs  Lab 10/24/17 2040 10/25/17 0229 10/25/17 1032  TROPONINI 0.05* 0.03* <0.03    Recent Labs  Lab 10/24/17 1210 10/24/17 1504  TROPIPOC 0.10* 0.19*        Assessment & Plan    1.  Atypical atrial flutter Did not convert with oral flecainide Will continue flecainide 100mg  BID Plan cardioversion tomorrow Continue anticoagulation  2. Obesity Body mass index is 57.49 kg/m. Lifestyle modification encouraged She would benefit from bariatric referral at discharge  Thompson Grayer MD, [facility] 10/26/2017

## 2017-10-26 NOTE — Progress Notes (Signed)
Progress Note  Patient Name: Vicki Wheeler Date of Encounter: 10/26/2017  Primary Cardiologist: Sanda Klein, MD   Subjective   Remains in atrial flutter with mostly controlled ventricular rates around 80-90.  No change after flecainide 300 mg bolus dose.   Generally feels well, but became tachycardic when washing up at the sink with heart rate briefly increasing to about 140 bpm.  No syncope/presyncope.  Denies dyspnea.   Has just had the second set of images for her nuclear scan, but these are not processed.  Inpatient Medications    Scheduled Meds: . apixaban  5 mg Oral BID  . atenolol  100 mg Oral BID  . atorvastatin  10 mg Oral Daily  . hydrochlorothiazide  25 mg Oral Daily  . insulin aspart  0-15 Units Subcutaneous TID WC  . lisinopril  20 mg Oral Daily  . sodium chloride flush  3 mL Intravenous Q12H   Continuous Infusions: . sodium chloride     PRN Meds: sodium chloride, acetaminophen, ondansetron (ZOFRAN) IV, sodium chloride flush, technetium tetrofosmin, technetium tetrofosmin, technetium tetrofosmin   Vital Signs    Vitals:   10/25/17 1926 10/25/17 2147 10/26/17 0514 10/26/17 0609  BP: 117/64 126/77 138/63   Pulse: 80  84   Resp: 18  18   Temp: 98.2 F (36.8 C)  98.4 F (36.9 C)   TempSrc: Oral  Oral   SpO2: 98%  98%   Weight:   (!) 356 lb 3.2 oz (161.6 kg) (!) 345 lb 8 oz (156.7 kg)  Height:        Intake/Output Summary (Last 24 hours) at 10/26/2017 1025 Last data filed at 10/26/2017 1000 Gross per 24 hour  Intake 1135 ml  Output 3400 ml  Net -2265 ml   Filed Weights   10/25/17 0653 10/26/17 0514 10/26/17 0609  Weight: (!) 347 lb (157.4 kg) (!) 356 lb 3.2 oz (161.6 kg) (!) 345 lb 8 oz (156.7 kg)    Telemetry    Atrial flutter with mostly controlled ventricular rate, brief episodes of RVR- Personally Reviewed  ECG    ECG yesterday 1800 shows atypical atrial flutter- Personally Reviewed  Physical Exam  Morbidly obese GEN: No acute  distress.   Neck: No JVD Cardiac:  Irregular, no murmurs, rubs, or gallops.  Respiratory: Clear to auscultation bilaterally. GI: Soft, nontender, non-distended  MS: No edema; No deformity. Neuro:  Nonfocal  Psych: Normal affect   Labs    Chemistry Recent Labs  Lab 10/24/17 1200 10/25/17 1032  NA 139 139  K 4.4 3.6  CL 101 103  CO2 27 27  GLUCOSE 127* 114*  BUN 11 9  CREATININE 0.85 0.74  CALCIUM 9.1 8.8*  GFRNONAA >60 >60  GFRAA >60 >60  ANIONGAP 11 9     Hematology Recent Labs  Lab 10/24/17 1200 10/25/17 1032  WBC 9.3 7.4  RBC 4.95 4.53  HGB 14.5 13.3  HCT 45.5 41.3  MCV 91.9 91.2  MCH 29.3 29.4  MCHC 31.9 32.2  RDW 13.7 13.7  PLT 288 264    Cardiac Enzymes Recent Labs  Lab 10/24/17 2040 10/25/17 0229 10/25/17 1032  TROPONINI 0.05* 0.03* <0.03    Recent Labs  Lab 10/24/17 1210 10/24/17 1504  TROPIPOC 0.10* 0.19*     BNPNo results for input(s): BNP, PROBNP in the last 168 hours.   DDimer No results for input(s): DDIMER in the last 168 hours.   Radiology    Dg Chest 2 View  Result  Date: 10/24/2017 CLINICAL DATA:  Syncope EXAM: CHEST  2 VIEW COMPARISON:  01/18/2017 chest radiograph. FINDINGS: Loop recorder overlies the left heart. Stable cardiomediastinal silhouette with mild cardiomegaly. No pneumothorax. No pleural effusion. No overt pulmonary edema. No acute consolidative airspace disease. IMPRESSION: Stable mild cardiomegaly without overt pulmonary edema. Electronically Signed   By: Ilona Sorrel M.D.   On: 10/24/2017 13:03    Cardiac Studies   Nuclear images not yet formally reviewed, but on my brief evaluation show a small mid anterior wall defect that is most likely due to breast attenuation artifact.  There is a hint of reversibility and this could represent shifting breast attenuation.  EF calculated at 49%, likely not reliable in the setting of an irregular rhythm  Echo pending  Patient Profile     61 y.o. female syncope due to  atypical atrial flutter with 1:1 AV conduction, history of previous cavotricuspid isthmus ablation, morbid obesity, DM, HTN, loop recorder in place.  Assessment & Plan    1. Aflutter -Tolerating flecainide without side effects so far, but remains in atrial flutter.  Rate control is still challenging despite high-dose atenolol. - Wait for formal nuclear study review today.  Echo pending. - Due to previous findings at EP study and her morbid obesity she is not felt to be a good candidate for repeat ablation. - Will plan DCCV  - probably Friday (will have received 5 doses of flecainide by then). 2.  HTN -Blood pressure still in desirable range after increasing the atenolol dose.  Consider dropping the dose of lisinopril hydrochlorothiazide if her blood pressure becomes low.   3. HLD - on statin.  4. DM - controlled  For questions or updates, please contact [place] Please consult [email or web address] for contact info under Cardiology/STEMI.      Signed, Sanda Klein, MD  10/26/2017, 10:25 AM

## 2017-10-26 NOTE — Progress Notes (Signed)
   Progress Note   Subjective   Doing well today, the patient denies CP or SOB.  No new concerns  Inpatient Medications    Scheduled Meds: . apixaban  5 mg Oral BID  . atenolol  100 mg Oral BID  . atorvastatin  10 mg Oral Daily  . flecainide  100 mg Oral Q12H  . hydrochlorothiazide  25 mg Oral Daily  . insulin aspart  0-15 Units Subcutaneous TID WC  . lisinopril  20 mg Oral Daily  . sodium chloride flush  3 mL Intravenous Q12H   Continuous Infusions: . sodium chloride     PRN Meds: sodium chloride, acetaminophen, ondansetron (ZOFRAN) IV, sodium chloride flush, technetium tetrofosmin, technetium tetrofosmin, technetium tetrofosmin   Vital Signs    Vitals:   10/26/17 1040 10/26/17 1203 10/26/17 1936 10/26/17 2216  BP: 136/85 (!) 150/123 (!) 142/80 132/80  Pulse:  86 80 78  Resp:  20    Temp:  98.2 F (36.8 C) 98.2 F (36.8 C)   TempSrc:  Oral Oral   SpO2:  99% 98%   Weight:      Height:        Intake/Output Summary (Last 24 hours) at 10/26/2017 2357 Last data filed at 10/26/2017 1900 Gross per 24 hour  Intake 240 ml  Output 2400 ml  Net -2160 ml   Filed Weights   10/25/17 0653 10/26/17 0514 10/26/17 0609  Weight: (!) 347 lb (157.4 kg) (!) 356 lb 3.2 oz (161.6 kg) (!) 345 lb 8 oz (156.7 kg)    Telemetry    Atypical atrial flutter - Personally Reviewed  Physical Exam   GEN- The patient is morbidly obese appearing, alert and oriented x 3 today.   Head- normocephalic, atraumatic Eyes-  Sclera clear, conjunctiva pink Ears- hearing intact Oropharynx- clear Neck- supple, Lungs- Clear to ausculation bilaterally, normal work of breathing Heart- irregular rate and rhythm  GI- soft, NT, ND, + BS Extremities- no clubbing, cyanosis, or edema  MS- no significant deformity or atrophy Skin- no rash or lesion Psych- euthymic mood, full affect Neuro- strength and sensation are intact   Labs    Chemistry Recent Labs  Lab 10/24/17 1200 10/25/17 1032  NA  139 139  K 4.4 3.6  CL 101 103  CO2 27 27  GLUCOSE 127* 114*  BUN 11 9  CREATININE 0.85 0.74  CALCIUM 9.1 8.8*  GFRNONAA >60 >60  GFRAA >60 >60  ANIONGAP 11 9     Hematology Recent Labs  Lab 10/24/17 1200 10/25/17 1032  WBC 9.3 7.4  RBC 4.95 4.53  HGB 14.5 13.3  HCT 45.5 41.3  MCV 91.9 91.2  MCH 29.3 29.4  MCHC 31.9 32.2  RDW 13.7 13.7  PLT 288 264    Cardiac Enzymes Recent Labs  Lab 10/24/17 2040 10/25/17 0229 10/25/17 1032  TROPONINI 0.05* 0.03* <0.03    Recent Labs  Lab 10/24/17 1210 10/24/17 1504  TROPIPOC 0.10* 0.19*        Assessment & Plan    1.  Atypical atrial flutter Did not convert with oral flecainide Will continue flecainide 100mg  BID Plan cardioversion tomorrow Continue anticoagulation  2. Obesity Body mass index is 57.49 kg/m. Lifestyle modification encouraged She would benefit from bariatric referral at discharge  Thompson Grayer MD, [facility] 10/26/2017

## 2017-10-26 NOTE — Progress Notes (Signed)
Pt is up to the bathroom no distress, transporting to NM stress test.

## 2017-10-26 NOTE — Discharge Instructions (Signed)

## 2017-10-26 NOTE — Progress Notes (Signed)
Visited with this patient around [place].  Patient has her own copy of AD that she wants a friend to come by and take a look at.  She will notify nurse when/if she needs notarizing.  Please place another consult and we will come take care of.    10/26/17 1704  Clinical Encounter Type  Visited With Patient  Visit Type Initial;Psychological support  Spiritual Encounters  Spiritual Needs Literature (Advanced Directives)

## 2017-10-26 NOTE — Progress Notes (Signed)
  Echocardiogram 2D Echocardiogram has been performed.  Shylo Dillenbeck L Androw 10/26/2017, 11:44 AM

## 2017-10-26 NOTE — Plan of Care (Signed)
  RD consulted for nutrition education regarding weight loss.  Body mass index is 57.49 kg/m. Pt meets criteria for obesity, class III based on current BMI.  Spoke with pt and family members at bedside. Pt reports that she was instructed to lose weight per her cardiologist recommendations. She has been making efforts at home by drinking more water and using her air-fryer to bake foods. She is considering bariatric surgery, as she was told her needs to lose weight rapidly. She was very thoughtful in ways that she could improve her lifestyle. Her sister reports they like to cook and and have been trying to do this more often instead of eating out. Most of session was spent identifying ways pt could improve with lifestyle changes.  Emphasized the importance of serving sizes and provided examples of correct portions of common foods. Discussed importance of controlled and consistent intake throughout the day. Provided examples of ways to balance meals/snacks and encouraged intake of high-fiber, whole grain complex carbohydrates. Emphasized the importance of hydration with calorie-free beverages and limiting sugar-sweetened beverages. Encouraged pt to discuss physical activity options with physician. Teach back method used.  Expect fair to good compliance.  Current diet order is Heart Healthy/ Carb Modified, patient is consuming approximately 100% of meals at this time. Labs and medications reviewed. No further nutrition interventions warranted at this time. RD contact information provided. If additional nutrition issues arise, please re-consult RD.  Sandon Yoho A. Jimmye Norman, RD, LDN, CDE Pager: [phone] After hours Pager: [phone]

## 2017-10-26 NOTE — Progress Notes (Signed)
Patient states she feels much better this morning. HR stable with ambulation.

## 2017-10-27 ENCOUNTER — Encounter (HOSPITAL_COMMUNITY)
Admission: EM | Disposition: A | Discharge status: Home / Self Care | Payer: Self-pay | Source: Home / Self Care | Attending: Cardiovascular Disease

## 2017-10-27 ENCOUNTER — Inpatient Hospital Stay (HOSPITAL_COMMUNITY): Payer: BC Managed Care – PPO | Admitting: Certified Registered Nurse Anesthetist

## 2017-10-27 ENCOUNTER — Encounter (HOSPITAL_COMMUNITY): Payer: Self-pay | Admitting: *Deleted

## 2017-10-27 ENCOUNTER — Telehealth: Payer: Self-pay | Admitting: *Deleted

## 2017-10-27 DIAGNOSIS — I484 Atypical atrial flutter: Secondary | ICD-10-CM

## 2017-10-27 DIAGNOSIS — G4733 Obstructive sleep apnea (adult) (pediatric): Secondary | ICD-10-CM

## 2017-10-27 DIAGNOSIS — I480 Paroxysmal atrial fibrillation: Secondary | ICD-10-CM

## 2017-10-27 HISTORY — PX: CARDIOVERSION: SHX1299

## 2017-10-27 LAB — GLUCOSE, CAPILLARY
GLUCOSE-CAPILLARY: 109 mg/dL — AB (ref 65–99)
Glucose-Capillary: 125 mg/dL — ABNORMAL HIGH (ref 65–99)

## 2017-10-27 SURGERY — CARDIOVERSION
Anesthesia: General

## 2017-10-27 MED ORDER — APIXABAN 5 MG PO TABS: 5.0000 mg | ORAL_TABLET | Freq: Two times a day (BID) | ORAL | 3 refills | Status: DC

## 2017-10-27 MED ORDER — PROMETHAZINE HCL 25 MG/ML IJ SOLN: 6.2500 mg | INTRAMUSCULAR | Status: DC | PRN

## 2017-10-27 MED ORDER — ATENOLOL 100 MG PO TABS: 100.0000 mg | ORAL_TABLET | Freq: Two times a day (BID) | ORAL | 3 refills | Status: DC

## 2017-10-27 MED ORDER — PROPOFOL 10 MG/ML IV BOLUS
INTRAVENOUS | Status: DC | PRN
  Administered 2017-10-27: 100 mg via INTRAVENOUS

## 2017-10-27 MED ORDER — MEPERIDINE HCL 25 MG/ML IJ SOLN: 6.2500 mg | INTRAMUSCULAR | Status: DC | PRN

## 2017-10-27 MED ORDER — FLECAINIDE ACETATE 100 MG PO TABS: 100.0000 mg | ORAL_TABLET | Freq: Two times a day (BID) | ORAL | 3 refills | Status: DC

## 2017-10-27 MED ORDER — MIDAZOLAM HCL 2 MG/2ML IJ SOLN: 0.5000 mg | Freq: Once | INTRAMUSCULAR | Status: DC | PRN

## 2017-10-27 MED ORDER — LIDOCAINE 2% (20 MG/ML) 5 ML SYRINGE
INTRAMUSCULAR | Status: DC | PRN
  Administered 2017-10-27: 40 mg via INTRAVENOUS

## 2017-10-27 NOTE — Telephone Encounter (Signed)
Sent to sleep pool. 

## 2017-10-27 NOTE — Discharge Summary (Signed)
Discharge Summary    [name],  MRN: [id number], DOB/AGE: 1956/01/12 61 y.o.  Admit date: 10/24/2017 Discharge date: 10/27/2017  Primary Care Provider: Shanon Rosser Primary Cardiologist: Dr. Sallyanne Kuster EP: Dr. Rayann Heman  Discharge Diagnoses    Principal Problem:   Atrial flutter [facility]) Active Problems:   Super obese   Dyslipidemia   Syncope and collapse   History of Present Illness     Vicki Wheeler is a 61 y.o. female with a hx of morbid obesity, atrial flutter s/p cavotricuspid isthmus ablation 01/2017, but also atypical atrial flutter and Afib, s/p ILR, on chronic anticoagulation (Eliquis), DM, and HTN who presented to [facility] ED on 10/24/2017 for evaluation of palpitations, syncope, and dizziness.   Reported being at work when she felt dizzy and felt her heart racing. She felt palpitations and heart racing when she got to work and took 30 mg of Cardizem. She states she was under more stress this morning than usual. She continued to feel dizzy and she put her head down on her desk. She then had a witnessed syncopal event with LOC. She was helped to the floor by a nearby coworker. He states she did not hit her head. She had LOC for approximately 1-2 minutes with observed shaking in her right arm and shoulder. She was incontinent to bowel. She regained consciousness and became nauseous and vomited. A witness states she seemed confused for approximately 5 minutes after waking up. EMS arrived. Telemetry strip with questionable Afib. 12-lead on arrival and telemetry with rapid ventricular response. She states she took her normal atenolol this morning and took her "pill in the pocket" cardizem prior to the event, but felt it did not help her palpitations and racing heart beat.   Her Atenolol was increased to 100mg  BID and she was admitted for further observation with the anticipation of starting Flecainide following stress testing results.    Hospital Course    Consultants: EP  The patient's stress test was obtained and showed a mild intensity partially reversible anterior wall perfusion defect which was thought to be mostly secondary to breast attenuation. She was given a dose of Flecainide 300mg  on 12/18 but did not convert, therefore she was started on Flecainide 100mg  BID.   She continued to be in atrial flutter with HR into the 140's at times, therefore a repeat DCCV was arranged. This was performed on 10/27/2017 and she experienced conversion to NSR. Was monitored on telemetry for several hours afterwards and was maintaining NSR with HR in the 60's to 70's. She was last examined by Dr. Sallyanne Kuster and deemed stable for discharge. Follow-up with the [facility] has been arranged. A staff message was sent to the office to schedule an outpatient sleep study. She was discharged home in good condition.   _____________  Discharge Vitals Blood pressure 118/64, pulse (!) 57, temperature 98.6 F (37 C), temperature source Oral, resp. rate 11, height 5\' 5"  (1.651 m), weight (!) 342 lb 6.4 oz (155.3 kg), last menstrual period 04/09/2007, SpO2 100 %.  Filed Weights   10/26/17 0514 10/26/17 0609 10/27/17 0542  Weight: (!) 356 lb 3.2 oz (161.6 kg) (!) 345 lb 8 oz (156.7 kg) (!) 342 lb 6.4 oz (155.3 kg)    Labs & Radiologic Studies     CBC Recent Labs    10/25/17 1032  WBC 7.4  HGB 13.3  HCT 41.3  MCV 91.2  PLT 621   Basic Metabolic Panel Recent Labs  10/25/17 1032  NA 139  K 3.6  CL 103  CO2 27  GLUCOSE 114*  BUN 9  CREATININE 0.74  CALCIUM 8.8*   Liver Function Tests No results for input(s): AST, ALT, ALKPHOS, BILITOT, PROT, ALBUMIN in the last 72 hours. No results for input(s): LIPASE, AMYLASE in the last 72 hours. Cardiac Enzymes Recent Labs    10/24/17 2040 10/25/17 0229 10/25/17 1032  TROPONINI 0.05* 0.03* <0.03   BNP Invalid input(s): POCBNP D-Dimer No results for input(s): DDIMER in the last 72  hours. Hemoglobin A1C Recent Labs    10/26/17 0442  HGBA1C 6.7*   Fasting Lipid Panel Recent Labs    10/25/17 1032  CHOL 170  HDL 71  LDLCALC 87  TRIG 59  CHOLHDL 2.4   Thyroid Function Tests Recent Labs    10/25/17 0229  TSH 1.784    Dg Chest 2 View  Result Date: 10/24/2017 CLINICAL DATA:  Syncope EXAM: CHEST  2 VIEW COMPARISON:  01/18/2017 chest radiograph. FINDINGS: Loop recorder overlies the left heart. Stable cardiomediastinal silhouette with mild cardiomegaly. No pneumothorax. No pleural effusion. No overt pulmonary edema. No acute consolidative airspace disease. IMPRESSION: Stable mild cardiomegaly without overt pulmonary edema. Electronically Signed   By: Ilona Sorrel M.D.   On: 10/24/2017 13:03   Nm Myocar Multi W/spect W/wall Motion / Ef  Result Date: 10/26/2017  There was no ST segment deviation noted during stress.  Defect 1: There is a medium defect of mild severity.  This is an intermediate risk study.  Nuclear stress EF: 49%.  No T wave inversion was noted during stress.  Medium size, mild intensity partially reversible anterior wall perfusion defect. Breast attenuation is noted in the stress images, suggesting shifting breast artifact or less likely ischemia. LVEF 49% with anterior hypokinesis. This is an intermediate risk study. Clinical correlation is advised.   Diagnostic Studies/Procedures    Echocardiogram: 10/26/2017 Study Conclusions  - Left ventricle: The cavity size was normal. Systolic function was   normal. The estimated ejection fraction was in the range of 60%   to 65%. Wall motion was normal; there were no regional wall   motion abnormalities. The study is not technically sufficient to   allow evaluation of LV diastolic function. - Aortic valve: Valve area (VTI): 2.55 cm^2. Valve area (Vmax):   2.48 cm^2. Valve area (Vmean): 2.63 cm^2. - Mitral valve: There was mild regurgitation. Valve area by   continuity equation (using LVOT flow):  2.3 cm^2. - Left atrium: The atrium was moderately dilated. - Atrial septum: A patent foramen ovale cannot be excluded.  Disposition   Pt is being discharged home today in good condition.  Follow-up Plans & Appointments    Follow-up Information    [facility] Follow up on 11/04/2017.   Specialty:  Cardiology Why:  9:30AM  Contact information: [address] [id number] [place] [id number]         Discharge Instructions    Increase activity slowly   Complete by:  As directed       Discharge Medications     Medication List    TAKE these medications   acetaminophen 500 MG tablet Commonly known as:  TYLENOL Take 1,000 mg by mouth every 6 (six) hours as needed.   apixaban 5 MG Tabs tablet Commonly known as:  ELIQUIS Take 1 tablet (5 mg total) by mouth 2 (two) times daily.   atenolol 100 MG tablet Commonly known as:  TENORMIN Take 1 tablet (100 mg total) by mouth 2 (two) times daily. What changed:  when to take this   atorvastatin 10 MG tablet Commonly known as:  LIPITOR TAKE 1 TABLET BY MOUTH ONCE A DAY What changed:    how much to take  how to take this  when to take this   cholecalciferol 1000 units tablet Commonly known as:  VITAMIN D Take 1 tablet (1,000 Units total) by mouth daily.   CORICIDIN D COLD/FLU/SINUS PO Take 1 tablet by mouth daily as needed.   diltiazem 30 MG tablet Commonly known as:  CARDIZEM Take 1 tablet by mouth every 4 hours AS NEEDED for AFIB heart rate over 100   DRY EYES OP Place 1 drop into both eyes daily as needed.   flecainide 100 MG tablet Commonly known as:  TAMBOCOR Take 1 tablet (100 mg total) by mouth every 12 (twelve) hours.   lisinopril-hydrochlorothiazide 20-25 MG tablet Commonly known as:  PRINZIDE,ZESTORETIC Take 1 tablet by mouth daily.   metFORMIN 850 MG tablet Commonly known as:  GLUCOPHAGE Take 1 tablet (850 mg total) by mouth daily  with breakfast.   vitamin B-12 1000 MCG tablet Commonly known as:  CYANOCOBALAMIN Take 1 tablet (1,000 mcg total) by mouth daily.   vitamin C with rose hips 1000 MG tablet Take 1,000 mg by mouth daily.        Allergies Allergies  Allergen Reactions  . Shellfish Allergy     blisters     Outstanding Labs/Studies   BMET at follow-up; Outpatient Sleep Study  Duration of Discharge Encounter   Greater than 30 minutes including physician time.  Signed, Erma Heritage, PA-C 10/27/2017, 7:10 PM

## 2017-10-27 NOTE — Interval H&P Note (Signed)
History and Physical Interval Note:  10/27/2017 11:18 AM  Vicki Wheeler  has presented today for surgery, with the diagnosis of Atrial Flutter  The various methods of treatment have been discussed with the patient and family. After consideration of risks, benefits and other options for treatment, the patient has consented to  Procedure(s): CARDIOVERSION (N/A) as a surgical intervention .  The patient's history has been reviewed, patient examined, no change in status, stable for surgery.  I have reviewed the patient's chart and labs.  Questions were answered to the patient's satisfaction.     Jenkins Rouge

## 2017-10-27 NOTE — Telephone Encounter (Signed)
-----   Message from Erma Heritage, [place] sent at 10/27/2017 12:51 PM EST ----- Regarding: Outpatient Sleep Study Hi Armanie Ullmer,   This patient needs an outpatient sleep study for OSA and Paroxysmal Atrial Flutter per Dr. Sallyanne Kuster.   Thanks,  Erma Heritage, PA-C 10/27/2017, 12:53 PM

## 2017-10-27 NOTE — CV Procedure (Signed)
DCC: On Eliquis one missed dose last weekend. Discussed with Dr Rayann Heman and Dr Sallyanne Kuster Who were ok with proceding Anesthesia:  Dr Glennon Mac  [facility] x 1 converted from atrial flutter rate 110 to NSR rate 58 No immediate neur logic sequelae   Jenkins Rouge

## 2017-10-27 NOTE — Progress Notes (Signed)
Pt has orders to be discharged. Discharge instructions given and pt has no additional questions at this time. Medication regimen reviewed and pt educated. Pt verbalized understanding and has no additional questions. Telemetry box removed. IV removed and site in good condition. Pt stable and waiting for transportation.  Azarius Lambson RN 

## 2017-10-27 NOTE — Anesthesia Postprocedure Evaluation (Signed)
Anesthesia Post Note  Patient: Vicki Wheeler  Procedure(s) Performed: CARDIOVERSION (N/A )     Patient location during evaluation: Endoscopy Anesthesia Type: General Level of consciousness: awake and alert, oriented and patient cooperative Pain management: pain level controlled Vital Signs Assessment: post-procedure vital signs reviewed and stable Respiratory status: spontaneous breathing, respiratory function stable and nonlabored ventilation Cardiovascular status: blood pressure returned to baseline and stable Postop Assessment: no apparent nausea or vomiting Anesthetic complications: no    Last Vitals:  Vitals:   10/27/17 1216 10/27/17 1217  BP:    Pulse: (!) 55 (!) 53  Resp: 18 18  Temp:    SpO2: 100% 100%    Last Pain:  Vitals:   10/27/17 1122  TempSrc: Oral  PainSc:                  Latravion Graves,E. Rayanna Matusik

## 2017-10-27 NOTE — Anesthesia Preprocedure Evaluation (Addendum)
Anesthesia Evaluation  Patient identified by MRN, date of birth, ID band Patient awake    Reviewed: Allergy & Precautions, NPO status , Patient's Chart, lab work & pertinent test results, reviewed documented beta blocker date and time   History of Anesthesia Complications Negative for: history of anesthetic complications  Airway Mallampati: II  TM Distance: >3 FB Neck ROM: Full    Dental  (+) Dental Advisory Given   Pulmonary neg pulmonary ROS,    breath sounds clear to auscultation       Cardiovascular hypertension, Pt. on medications and Pt. on home beta blockers (-) angina+ dysrhythmias Atrial Fibrillation  Rhythm:Regular Rate:Normal  10/26/17 ECHO: EF 60-65%, valves OK   Neuro/Psych  Headaches, Anxiety Depression    GI/Hepatic Neg liver ROS, GERD  Controlled,  Endo/Other  diabetes (glu 109), Oral Hypoglycemic AgentsMorbid obesity  Renal/GU negative Renal ROS     Musculoskeletal   Abdominal (+) + obese,   Peds  Hematology eliquis   Anesthesia Other Findings   Reproductive/Obstetrics                            Anesthesia Physical Anesthesia Plan  ASA: III  Anesthesia Plan: General   Post-op Pain Management:    Induction: Intravenous  PONV Risk Score and Plan: Treatment may vary due to age or medical condition  Airway Management Planned: Natural Airway and Mask  Additional Equipment:   Intra-op Plan:   Post-operative Plan:   Informed Consent: I have reviewed the patients History and Physical, chart, labs and discussed the procedure including the risks, benefits and alternatives for the proposed anesthesia with the patient or authorized representative who has indicated his/her understanding and acceptance.   Dental advisory given  Plan Discussed with: CRNA and Surgeon  Anesthesia Plan Comments: (Plan routine monitors, GA for cardioversion)        Anesthesia Quick  Evaluation

## 2017-10-27 NOTE — Progress Notes (Signed)
Progress Note  Patient Name: Vicki Wheeler Date of Encounter: 10/27/2017  Primary Cardiologist: Sanda Klein, MD   Subjective   Seen after cardioversion.  Sinus bradycardia 55 bpm.  She states that she already feels better.  Inpatient Medications    Scheduled MeRs: . apixaban  5 mg Oral BID  . atenolol  100 mg Oral BID  . atorvastatin  10 mg Oral Daily  . flecainide  100 mg Oral Q12H  . hydrochlorothiazide  25 mg Oral Daily  . insulin aspart  0-15 Units Subcutaneous TID WC  . lisinopril  20 mg Oral Daily  . sodium chloride flush  3 mL Intravenous Q12H   Continuous Infusions: . sodium chloride     PRN Meds: sodium chloride, acetaminophen, ondansetron (ZOFRAN) IV, sodium chloride flush, technetium tetrofosmin, technetium tetrofosmin, technetium tetrofosmin   Vital Signs    Vitals:   10/27/17 1216 10/27/17 1217 10/27/17 1225 10/27/17 1230  BP:   (!) 107/53 124/62  Pulse: (!) 55 (!) 53 (!) 53 (!) 53  Resp: 18 18 13 11   Temp:   98.6 F (37 C)   TempSrc:   Oral   SpO2: 100% 100% 100% 100%  Weight:      Height:        Intake/Output Summary (Last 24 hours) at 10/27/2017 1243 Last data filed at 10/27/2017 1217 Gross per 24 hour  Intake 340 ml  Output 1700 ml  Net -1360 ml   Filed Weights   10/26/17 0514 10/26/17 0609 10/27/17 0542  Weight: (!) 356 lb 3.2 oz (161.6 kg) (!) 345 lb 8 oz (156.7 kg) (!) 342 lb 6.4 oz (155.3 kg)    Telemetry    Atrial flutter with controlled rate most of the time, but with occasional spikes to the 140 range; now sinus bradycardia 55 bpm- Personally Reviewed  ECG    Sinus bradycardia, left atrial abnormality with appearance of intra-atrial conduction delay, otherwise normal ECG; narrow QRS 90 ms, normal QTc, 441 ms- Personally Reviewed  Physical Exam  Obese, appears comfortable GEN: No acute distress.   Neck: No JVD Cardiac: RRR, no murmurs, rubs, or gallops.  Respiratory: Clear to auscultation bilaterally. GI: Soft,  nontender, non-distended  MS: No edema; No deformity. Neuro:  Nonfocal  Psych: Normal affect   Labs    Chemistry Recent Labs  Lab 10/24/17 1200 10/25/17 1032  NA 139 139  K 4.4 3.6  CL 101 103  CO2 27 27  GLUCOSE 127* 114*  BUN 11 9  CREATININE 0.85 0.74  CALCIUM 9.1 8.8*  GFRNONAA >60 >60  GFRAA >60 >60  ANIONGAP 11 9     Hematology Recent Labs  Lab 10/24/17 1200 10/25/17 1032  WBC 9.3 7.4  RBC 4.95 4.53  HGB 14.5 13.3  HCT 45.5 41.3  MCV 91.9 91.2  MCH 29.3 29.4  MCHC 31.9 32.2  RDW 13.7 13.7  PLT 288 264    Cardiac Enzymes Recent Labs  Lab 10/24/17 2040 10/25/17 0229 10/25/17 1032  TROPONINI 0.05* 0.03* <0.03    Recent Labs  Lab 10/24/17 1210 10/24/17 1504  TROPIPOC 0.10* 0.19*     BNPNo results for input(s): BNP, PROBNP in the last 168 hours.   DDimer No results for input(s): DDIMER in the last 168 hours.   Radiology    Nm Myocar Multi W/spect W/wall Motion / Ef  Result Date: 10/26/2017  There was no ST segment deviation noted during stress.  Defect 1: There is a medium defect of mild  severity.  This is an intermediate risk study.  Nuclear stress EF: 49%.  No T wave inversion was noted during stress.  Medium size, mild intensity partially reversible anterior wall perfusion defect. Breast attenuation is noted in the stress images, suggesting shifting breast artifact or less likely ischemia. LVEF 49% with anterior hypokinesis. This is an intermediate risk study. Clinical correlation is advised.    Cardiac Studies   Echo 10/26/2017 - Left ventricle: The cavity size was normal. Systolic function was   normal. The estimated ejection fraction was in the range of 60%   to 65%. Wall motion was normal; there were no regional wall   motion abnormalities. The study is not technically sufficient to   allow evaluation of LV diastolic function. - Aortic valve: Valve area (VTI): 2.55 cm^2. Valve area (Vmax):   2.48 cm^2. Valve area (Vmean): 2.63  cm^2. - Mitral valve: There was mild regurgitation. Valve area by   continuity equation (using LVOT flow): 2.3 cm^2. - Left atrium: The atrium was moderately dilated. - Atrial septum: A patent foramen ovale cannot be excluded.  10/27/2017 [place] x 1 converted from atrial flutter rate 110 to NSR rate 58 No immediate neurologic sequelae   Patient Profile     61 y.o. female with syncope due to atypical atrial flutter with 1:1 AV conduction, history of previous cavotricuspid isthmus ablation, morbid obesity, DM, HTN, loop recorder in place.  Assessment & Plan    1.Aflutter -Tolerating flecainide without side effects so far, narrow QRS.  We will not work as a "pill in pocket" approach, but may still provide benefit as arrhythmia prevention.  Now on higher dose atenolol for rate control.  Unfortunately, remains at risk for 1: 1 AV conduction and extreme tachycardia. - Continues to have preserved left ventricular systolic function, she has a tiny perfusion abnormality on her nuclear study which I am fairly confident represents shifting breast attenuation artifact, not true ischemia. Encouraged continued-attempts at weight loss.  Physical exercise is encouraged as well, will use for loop recorder to monitor for episodes of extreme tachycardia. 2. HTN - Acceptable BP on current medical regimen  3. HLD -onstatin.  4. DM - controlled  For questions or updates, please contact [place] Please consult [email or web address] for contact info under Cardiology/STEMI.      Signed, Sanda Klein, MD  10/27/2017, 12:43 PM

## 2017-10-27 NOTE — Progress Notes (Addendum)
Progress Note   Subjective   Doing well today, the patient denies CP or SOB.  No new concerns  Inpatient Medications    Scheduled Meds: . apixaban  5 mg Oral BID  . atenolol  100 mg Oral BID  . atorvastatin  10 mg Oral Daily  . flecainide  100 mg Oral Q12H  . hydrochlorothiazide  25 mg Oral Daily  . insulin aspart  0-15 Units Subcutaneous TID WC  . lisinopril  20 mg Oral Daily  . sodium chloride flush  3 mL Intravenous Q12H   Continuous Infusions: . sodium chloride     PRN Meds: sodium chloride, acetaminophen, ondansetron (ZOFRAN) IV, sodium chloride flush, technetium tetrofosmin, technetium tetrofosmin, technetium tetrofosmin   Vital Signs    Vitals:   10/26/17 1203 10/26/17 1936 10/26/17 2216 10/27/17 0542  BP: (!) 150/123 (!) 142/80 132/80   Pulse: 86 80 78   Resp: 20     Temp: 98.2 F (36.8 C) 98.2 F (36.8 C)    TempSrc: Oral Oral    SpO2: 99% 98%    Weight:    (!) 342 lb 6.4 oz (155.3 kg)  Height:        Intake/Output Summary (Last 24 hours) at 10/27/2017 0847 Last data filed at 10/27/2017 0626 Gross per 24 hour  Intake 480 ml  Output 2000 ml  Net -1520 ml   Filed Weights   10/26/17 0514 10/26/17 0609 10/27/17 0542  Weight: (!) 356 lb 3.2 oz (161.6 kg) (!) 345 lb 8 oz (156.7 kg) (!) 342 lb 6.4 oz (155.3 kg)    Telemetry    Atypical atrial flutter - Personally Reviewed  Physical Exam   GEN- The patient is morbidly obese appearing, alert and oriented x 3 today.   Head- normocephalic, atraumatic Eyes-  Sclera clear, conjunctiva pink Ears- hearing intact Oropharynx- clear Neck- supple, Lungs- CTA b/l, normal work of breathing Heart- irregular rate and rhythm  GI- soft, NT, ND, obese Extremities- no clubbing, cyanosis, or edema  MS- no significant deformity or atrophy Skin- no rash or lesion Psych- euthymic mood, full affect Neuro- strength and sensation are intact   Labs    Chemistry Recent Labs  Lab 10/24/17 1200 10/25/17 1032    NA 139 139  K 4.4 3.6  CL 101 103  CO2 27 27  GLUCOSE 127* 114*  BUN 11 9  CREATININE 0.85 0.74  CALCIUM 9.1 8.8*  GFRNONAA >60 >60  GFRAA >60 >60  ANIONGAP 11 9     Hematology Recent Labs  Lab 10/24/17 1200 10/25/17 1032  WBC 9.3 7.4  RBC 4.95 4.53  HGB 14.5 13.3  HCT 45.5 41.3  MCV 91.9 91.2  MCH 29.3 29.4  MCHC 31.9 32.2  RDW 13.7 13.7  PLT 288 264    Cardiac Enzymes Recent Labs  Lab 10/24/17 2040 10/25/17 0229 10/25/17 1032  TROPONINI 0.05* 0.03* <0.03    Recent Labs  Lab 10/24/17 1210 10/24/17 1504  TROPIPOC 0.10* 0.19*     10/26/17: TTE Study Conclusions - Left ventricle: The cavity size was normal. Systolic function was   normal. The estimated ejection fraction was in the range of 60%   to 65%. Wall motion was normal; there were no regional wall   motion abnormalities. The study is not technically sufficient to   allow evaluation of LV diastolic function. - Aortic valve: Valve area (VTI): 2.55 cm^2. Valve area (Vmax):   2.48 cm^2. Valve area (Vmean): 2.63 cm^2. - Mitral valve:  There was mild regurgitation. Valve area by   continuity equation (using LVOT flow): 2.3 cm^2. - Left atrium: The atrium was moderately dilated. (36mm) - Atrial septum: A patent foramen ovale cannot be excluded.  Stress myoview, 12/18-19/18  There was no ST segment deviation noted during stress.  Defect 1: There is a medium defect of mild severity.  This is an intermediate risk study.  Nuclear stress EF: 49%.  No T wave inversion was noted during stress. Medium size, mild intensity partially reversible anterior wall perfusion defect. Breast attenuation is noted in the stress images, suggesting shifting breast artifact or less likely ischemia. LVEF 49% with anterior hypokinesis. This is an intermediate risk study. Clinical correlation is advised.     Assessment & Plan    1.  Atypical atrial flutter Did not convert with oral flecainide Discussed myoview with Dr.  Sallyanne Kuster, he reviewed images and felt to all be 2/2 attenuation artifact Started on flecainide 100mg  BID Planned  for cardioversion today CHA2DS2Vasc is 3, on Eliquis The patient mentions she thinks she may have forgotten an evening dose of her Eliquis last weekend falling asleep early, this is the only time.  I have discussed with Dr. Sallyanne Kuster and Dr. Rayann Heman, they both feel it is OK to proceed with DCCV this afternoon   2. Obesity Body mass index is 56.98 kg/m. Lifestyle modification has been encouraged, she is receptive She would benefit from bariatric referral at discharge  Tommye Standard, PA-C 10/27/2017  I have seen, examined the patient, and reviewed the above assessment and plan.  Changes to above are made where necessary.  On exam, RRR.  Doing well s/p cardioversion.  OK to discharge to home.  Follow-up in AF clinic in 1 week.  Co Sign: Thompson Grayer, MD

## 2017-10-27 NOTE — Transfer of Care (Signed)
Immediate Anesthesia Transfer of Care Note  Patient: Vicki Wheeler  Procedure(s) Performed: CARDIOVERSION (N/A )  Patient Location: Endoscopy Unit  Anesthesia Type:General  Level of Consciousness: awake, alert  and oriented  Airway & Oxygen Therapy: Patient Spontanous Breathing  Post-op Assessment: Report given to RN and Post -op Vital signs reviewed and stable  Post vital signs: Reviewed and stable  Last Vitals:  Vitals:   10/27/17 1216 10/27/17 1217  BP:    Pulse: (!) 55 (!) 53  Resp: 18 18  Temp:    SpO2: 100% 100%    Last Pain:  Vitals:   10/27/17 1122  TempSrc: Oral  PainSc:          Complications: No apparent anesthesia complications

## 2017-10-31 ENCOUNTER — Ambulatory Visit (INDEPENDENT_AMBULATORY_CARE_PROVIDER_SITE_OTHER): Payer: BC Managed Care – PPO | Admitting: *Deleted

## 2017-10-31 DIAGNOSIS — I480 Paroxysmal atrial fibrillation: Secondary | ICD-10-CM

## 2017-11-02 NOTE — Progress Notes (Signed)
Carelink Summary Report / Loop Recorder 

## 2017-11-04 ENCOUNTER — Encounter (HOSPITAL_COMMUNITY): Payer: Self-pay | Admitting: Nurse Practitioner

## 2017-11-04 ENCOUNTER — Ambulatory Visit (HOSPITAL_COMMUNITY)
Admit: 2017-11-04 | Discharge: 2017-11-04 | Disposition: A | Discharge status: Home / Self Care | Payer: BC Managed Care – PPO | Source: Ambulatory Visit | Attending: Nurse Practitioner | Admitting: Nurse Practitioner

## 2017-11-04 VITALS — BP 132/76 | HR 49 | Ht 65.0 in | Wt 349.2 lb

## 2017-11-04 DIAGNOSIS — Z823 Family history of stroke: Secondary | ICD-10-CM | POA: Insufficient documentation

## 2017-11-04 DIAGNOSIS — E119 Type 2 diabetes mellitus without complications: Secondary | ICD-10-CM | POA: Insufficient documentation

## 2017-11-04 DIAGNOSIS — I10 Essential (primary) hypertension: Secondary | ICD-10-CM | POA: Insufficient documentation

## 2017-11-04 DIAGNOSIS — Z91013 Allergy to seafood: Secondary | ICD-10-CM | POA: Insufficient documentation

## 2017-11-04 DIAGNOSIS — Z6841 Body Mass Index (BMI) 40.0 and over, adult: Secondary | ICD-10-CM | POA: Insufficient documentation

## 2017-11-04 DIAGNOSIS — R55 Syncope and collapse: Secondary | ICD-10-CM | POA: Insufficient documentation

## 2017-11-04 DIAGNOSIS — I483 Typical atrial flutter: Secondary | ICD-10-CM | POA: Insufficient documentation

## 2017-11-04 DIAGNOSIS — Z825 Family history of asthma and other chronic lower respiratory diseases: Secondary | ICD-10-CM | POA: Diagnosis not present

## 2017-11-04 DIAGNOSIS — Z79899 Other long term (current) drug therapy: Secondary | ICD-10-CM | POA: Insufficient documentation

## 2017-11-04 DIAGNOSIS — Z833 Family history of diabetes mellitus: Secondary | ICD-10-CM | POA: Insufficient documentation

## 2017-11-04 DIAGNOSIS — Z8249 Family history of ischemic heart disease and other diseases of the circulatory system: Secondary | ICD-10-CM | POA: Insufficient documentation

## 2017-11-04 DIAGNOSIS — Z8042 Family history of malignant neoplasm of prostate: Secondary | ICD-10-CM | POA: Insufficient documentation

## 2017-11-04 DIAGNOSIS — Z7901 Long term (current) use of anticoagulants: Secondary | ICD-10-CM | POA: Diagnosis not present

## 2017-11-04 DIAGNOSIS — I4892 Unspecified atrial flutter: Secondary | ICD-10-CM | POA: Diagnosis not present

## 2017-11-04 DIAGNOSIS — Z7984 Long term (current) use of oral hypoglycemic drugs: Secondary | ICD-10-CM | POA: Diagnosis not present

## 2017-11-04 DIAGNOSIS — I4891 Unspecified atrial fibrillation: Secondary | ICD-10-CM | POA: Diagnosis present

## 2017-11-04 LAB — BASIC METABOLIC PANEL
Anion gap: 9 (ref 5–15)
BUN: 10 mg/dL (ref 6–20)
CHLORIDE: 99 mmol/L — AB (ref 101–111)
CO2: 31 mmol/L (ref 22–32)
CREATININE: 0.9 mg/dL (ref 0.44–1.00)
Calcium: 9 mg/dL (ref 8.9–10.3)
GFR calc Af Amer: >60 mL/min (ref >60)
GLUCOSE: 85 mg/dL (ref 65–99)
Potassium: 4.2 mmol/L (ref 3.5–5.1)
SODIUM: 139 mmol/L (ref 135–145)
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
## 2017-11-04 NOTE — Telephone Encounter (Signed)
Informed patient of upcoming sleep study and patient understanding was verbalized. Patient understands her sleep study is scheduled for Wednesday November 30 2017. Patient understands her sleep study will be done at [facility] sleep lab. Patient understands she will receive a sleep packet in a week or so. Patient understands to call if she does not receive the sleep packet in a timely manner. Patient agrees with treatment and thanked me for call.

## 2017-11-06 NOTE — Progress Notes (Signed)
Thanks, Butch Penny. [place], can we schedule plain ETT in 3 weeks, please? MCr

## 2017-11-11 LAB — CUP PACEART REMOTE DEVICE CHECK
Date Time Interrogation Session: 20181223173849
MDC IDC PG IMPLANT DT: 20180523

## 2017-11-15 ENCOUNTER — Ambulatory Visit: Payer: BC Managed Care – PPO | Admitting: Cardiovascular Disease

## 2017-11-15 ENCOUNTER — Encounter: Payer: Self-pay | Admitting: Cardiovascular Disease

## 2017-11-15 VITALS — BP 146/80 | HR 61 | Ht 65.0 in | Wt 352.0 lb

## 2017-11-15 DIAGNOSIS — Z5181 Encounter for therapeutic drug level monitoring: Secondary | ICD-10-CM

## 2017-11-15 DIAGNOSIS — E669 Obesity, unspecified: Secondary | ICD-10-CM | POA: Diagnosis not present

## 2017-11-15 DIAGNOSIS — Z7901 Long term (current) use of anticoagulants: Secondary | ICD-10-CM | POA: Diagnosis not present

## 2017-11-15 DIAGNOSIS — I10 Essential (primary) hypertension: Secondary | ICD-10-CM | POA: Diagnosis not present

## 2017-11-15 DIAGNOSIS — Z4509 Encounter for adjustment and management of other cardiac device: Secondary | ICD-10-CM | POA: Insufficient documentation

## 2017-11-15 DIAGNOSIS — I480 Paroxysmal atrial fibrillation: Secondary | ICD-10-CM

## 2017-11-15 DIAGNOSIS — E1169 Type 2 diabetes mellitus with other specified complication: Secondary | ICD-10-CM

## 2017-11-15 DIAGNOSIS — Z79899 Other long term (current) drug therapy: Secondary | ICD-10-CM
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
## 2017-11-15 NOTE — Patient Instructions (Signed)
Dr Sallyanne Kuster recommends that you continue on your current medications as directed. Please refer to the Current Medication list given to you today.  Your physician recommends that you schedule a follow-up appointment in 3 months with Dr Rayann Heman.  Dr Sallyanne Kuster recommends that you schedule a follow-up appointment in 6 months. You will receive a reminder letter in the mail two months in advance. If you don't receive a letter, please call our office to schedule the follow-up appointment.  If you need a refill on your cardiac medications before your next appointment, please call your pharmacy.

## 2017-11-29 ENCOUNTER — Ambulatory Visit (INDEPENDENT_AMBULATORY_CARE_PROVIDER_SITE_OTHER): Payer: BC Managed Care – PPO | Admitting: *Deleted

## 2017-11-29 ENCOUNTER — Telehealth: Payer: Self-pay | Admitting: Cardiology

## 2017-11-29 DIAGNOSIS — I480 Paroxysmal atrial fibrillation: Secondary | ICD-10-CM | POA: Diagnosis not present

## 2017-11-29 NOTE — Telephone Encounter (Signed)
Error called in error

## 2017-11-30 ENCOUNTER — Ambulatory Visit (HOSPITAL_BASED_OUTPATIENT_CLINIC_OR_DEPARTMENT_OTHER): Payer: BC Managed Care – PPO | Attending: Cardiology | Admitting: Cardiology

## 2017-11-30 ENCOUNTER — Ambulatory Visit: Payer: BC Managed Care – PPO | Admitting: Cardiology

## 2017-11-30 VITALS — Ht 65.0 in | Wt 350.0 lb

## 2017-11-30 DIAGNOSIS — I484 Atypical atrial flutter: Secondary | ICD-10-CM

## 2017-11-30 DIAGNOSIS — R0683 Snoring: Secondary | ICD-10-CM | POA: Diagnosis not present

## 2017-11-30 DIAGNOSIS — G4733 Obstructive sleep apnea (adult) (pediatric): Secondary | ICD-10-CM | POA: Diagnosis not present

## 2017-11-30 NOTE — Progress Notes (Signed)
Carelink Summary Report / Loop Recorder 

## 2017-12-03 NOTE — Procedures (Signed)
   NAME: Vicki Wheeler DATE OF BIRTH:  July 12, 1956 MEDICAL RECORD NUMBER [id number]  LOCATION: [facility]  PHYSICIAN: Alin Chavira  DATE OF STUDY: 11/30/2017  SLEEP STUDY TYPE: Nocturnal Polysomnogram  CLINICAL INFORMATION Sleep Study Type: NPSG  Indication for sleep study: OSA  Epworth Sleepiness Score: 14  SLEEP STUDY TECHNIQUE As per the AASM Manual for the Scoring of Sleep and Associated Events v2.3 (April 2016) with a hypopnea requiring 4% desaturations.  The channels recorded and monitored were frontal, central and occipital EEG, electrooculogram (EOG), submentalis EMG (chin), nasal and oral airflow, thoracic and abdominal wall motion, anterior tibialis EMG, snore microphone, electrocardiogram, and pulse oximetry.  MEDICATIONS Medications self-administered by patient taken the night of the study : N/A  SLEEP ARCHITECTURE The study was initiated at 10:26:59 PM and ended at 5:11:28 AM.  Sleep onset time was 102.4 minutes and the sleep efficiency was 65.4%. The total sleep time was 264.6 minutes.  Stage REM latency was 150.0 minutes.  The patient spent 2.10% of the night in stage N1 sleep, 75.60% in stage N2 sleep, 0.00% in stage N3 and 22.30% in REM.  Alpha intrusion was absent.  Supine sleep was 100.00%.  RESPIRATORY PARAMETERS The overall apnea/hypopnea index (AHI) was 5.7 per hour. There were 6 total apneas, including 6 obstructive, 0 central and 0 mixed apneas. There were 19 hypopneas and 1 RERAs.  The AHI during Stage REM sleep was 24.4 per hour.  AHI while supine was 5.7 per hour.  The mean oxygen saturation was 94.36%. The minimum SpO2 during sleep was 85.00%.  soft snoring was noted during this study.  CARDIAC DATA The 2 lead EKG demonstrated sinus rhythm. The mean heart rate was 52.19 beats per minute. Other EKG findings include: None.  LEG MOVEMENT DATA The total PLMS were 863 with a resulting PLMS index of 195.72. Associated  arousal with leg movement index was 2.7 .  IMPRESSIONS - Mild obstructive sleep apnea occurred during this study (AHI = 5.7/h). - No significant central sleep apnea occurred during this study (CAI = 0.0/h). - Mild oxygen desaturation was noted during this study (Min O2 = 85.00%). - The patient snored with soft snoring volume. - No cardiac abnormalities were noted during this study. - Severe periodic limb movements of sleep occurred during the study. No significant associated arousals.  DIAGNOSIS - Obstructive Sleep Apnea (327.23 [G47.33 ICD-10])  RECOMMENDATIONS - Very mild obstructive sleep apnea overall with AHI 5.7/hr but moderate during REM sleep with an AHI of24/hr and excessive daytime sleepiness with an ESS of 14.  Therefore recommend CPAP titration.  - Avoid alcohol, sedatives and other CNS depressants that may worsen sleep apnea and disrupt normal sleep architecture. - Sleep hygiene should be reviewed to assess factors that may improve sleep quality. - Weight management and regular exercise should be initiated or continued if appropriate.               REFERRING PHYSICIAN: Sueanne Margarita, MD  [place], American Board of Sleep Medicine  ELECTRONICALLY SIGNED ON:  12/03/2017, 4:24 PM [place] PH: [phone]   FX: [phone] [place]

## 2017-12-09 LAB — CUP PACEART REMOTE DEVICE CHECK
Implantable Pulse Generator Implant Date: 20180523
MDC IDC SESS DTM: 20190122184043

## 2017-12-15 ENCOUNTER — Telehealth: Payer: Self-pay | Admitting: *Deleted

## 2017-12-15 NOTE — Telephone Encounter (Addendum)
Informed patient of sleep study results and patient understanding was verbalized. Patient understands she has sleep apnea and Dr Radford Pax recommends CPAP titration.  Patient wants to think about having another test before deciding to move forward with the titration. Patient states she will call our office back when she has decided to schedule the titration.

## 2018-01-02 ENCOUNTER — Ambulatory Visit (INDEPENDENT_AMBULATORY_CARE_PROVIDER_SITE_OTHER): Payer: BC Managed Care – PPO | Admitting: *Deleted

## 2018-01-02 DIAGNOSIS — I480 Paroxysmal atrial fibrillation: Secondary | ICD-10-CM | POA: Diagnosis not present

## 2018-01-02 NOTE — Progress Notes (Signed)
Carelink Summary Report / Loop Recorder 

## 2018-01-17 ENCOUNTER — Other Ambulatory Visit (HOSPITAL_COMMUNITY): Payer: Self-pay | Admitting: Nurse Practitioner

## 2018-01-18 ENCOUNTER — Encounter: Payer: Self-pay | Admitting: Internal Medicine

## 2018-02-03 ENCOUNTER — Ambulatory Visit (INDEPENDENT_AMBULATORY_CARE_PROVIDER_SITE_OTHER): Payer: BC Managed Care – PPO | Admitting: *Deleted

## 2018-02-03 DIAGNOSIS — I480 Paroxysmal atrial fibrillation: Secondary | ICD-10-CM

## 2018-02-06 NOTE — Progress Notes (Signed)
Carelink Summary Report / Loop Recorder 

## 2018-02-07 LAB — CUP PACEART REMOTE DEVICE CHECK
Date Time Interrogation Session: 20190224194021
MDC IDC PG IMPLANT DT: 20180523

## 2018-02-15 ENCOUNTER — Ambulatory Visit: Payer: BC Managed Care – PPO | Admitting: Internal Medicine

## 2018-03-08 ENCOUNTER — Ambulatory Visit (INDEPENDENT_AMBULATORY_CARE_PROVIDER_SITE_OTHER): Payer: BC Managed Care – PPO | Admitting: *Deleted

## 2018-03-08 DIAGNOSIS — I480 Paroxysmal atrial fibrillation: Secondary | ICD-10-CM

## 2018-03-09 NOTE — Progress Notes (Signed)
Carelink Summary Report / Loop Recorder 

## 2018-03-10 ENCOUNTER — Telehealth: Payer: Self-pay

## 2018-03-10 DIAGNOSIS — I483 Typical atrial flutter: Secondary | ICD-10-CM

## 2018-03-10 LAB — CUP PACEART REMOTE DEVICE CHECK
Date Time Interrogation Session: 20190329200953
MDC IDC PG IMPLANT DT: 20180523

## 2018-03-10 NOTE — Telephone Encounter (Deleted)
-----   Message from Sanda Klein, MD sent at 11/06/2017 11:00 AM EST -----   ----- Message ----- From: Sherran Needs, NP Sent: 11/04/2017   1:24 PM To: Sanda Klein, MD

## 2018-03-10 NOTE — Telephone Encounter (Signed)
GXT ordered

## 2018-03-15 ENCOUNTER — Telehealth (HOSPITAL_COMMUNITY): Payer: Self-pay

## 2018-03-15 NOTE — Telephone Encounter (Signed)
Encounter complete. 

## 2018-03-17 ENCOUNTER — Ambulatory Visit (HOSPITAL_COMMUNITY)
Admission: RE | Admit: 2018-03-17 | Discharge: 2018-03-17 | Disposition: A | Discharge status: Home / Self Care | Payer: BC Managed Care – PPO | Source: Ambulatory Visit | Attending: Cardiovascular Disease | Admitting: Cardiovascular Disease

## 2018-03-17 DIAGNOSIS — I483 Typical atrial flutter: Secondary | ICD-10-CM | POA: Diagnosis not present

## 2018-03-17 LAB — EXERCISE TOLERANCE TEST
CHL RATE OF PERCEIVED EXERTION: 19
CSEPEW: 4.6 METS
Exercise duration (min): 2 min
Exercise duration (sec): 47 s
MPHR: 161 {beats}/min
Peak HR: 122 {beats}/min
Percent HR: 75 %
Rest HR: 67 {beats}/min

## 2018-04-03 LAB — CUP PACEART REMOTE DEVICE CHECK
Date Time Interrogation Session: 20190501213930
MDC IDC PG IMPLANT DT: 20180523

## 2018-04-10 ENCOUNTER — Ambulatory Visit (INDEPENDENT_AMBULATORY_CARE_PROVIDER_SITE_OTHER): Payer: BC Managed Care – PPO | Admitting: *Deleted

## 2018-04-10 DIAGNOSIS — I480 Paroxysmal atrial fibrillation: Secondary | ICD-10-CM

## 2018-04-11 NOTE — Progress Notes (Signed)
Carelink Summary Report / Loop Recorder 

## 2018-04-19 ENCOUNTER — Telehealth: Payer: Self-pay | Admitting: Cardiology

## 2018-04-19 NOTE — Telephone Encounter (Signed)
LMOVM requesting that pt send manual transmission b/c home monitor has not updated in at least 14 days.    

## 2018-05-03 ENCOUNTER — Encounter: Payer: BC Managed Care – PPO | Admitting: Internal Medicine

## 2018-05-15 ENCOUNTER — Ambulatory Visit: Payer: BC Managed Care – PPO | Admitting: Cardiovascular Disease

## 2018-05-15 ENCOUNTER — Encounter: Payer: Self-pay | Admitting: Cardiovascular Disease

## 2018-05-15 ENCOUNTER — Ambulatory Visit (INDEPENDENT_AMBULATORY_CARE_PROVIDER_SITE_OTHER): Payer: BC Managed Care – PPO | Admitting: *Deleted

## 2018-05-15 VITALS — BP 142/80 | HR 61 | Ht 65.0 in | Wt 358.0 lb

## 2018-05-15 DIAGNOSIS — E1169 Type 2 diabetes mellitus with other specified complication: Secondary | ICD-10-CM

## 2018-05-15 DIAGNOSIS — Z79899 Other long term (current) drug therapy: Secondary | ICD-10-CM

## 2018-05-15 DIAGNOSIS — Z7901 Long term (current) use of anticoagulants: Secondary | ICD-10-CM

## 2018-05-15 DIAGNOSIS — Z5181 Encounter for therapeutic drug level monitoring: Secondary | ICD-10-CM | POA: Diagnosis not present

## 2018-05-15 DIAGNOSIS — I480 Paroxysmal atrial fibrillation: Secondary | ICD-10-CM

## 2018-05-15 DIAGNOSIS — I484 Atypical atrial flutter: Secondary | ICD-10-CM | POA: Diagnosis not present

## 2018-05-15 DIAGNOSIS — I10 Essential (primary) hypertension: Secondary | ICD-10-CM | POA: Diagnosis not present

## 2018-05-15 DIAGNOSIS — E669 Obesity, unspecified: Secondary | ICD-10-CM

## 2018-05-15 DIAGNOSIS — Z9889 Other specified postprocedural states: Secondary | ICD-10-CM

## 2018-05-15 NOTE — Patient Instructions (Addendum)
Medication Instructions: Dr Sallyanne Kuster recommends that you continue on your current medications as directed. Please refer to the Current Medication list given to you today.  Labwork: Your physician recommends that you return for lab work at your convenience.  Testing/Procedures: NONE ORDERED  Follow-up: Dr Sallyanne Kuster recommends that you schedule a follow-up appointment in 6 months. You will receive a reminder letter in the mail two months in advance. If you don't receive a letter, please call our office to schedule the follow-up appointment.  If you need a refill on your cardiac medications before your next appointment, please call your pharmacy.   You have been referred to Dr Dennard Nip at [facility]. They will call you to schedule an appointment. Their phone number is [phone] if you do not hear from them.

## 2018-05-15 NOTE — Progress Notes (Signed)
Carelink Summary Report / Loop Recorder 
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
## 2018-05-17 LAB — CUP PACEART REMOTE DEVICE CHECK
Implantable Pulse Generator Implant Date: 20180523
MDC IDC SESS DTM: 20190603234004

## 2018-06-15 ENCOUNTER — Ambulatory Visit (INDEPENDENT_AMBULATORY_CARE_PROVIDER_SITE_OTHER): Payer: BC Managed Care – PPO | Admitting: *Deleted

## 2018-06-15 DIAGNOSIS — I480 Paroxysmal atrial fibrillation: Secondary | ICD-10-CM

## 2018-06-16 NOTE — Progress Notes (Signed)
Carelink Summary Report / Loop Recorder 

## 2018-06-17 LAB — CUP PACEART REMOTE DEVICE CHECK
Date Time Interrogation Session: 20190706233738
MDC IDC PG IMPLANT DT: 20180523

## 2018-07-18 ENCOUNTER — Ambulatory Visit (INDEPENDENT_AMBULATORY_CARE_PROVIDER_SITE_OTHER): Payer: BC Managed Care – PPO | Admitting: *Deleted

## 2018-07-18 DIAGNOSIS — I480 Paroxysmal atrial fibrillation: Secondary | ICD-10-CM | POA: Diagnosis not present

## 2018-07-19 NOTE — Progress Notes (Signed)
Carelink Summary Report / Loop Recorder 

## 2018-07-21 ENCOUNTER — Other Ambulatory Visit: Payer: Self-pay | Admitting: Student

## 2018-07-21 DIAGNOSIS — I480 Paroxysmal atrial fibrillation: Secondary | ICD-10-CM

## 2018-07-25 ENCOUNTER — Encounter (INDEPENDENT_AMBULATORY_CARE_PROVIDER_SITE_OTHER): Payer: No Typology Code available for payment source

## 2018-07-26 LAB — CUP PACEART REMOTE DEVICE CHECK
Date Time Interrogation Session: 20190809001001
Implantable Pulse Generator Implant Date: 20180523

## 2018-08-04 LAB — CUP PACEART REMOTE DEVICE CHECK
Date Time Interrogation Session: 20190911003649
Implantable Pulse Generator Implant Date: 20180523

## 2018-08-08 ENCOUNTER — Ambulatory Visit (INDEPENDENT_AMBULATORY_CARE_PROVIDER_SITE_OTHER): Payer: BC Managed Care – PPO | Admitting: Bariatrics

## 2018-08-08 ENCOUNTER — Encounter (INDEPENDENT_AMBULATORY_CARE_PROVIDER_SITE_OTHER): Payer: Self-pay | Admitting: Bariatrics

## 2018-08-08 VITALS — BP 148/68 | HR 55 | Temp 97.7°F | Ht 64.0 in | Wt 351.0 lb

## 2018-08-08 DIAGNOSIS — Z1331 Encounter for screening for depression: Secondary | ICD-10-CM

## 2018-08-08 DIAGNOSIS — R0602 Shortness of breath: Secondary | ICD-10-CM

## 2018-08-08 DIAGNOSIS — E7849 Other hyperlipidemia: Secondary | ICD-10-CM

## 2018-08-08 DIAGNOSIS — I10 Essential (primary) hypertension: Secondary | ICD-10-CM

## 2018-08-08 DIAGNOSIS — Z6841 Body Mass Index (BMI) 40.0 and over, adult: Secondary | ICD-10-CM

## 2018-08-08 DIAGNOSIS — Z9189 Other specified personal risk factors, not elsewhere classified: Secondary | ICD-10-CM

## 2018-08-08 DIAGNOSIS — Z0289 Encounter for other administrative examinations: Secondary | ICD-10-CM

## 2018-08-08 DIAGNOSIS — R5383 Other fatigue: Secondary | ICD-10-CM | POA: Diagnosis not present

## 2018-08-08 DIAGNOSIS — R7303 Prediabetes: Secondary | ICD-10-CM

## 2018-08-08 DIAGNOSIS — Z8679 Personal history of other diseases of the circulatory system: Secondary | ICD-10-CM
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
## 2018-08-10 LAB — COMPREHENSIVE METABOLIC PANEL
ALBUMIN: 4.2 g/dL (ref 3.6–4.8)
ALT: 13 IU/L (ref 0–32)
AST: 15 IU/L (ref 0–40)
Albumin/Globulin Ratio: 1.4 (ref 1.2–2.2)
Alkaline Phosphatase: 82 IU/L (ref 39–117)
BUN / CREAT RATIO: 14 (ref 12–28)
BUN: 12 mg/dL (ref 8–27)
Bilirubin Total: 0.3 mg/dL (ref 0.0–1.2)
CO2: 26 mmol/L (ref 20–29)
CREATININE: 0.83 mg/dL (ref 0.57–1.00)
Calcium: 9.7 mg/dL (ref 8.7–10.3)
Chloride: 96 mmol/L (ref 96–106)
GFR calc Af Amer: 89 mL/min/{1.73_m2} (ref >59)
GFR calc non Af Amer: 77 mL/min/{1.73_m2} (ref >59)
GLOBULIN, TOTAL: 3 g/dL (ref 1.5–4.5)
Glucose: 97 mg/dL (ref 65–99)
Potassium: 4.8 mmol/L (ref 3.5–5.2)
SODIUM: 140 mmol/L (ref 134–144)
Total Protein: 7.2 g/dL (ref 6.0–8.5)

## 2018-08-10 LAB — HEMOGLOBIN A1C
Est. average glucose Bld gHb Est-mCnc: 140 mg/dL
Hgb A1c MFr Bld: 6.5 % — ABNORMAL HIGH (ref 4.8–5.6)

## 2018-08-10 LAB — T4, FREE: Free T4: 1.25 ng/dL (ref 0.82–1.77)

## 2018-08-10 LAB — LIPID PANEL WITH LDL/HDL RATIO
Cholesterol, Total: 198 mg/dL (ref 100–199)
HDL: 91 mg/dL (ref >39)
LDL CALC: 96 mg/dL (ref 0–99)
LDL/HDL RATIO: 1.1 ratio (ref 0.0–3.2)
TRIGLYCERIDES: 54 mg/dL (ref 0–149)
VLDL CHOLESTEROL CAL: 11 mg/dL (ref 5–40)

## 2018-08-10 LAB — INSULIN, RANDOM: INSULIN: 16.4 u[IU]/mL (ref 2.6–24.9)

## 2018-08-10 LAB — VITAMIN B12: Vitamin B-12: 828 pg/mL (ref 232–1245)

## 2018-08-10 LAB — TSH: TSH: 1.37 u[IU]/mL (ref 0.450–4.500)

## 2018-08-10 LAB — FOLATE: Folate: 10 ng/mL (ref >3.0)

## 2018-08-10 LAB — VITAMIN D 25 HYDROXY (VIT D DEFICIENCY, FRACTURES): Vit D, 25-Hydroxy: 26.9 ng/mL — ABNORMAL LOW (ref 30.0–100.0)

## 2018-08-10 LAB — T3: T3, Total: 128 ng/dL (ref 71–180)

## 2018-08-16 ENCOUNTER — Ambulatory Visit (INDEPENDENT_AMBULATORY_CARE_PROVIDER_SITE_OTHER): Payer: BC Managed Care – PPO | Admitting: Bariatrics

## 2018-08-16 VITALS — BP 115/71 | HR 55 | Temp 97.8°F | Ht 64.0 in | Wt 343.0 lb

## 2018-08-16 DIAGNOSIS — E119 Type 2 diabetes mellitus without complications: Secondary | ICD-10-CM

## 2018-08-16 DIAGNOSIS — Z9189 Other specified personal risk factors, not elsewhere classified: Secondary | ICD-10-CM | POA: Diagnosis not present

## 2018-08-16 DIAGNOSIS — E559 Vitamin D deficiency, unspecified: Secondary | ICD-10-CM | POA: Diagnosis not present

## 2018-08-16 DIAGNOSIS — I10 Essential (primary) hypertension: Secondary | ICD-10-CM

## 2018-08-16 DIAGNOSIS — Z6841 Body Mass Index (BMI) 40.0 and over, adult: Secondary | ICD-10-CM

## 2018-08-16 MED ORDER — VITAMIN D (ERGOCALCIFEROL) 1.25 MG (50000 UNIT) PO CAPS: 50000.0000 [IU] | ORAL_CAPSULE | ORAL | 0 refills | Status: DC
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
## 2018-08-21 ENCOUNTER — Ambulatory Visit (INDEPENDENT_AMBULATORY_CARE_PROVIDER_SITE_OTHER): Payer: BC Managed Care – PPO | Admitting: *Deleted

## 2018-08-21 DIAGNOSIS — I480 Paroxysmal atrial fibrillation: Secondary | ICD-10-CM

## 2018-08-22 NOTE — Progress Notes (Signed)
Carelink Summary Report / Loop Recorder 

## 2018-08-31 ENCOUNTER — Ambulatory Visit (INDEPENDENT_AMBULATORY_CARE_PROVIDER_SITE_OTHER): Payer: BC Managed Care – PPO | Admitting: Bariatrics

## 2018-08-31 VITALS — BP 159/85 | HR 76 | Temp 98.5°F | Ht 64.0 in | Wt 346.0 lb

## 2018-08-31 DIAGNOSIS — E559 Vitamin D deficiency, unspecified: Secondary | ICD-10-CM | POA: Diagnosis not present

## 2018-08-31 DIAGNOSIS — I10 Essential (primary) hypertension: Secondary | ICD-10-CM

## 2018-08-31 DIAGNOSIS — Z6841 Body Mass Index (BMI) 40.0 and over, adult: Secondary | ICD-10-CM

## 2018-08-31 DIAGNOSIS — E119 Type 2 diabetes mellitus without complications: Secondary | ICD-10-CM

## 2018-09-04 LAB — CUP PACEART REMOTE DEVICE CHECK
Date Time Interrogation Session: 20191014004049
MDC IDC PG IMPLANT DT: 20180523
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
## 2018-09-05 DIAGNOSIS — E119 Type 2 diabetes mellitus without complications: Secondary | ICD-10-CM | POA: Insufficient documentation

## 2018-09-05 DIAGNOSIS — Z6841 Body Mass Index (BMI) 40.0 and over, adult: Secondary | ICD-10-CM

## 2018-09-10 ENCOUNTER — Other Ambulatory Visit (INDEPENDENT_AMBULATORY_CARE_PROVIDER_SITE_OTHER): Payer: Self-pay | Admitting: Bariatrics

## 2018-09-10 DIAGNOSIS — E559 Vitamin D deficiency, unspecified: Secondary | ICD-10-CM

## 2018-09-14 ENCOUNTER — Encounter (INDEPENDENT_AMBULATORY_CARE_PROVIDER_SITE_OTHER): Payer: Self-pay | Admitting: Bariatrics

## 2018-09-14 ENCOUNTER — Ambulatory Visit (INDEPENDENT_AMBULATORY_CARE_PROVIDER_SITE_OTHER): Payer: BC Managed Care – PPO | Admitting: Bariatrics

## 2018-09-14 VITALS — BP 138/75 | HR 56 | Temp 98.4°F | Ht 64.0 in | Wt 343.0 lb

## 2018-09-14 DIAGNOSIS — Z9189 Other specified personal risk factors, not elsewhere classified: Secondary | ICD-10-CM | POA: Diagnosis not present

## 2018-09-14 DIAGNOSIS — E119 Type 2 diabetes mellitus without complications: Secondary | ICD-10-CM | POA: Diagnosis not present

## 2018-09-14 DIAGNOSIS — I10 Essential (primary) hypertension: Secondary | ICD-10-CM | POA: Diagnosis not present

## 2018-09-14 DIAGNOSIS — E559 Vitamin D deficiency, unspecified: Secondary | ICD-10-CM | POA: Diagnosis not present

## 2018-09-14 DIAGNOSIS — Z6841 Body Mass Index (BMI) 40.0 and over, adult: Secondary | ICD-10-CM

## 2018-09-14 MED ORDER — VITAMIN D (ERGOCALCIFEROL) 1.25 MG (50000 UNIT) PO CAPS: 50000.0000 [IU] | ORAL_CAPSULE | ORAL | 0 refills | Status: DC

## 2018-09-19 ENCOUNTER — Encounter (INDEPENDENT_AMBULATORY_CARE_PROVIDER_SITE_OTHER): Payer: Self-pay | Admitting: Bariatrics
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
## 2018-09-22 ENCOUNTER — Ambulatory Visit (INDEPENDENT_AMBULATORY_CARE_PROVIDER_SITE_OTHER): Payer: BC Managed Care – PPO | Admitting: *Deleted

## 2018-09-22 DIAGNOSIS — I480 Paroxysmal atrial fibrillation: Secondary | ICD-10-CM | POA: Diagnosis not present

## 2018-09-25 ENCOUNTER — Telehealth: Payer: Self-pay

## 2018-09-25 NOTE — Progress Notes (Signed)
Carelink Summary Report / Loop Recorder 

## 2018-09-25 NOTE — Telephone Encounter (Signed)
Spoke w/ pt and requested that she send a manual transmission b/c her home monitor has not updated in at least 14 days.   

## 2018-10-02 ENCOUNTER — Ambulatory Visit (INDEPENDENT_AMBULATORY_CARE_PROVIDER_SITE_OTHER): Payer: BC Managed Care – PPO | Admitting: Bariatrics

## 2018-10-02 ENCOUNTER — Encounter (INDEPENDENT_AMBULATORY_CARE_PROVIDER_SITE_OTHER): Payer: Self-pay | Admitting: Bariatrics

## 2018-10-02 VITALS — BP 151/85 | HR 72 | Temp 97.8°F | Ht 64.0 in | Wt 337.0 lb

## 2018-10-02 DIAGNOSIS — Z9189 Other specified personal risk factors, not elsewhere classified: Secondary | ICD-10-CM

## 2018-10-02 DIAGNOSIS — E119 Type 2 diabetes mellitus without complications: Secondary | ICD-10-CM

## 2018-10-02 DIAGNOSIS — E559 Vitamin D deficiency, unspecified: Secondary | ICD-10-CM | POA: Diagnosis not present

## 2018-10-02 DIAGNOSIS — Z6841 Body Mass Index (BMI) 40.0 and over, adult: Secondary | ICD-10-CM

## 2018-10-02 MED ORDER — VITAMIN D (ERGOCALCIFEROL) 1.25 MG (50000 UNIT) PO CAPS: 50000.0000 [IU] | ORAL_CAPSULE | ORAL | 0 refills | Status: DC

## 2018-10-09 ENCOUNTER — Encounter (INDEPENDENT_AMBULATORY_CARE_PROVIDER_SITE_OTHER): Payer: Self-pay | Admitting: Bariatrics
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
## 2018-10-19 ENCOUNTER — Other Ambulatory Visit (HOSPITAL_COMMUNITY): Payer: Self-pay | Admitting: Nurse Practitioner

## 2018-10-23 ENCOUNTER — Ambulatory Visit (INDEPENDENT_AMBULATORY_CARE_PROVIDER_SITE_OTHER): Payer: BC Managed Care – PPO | Admitting: Bariatrics

## 2018-10-23 ENCOUNTER — Encounter (INDEPENDENT_AMBULATORY_CARE_PROVIDER_SITE_OTHER): Payer: Self-pay | Admitting: Bariatrics

## 2018-10-23 VITALS — BP 145/80 | HR 78 | Temp 97.9°F | Ht 64.0 in | Wt 339.0 lb

## 2018-10-23 DIAGNOSIS — E559 Vitamin D deficiency, unspecified: Secondary | ICD-10-CM

## 2018-10-23 DIAGNOSIS — E119 Type 2 diabetes mellitus without complications: Secondary | ICD-10-CM

## 2018-10-23 DIAGNOSIS — Z9189 Other specified personal risk factors, not elsewhere classified: Secondary | ICD-10-CM | POA: Diagnosis not present

## 2018-10-23 DIAGNOSIS — Z6841 Body Mass Index (BMI) 40.0 and over, adult: Secondary | ICD-10-CM

## 2018-10-23 MED ORDER — VITAMIN D (ERGOCALCIFEROL) 1.25 MG (50000 UNIT) PO CAPS: 50000.0000 [IU] | ORAL_CAPSULE | ORAL | 0 refills | Status: DC
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
## 2018-10-25 ENCOUNTER — Ambulatory Visit (INDEPENDENT_AMBULATORY_CARE_PROVIDER_SITE_OTHER): Payer: BC Managed Care – PPO

## 2018-10-25 DIAGNOSIS — I480 Paroxysmal atrial fibrillation: Secondary | ICD-10-CM | POA: Diagnosis not present

## 2018-10-26 NOTE — Progress Notes (Signed)
Carelink Summary Report / Loop Recorder 

## 2018-11-09 ENCOUNTER — Ambulatory Visit (INDEPENDENT_AMBULATORY_CARE_PROVIDER_SITE_OTHER): Payer: BC Managed Care – PPO | Admitting: Bariatrics

## 2018-11-12 LAB — CUP PACEART REMOTE DEVICE CHECK
Implantable Pulse Generator Implant Date: 20180523
MDC IDC SESS DTM: 20191116024054

## 2018-11-12 IMAGING — DX DG CHEST 1V PORT
1 series · 1 of 1 positions shown · non-contrast
Comparison: Chest x-ray of April 12, 2007

CLINICAL DATA: Weakness syncopal episode lasting 1 minutes
associated with vomiting after regaining consciousness. History of
hypertension, atrial tachycardia, morbid obesity.

EXAM:
PORTABLE CHEST 1 VIEW

[chest ap]
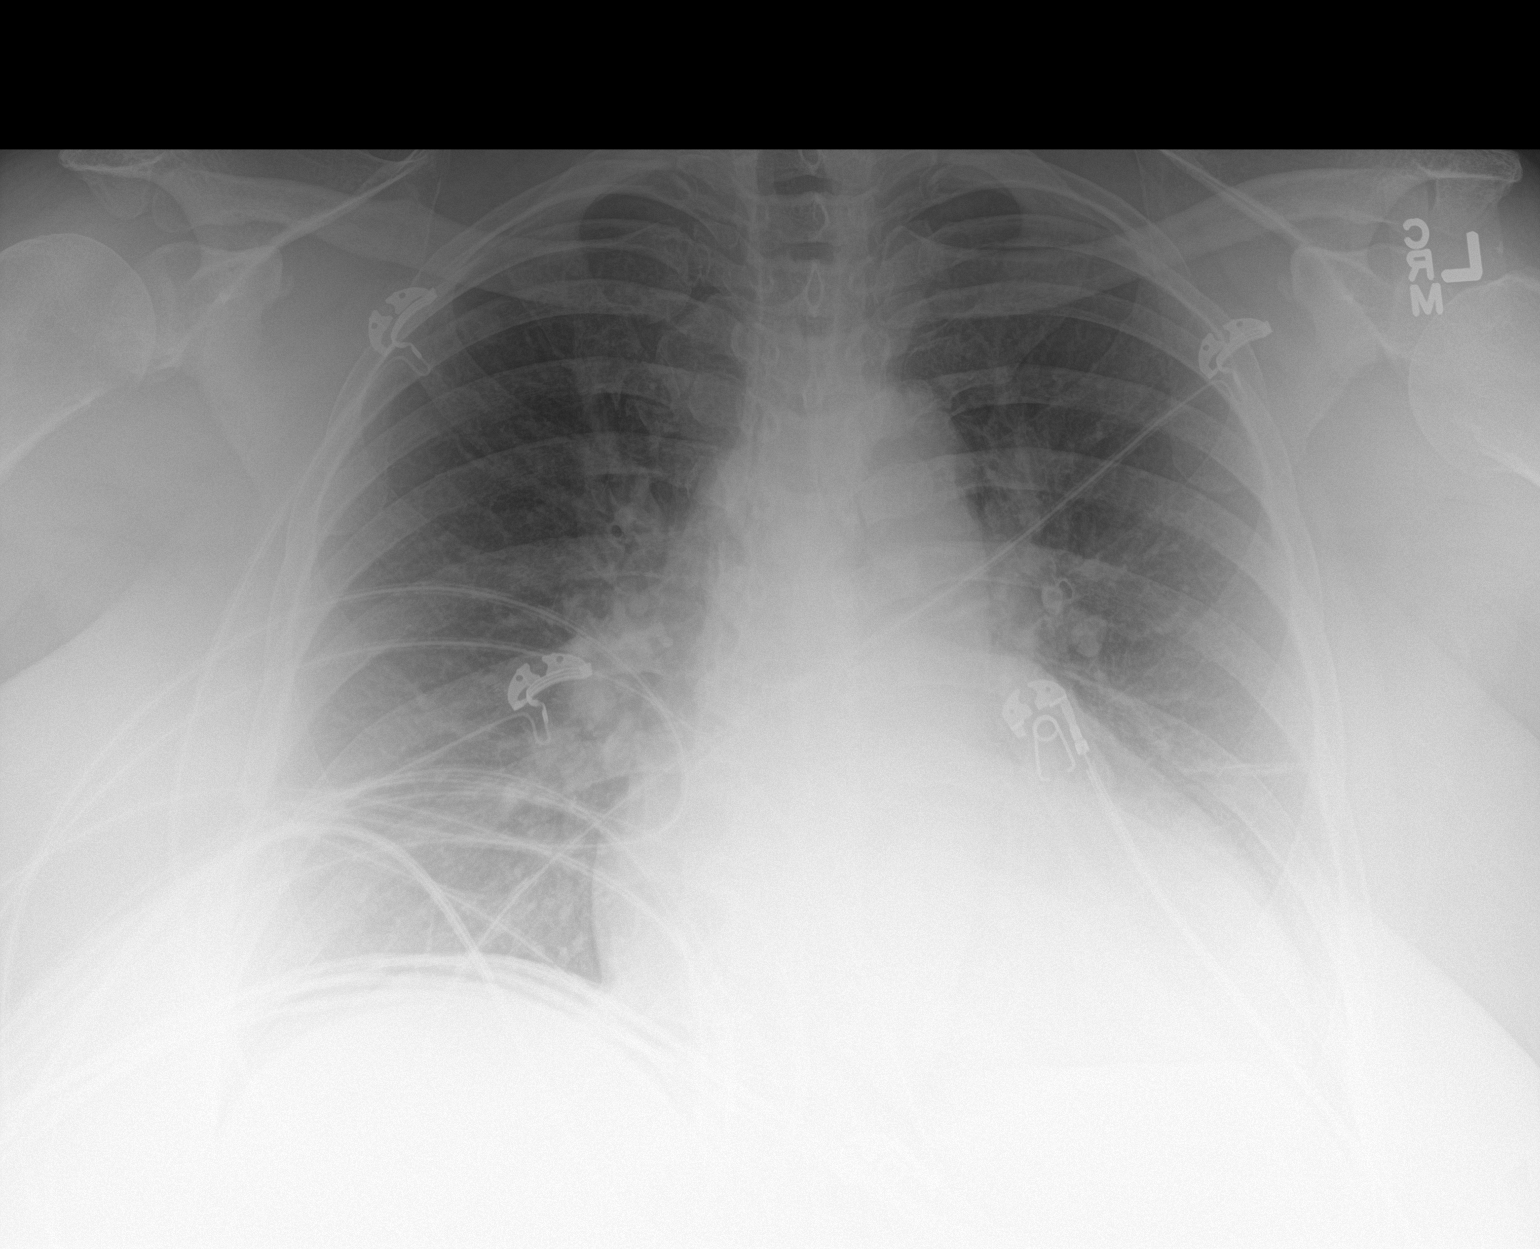

[1 of 1 positions shown; findings below may reference images not displayed]

FINDINGS: The lungs are well-expanded. There is no pleural effusion or focal
infiltrate. There is linear density lateral to the left heart
border. The cardiac silhouette is enlarged. The pulmonary
vascularity is engorged with mild cephalization. The trachea is
midline. The observed bony thorax is unremarkable.
IMPRESSION: Cardiomegaly with mild pulmonary vascular congestion without
interstitial edema.

Probable subsegmental atelectasis versus scarring in the left mid
lung.

## 2018-11-18 LAB — CUP PACEART REMOTE DEVICE CHECK
Date Time Interrogation Session: 20191219030929
Implantable Pulse Generator Implant Date: 20180523

## 2018-11-20 ENCOUNTER — Encounter (INDEPENDENT_AMBULATORY_CARE_PROVIDER_SITE_OTHER): Payer: Self-pay | Admitting: Bariatrics

## 2018-11-20 ENCOUNTER — Ambulatory Visit (INDEPENDENT_AMBULATORY_CARE_PROVIDER_SITE_OTHER): Payer: BC Managed Care – PPO | Admitting: Bariatrics

## 2018-11-20 VITALS — BP 147/77 | HR 62 | Temp 98.3°F | Ht 64.0 in | Wt 338.0 lb

## 2018-11-20 DIAGNOSIS — Z9189 Other specified personal risk factors, not elsewhere classified: Secondary | ICD-10-CM

## 2018-11-20 DIAGNOSIS — I10 Essential (primary) hypertension: Secondary | ICD-10-CM | POA: Diagnosis not present

## 2018-11-20 DIAGNOSIS — Z6841 Body Mass Index (BMI) 40.0 and over, adult: Secondary | ICD-10-CM

## 2018-11-20 DIAGNOSIS — E559 Vitamin D deficiency, unspecified: Secondary | ICD-10-CM

## 2018-11-20 MED ORDER — VITAMIN D (ERGOCALCIFEROL) 1.25 MG (50000 UNIT) PO CAPS: 50000.0000 [IU] | ORAL_CAPSULE | ORAL | 0 refills | Status: DC
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
## 2018-11-22 ENCOUNTER — Other Ambulatory Visit (INDEPENDENT_AMBULATORY_CARE_PROVIDER_SITE_OTHER): Payer: Self-pay | Admitting: Bariatrics

## 2018-11-22 DIAGNOSIS — E559 Vitamin D deficiency, unspecified: Secondary | ICD-10-CM

## 2018-11-27 ENCOUNTER — Ambulatory Visit (INDEPENDENT_AMBULATORY_CARE_PROVIDER_SITE_OTHER): Payer: BC Managed Care – PPO

## 2018-11-27 DIAGNOSIS — I480 Paroxysmal atrial fibrillation: Secondary | ICD-10-CM | POA: Diagnosis not present

## 2018-11-28 LAB — CUP PACEART REMOTE DEVICE CHECK
Date Time Interrogation Session: 20200121030722
Implantable Pulse Generator Implant Date: 20180523

## 2018-11-28 NOTE — Progress Notes (Signed)
Carelink Summary Report / Loop Recorder 

## 2018-12-11 ENCOUNTER — Encounter (INDEPENDENT_AMBULATORY_CARE_PROVIDER_SITE_OTHER): Payer: Self-pay | Admitting: Bariatrics

## 2018-12-11 ENCOUNTER — Ambulatory Visit (INDEPENDENT_AMBULATORY_CARE_PROVIDER_SITE_OTHER): Payer: BC Managed Care – PPO | Admitting: Bariatrics

## 2018-12-11 VITALS — BP 125/84 | HR 64 | Temp 97.8°F | Ht 64.0 in | Wt 335.0 lb

## 2018-12-11 DIAGNOSIS — E559 Vitamin D deficiency, unspecified: Secondary | ICD-10-CM

## 2018-12-11 DIAGNOSIS — E7849 Other hyperlipidemia: Secondary | ICD-10-CM

## 2018-12-11 DIAGNOSIS — I10 Essential (primary) hypertension: Secondary | ICD-10-CM | POA: Diagnosis not present

## 2018-12-11 DIAGNOSIS — E119 Type 2 diabetes mellitus without complications: Secondary | ICD-10-CM

## 2018-12-11 DIAGNOSIS — Z9189 Other specified personal risk factors, not elsewhere classified: Secondary | ICD-10-CM

## 2018-12-11 DIAGNOSIS — Z6841 Body Mass Index (BMI) 40.0 and over, adult: Secondary | ICD-10-CM

## 2018-12-11 MED ORDER — VITAMIN D (ERGOCALCIFEROL) 1.25 MG (50000 UNIT) PO CAPS: 50000.0000 [IU] | ORAL_CAPSULE | ORAL | 0 refills | Status: DC

## 2018-12-11 MED ORDER — ATORVASTATIN CALCIUM 10 MG PO TABS: 10.0000 mg | ORAL_TABLET | Freq: Every day | ORAL | 0 refills | Status: DC

## 2018-12-12 LAB — COMPREHENSIVE METABOLIC PANEL
ALK PHOS: 78 IU/L (ref 39–117)
ALT: 13 IU/L (ref 0–32)
AST: 11 IU/L (ref 0–40)
Albumin/Globulin Ratio: 1.4 (ref 1.2–2.2)
Albumin: 4.1 g/dL (ref 3.8–4.9)
BUN/Creatinine Ratio: 13 (ref 12–28)
BUN: 11 mg/dL (ref 8–27)
Bilirubin Total: 0.4 mg/dL (ref 0.0–1.2)
CO2: 24 mmol/L (ref 20–29)
Calcium: 9.2 mg/dL (ref 8.7–10.3)
Chloride: 101 mmol/L (ref 96–106)
Creatinine, Ser: 0.84 mg/dL (ref 0.57–1.00)
GFR calc Af Amer: 87 mL/min/{1.73_m2} (ref >59)
GFR calc non Af Amer: 76 mL/min/{1.73_m2} (ref >59)
GLUCOSE: 105 mg/dL — AB (ref 65–99)
Globulin, Total: 2.9 g/dL (ref 1.5–4.5)
Potassium: 4.4 mmol/L (ref 3.5–5.2)
Sodium: 144 mmol/L (ref 134–144)
Total Protein: 7 g/dL (ref 6.0–8.5)

## 2018-12-12 LAB — INSULIN, RANDOM: INSULIN: 14.2 u[IU]/mL (ref 2.6–24.9)

## 2018-12-12 LAB — VITAMIN D 25 HYDROXY (VIT D DEFICIENCY, FRACTURES): Vit D, 25-Hydroxy: 26.3 ng/mL — ABNORMAL LOW (ref 30.0–100.0)

## 2018-12-12 LAB — HEMOGLOBIN A1C
Est. average glucose Bld gHb Est-mCnc: 137 mg/dL
HEMOGLOBIN A1C: 6.4 % — AB (ref 4.8–5.6)
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
## 2018-12-18 ENCOUNTER — Telehealth: Payer: Self-pay | Admitting: *Deleted

## 2018-12-18 NOTE — Telephone Encounter (Signed)
Rennis Harding, RN, spoke with patient to discuss recent elevation in avg V rate per LINQ. Patient reports having frozen shoulder beginning in mid-January. She was started on anti-inflammatories, pain medication, and muscle relaxants. Then struggled with GERD, now on reflux meds. Patient reports compliance with all cardiac meds, though she hasn't felt the need to take PRN diltiazem. Patient has been feeling better since ~12/09/18.   Will route this message to Dr. Sallyanne Kuster for review and recommendations.

## 2018-12-19 NOTE — Telephone Encounter (Signed)
Seems to maybe have a persistent atrial flutter. Might need a cardioversion. Could we get her into the AFib clinic, please? I will not be available 2/14-2/20 MCr

## 2018-12-20 NOTE — Telephone Encounter (Signed)
LMOVM requesting call back to the DC. Will offer AF Clinic appointment.

## 2018-12-21 NOTE — Telephone Encounter (Signed)
Spoke with patient.  Instructed her that Dr Sallyanne Kuster would like her to be seen in the afib clinic.  Appt scheduled for Friday 2/21 @ 3:30 with Ricky,PA.  Gave her Afib clinic number as well as directions to office.

## 2018-12-25 ENCOUNTER — Ambulatory Visit (INDEPENDENT_AMBULATORY_CARE_PROVIDER_SITE_OTHER): Payer: BC Managed Care – PPO | Admitting: Bariatrics

## 2018-12-25 VITALS — BP 113/77 | HR 102 | Temp 97.7°F | Ht 64.0 in | Wt 337.0 lb

## 2018-12-25 DIAGNOSIS — E119 Type 2 diabetes mellitus without complications: Secondary | ICD-10-CM | POA: Diagnosis not present

## 2018-12-25 DIAGNOSIS — I4891 Unspecified atrial fibrillation: Secondary | ICD-10-CM

## 2018-12-25 DIAGNOSIS — I10 Essential (primary) hypertension: Secondary | ICD-10-CM | POA: Diagnosis not present

## 2018-12-25 DIAGNOSIS — E559 Vitamin D deficiency, unspecified: Secondary | ICD-10-CM

## 2018-12-25 DIAGNOSIS — Z9189 Other specified personal risk factors, not elsewhere classified: Secondary | ICD-10-CM | POA: Diagnosis not present

## 2018-12-25 DIAGNOSIS — Z6841 Body Mass Index (BMI) 40.0 and over, adult: Secondary | ICD-10-CM

## 2018-12-25 MED ORDER — CHOLECALCIFEROL 1.25 MG (50000 UT) PO CAPS: 50000.0000 [IU] | ORAL_CAPSULE | ORAL | 0 refills | Status: DC

## 2018-12-25 MED ORDER — DILTIAZEM HCL 30 MG PO TABS: 30.0000 mg | ORAL_TABLET | ORAL | 0 refills | Status: DC | PRN
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
## 2018-12-27 ENCOUNTER — Encounter (INDEPENDENT_AMBULATORY_CARE_PROVIDER_SITE_OTHER): Payer: Self-pay | Admitting: Bariatrics

## 2018-12-29 ENCOUNTER — Encounter (HOSPITAL_COMMUNITY): Payer: Self-pay | Admitting: Physician Assistant

## 2018-12-29 ENCOUNTER — Ambulatory Visit (HOSPITAL_COMMUNITY)
Admission: RE | Admit: 2018-12-29 | Discharge: 2018-12-29 | Disposition: A | Discharge status: Home / Self Care | Payer: BC Managed Care – PPO | Source: Ambulatory Visit | Attending: Physician Assistant | Admitting: Physician Assistant

## 2018-12-29 VITALS — BP 142/84 | HR 85 | Ht 64.0 in | Wt 338.0 lb

## 2018-12-29 DIAGNOSIS — I483 Typical atrial flutter: Secondary | ICD-10-CM | POA: Diagnosis not present

## 2018-12-29 DIAGNOSIS — I4891 Unspecified atrial fibrillation: Secondary | ICD-10-CM | POA: Diagnosis not present

## 2018-12-29 DIAGNOSIS — I484 Atypical atrial flutter: Secondary | ICD-10-CM

## 2018-12-29 DIAGNOSIS — I10 Essential (primary) hypertension: Secondary | ICD-10-CM | POA: Diagnosis not present

## 2018-12-29 DIAGNOSIS — Z7984 Long term (current) use of oral hypoglycemic drugs: Secondary | ICD-10-CM | POA: Diagnosis not present

## 2018-12-29 DIAGNOSIS — Z79899 Other long term (current) drug therapy: Secondary | ICD-10-CM | POA: Insufficient documentation

## 2018-12-29 DIAGNOSIS — Z7901 Long term (current) use of anticoagulants: Secondary | ICD-10-CM | POA: Diagnosis not present

## 2018-12-29 DIAGNOSIS — E119 Type 2 diabetes mellitus without complications: Secondary | ICD-10-CM | POA: Diagnosis not present

## 2018-12-29 DIAGNOSIS — I4819 Other persistent atrial fibrillation: Secondary | ICD-10-CM | POA: Insufficient documentation

## 2018-12-29 LAB — BASIC METABOLIC PANEL
Anion gap: 9 (ref 5–15)
BUN: 15 mg/dL (ref 6–20)
CO2: 27 mmol/L (ref 22–32)
Calcium: 9.1 mg/dL (ref 8.9–10.3)
Chloride: 102 mmol/L (ref 98–111)
Creatinine, Ser: 0.94 mg/dL (ref 0.44–1.00)
GFR calc Af Amer: >60 mL/min (ref >60)
GFR calc non Af Amer: >60 mL/min (ref >60)
Glucose, Bld: 90 mg/dL (ref 70–99)
Potassium: 4.1 mmol/L (ref 3.5–5.1)
Sodium: 138 mmol/L (ref 135–145)

## 2018-12-29 LAB — CBC WITH DIFFERENTIAL/PLATELET
Abs Immature Granulocytes: 0.02 10*3/uL (ref 0.00–0.07)
Basophils Absolute: 0 10*3/uL (ref 0.0–0.1)
Basophils Relative: 1 %
EOS ABS: 0.1 10*3/uL (ref 0.0–0.5)
Eosinophils Relative: 1 %
HEMATOCRIT: 43.6 % (ref 36.0–46.0)
Hemoglobin: 13.4 g/dL (ref 12.0–15.0)
Immature Granulocytes: 0 %
Lymphocytes Relative: 35 %
Lymphs Abs: 2.6 10*3/uL (ref 0.7–4.0)
MCH: 28.3 pg (ref 26.0–34.0)
MCHC: 30.7 g/dL (ref 30.0–36.0)
MCV: 92.2 fL (ref 80.0–100.0)
Monocytes Absolute: 0.5 10*3/uL (ref 0.1–1.0)
Monocytes Relative: 7 %
Neutro Abs: 4.2 10*3/uL (ref 1.7–7.7)
Neutrophils Relative %: 56 %
Platelets: 320 10*3/uL (ref 150–400)
RBC: 4.73 MIL/uL (ref 3.87–5.11)
RDW: 13.7 % (ref 11.5–15.5)
WBC: 7.5 10*3/uL (ref 4.0–10.5)
nRBC: 0 % (ref 0.0–0.2)

## 2018-12-29 MED ORDER — DILTIAZEM HCL 30 MG PO TABS: 30.0000 mg | ORAL_TABLET | ORAL | 0 refills | Status: DC | PRN
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
## 2018-12-29 NOTE — H&P (View-Only) (Signed)
Thanks! Look forward to meeting you one day... MCr

## 2018-12-29 NOTE — Progress Notes (Signed)
Thanks! Look forward to meeting you one day... MCr

## 2019-01-01 ENCOUNTER — Ambulatory Visit (INDEPENDENT_AMBULATORY_CARE_PROVIDER_SITE_OTHER): Payer: BC Managed Care – PPO | Admitting: *Deleted

## 2019-01-01 ENCOUNTER — Other Ambulatory Visit (HOSPITAL_COMMUNITY): Payer: Self-pay | Admitting: *Deleted

## 2019-01-01 ENCOUNTER — Encounter (INDEPENDENT_AMBULATORY_CARE_PROVIDER_SITE_OTHER): Payer: Self-pay | Admitting: Bariatrics

## 2019-01-01 DIAGNOSIS — I4892 Unspecified atrial flutter: Secondary | ICD-10-CM

## 2019-01-01 DIAGNOSIS — I4891 Unspecified atrial fibrillation: Secondary | ICD-10-CM

## 2019-01-01 NOTE — Progress Notes (Signed)
Thanks, [employer]

## 2019-01-02 LAB — CUP PACEART REMOTE DEVICE CHECK
Date Time Interrogation Session: 20200223034242
Implantable Pulse Generator Implant Date: 20180523

## 2019-01-04 ENCOUNTER — Other Ambulatory Visit (INDEPENDENT_AMBULATORY_CARE_PROVIDER_SITE_OTHER): Payer: Self-pay | Admitting: Bariatrics

## 2019-01-04 DIAGNOSIS — E559 Vitamin D deficiency, unspecified: Secondary | ICD-10-CM

## 2019-01-05 ENCOUNTER — Other Ambulatory Visit (INDEPENDENT_AMBULATORY_CARE_PROVIDER_SITE_OTHER): Payer: Self-pay | Admitting: Bariatrics

## 2019-01-05 DIAGNOSIS — E559 Vitamin D deficiency, unspecified: Secondary | ICD-10-CM

## 2019-01-06 ENCOUNTER — Other Ambulatory Visit (INDEPENDENT_AMBULATORY_CARE_PROVIDER_SITE_OTHER): Payer: Self-pay | Admitting: Bariatrics

## 2019-01-06 DIAGNOSIS — E7849 Other hyperlipidemia: Secondary | ICD-10-CM

## 2019-01-09 ENCOUNTER — Encounter (HOSPITAL_COMMUNITY)
Admission: RE | Disposition: A | Discharge status: Home / Self Care | Payer: Self-pay | Source: Home / Self Care | Attending: Cardiology

## 2019-01-09 ENCOUNTER — Ambulatory Visit (HOSPITAL_COMMUNITY): Payer: BC Managed Care – PPO | Admitting: Anesthesiology

## 2019-01-09 ENCOUNTER — Ambulatory Visit (HOSPITAL_COMMUNITY)
Admission: RE | Admit: 2019-01-09 | Discharge: 2019-01-09 | Disposition: A | Discharge status: Home / Self Care | Payer: BC Managed Care – PPO | Attending: Cardiology | Admitting: Cardiology

## 2019-01-09 ENCOUNTER — Ambulatory Visit (INDEPENDENT_AMBULATORY_CARE_PROVIDER_SITE_OTHER): Payer: BC Managed Care – PPO | Admitting: Bariatrics

## 2019-01-09 ENCOUNTER — Encounter (HOSPITAL_COMMUNITY): Payer: Self-pay | Admitting: *Deleted

## 2019-01-09 ENCOUNTER — Other Ambulatory Visit: Payer: Self-pay

## 2019-01-09 DIAGNOSIS — E119 Type 2 diabetes mellitus without complications: Secondary | ICD-10-CM | POA: Insufficient documentation

## 2019-01-09 DIAGNOSIS — Z7984 Long term (current) use of oral hypoglycemic drugs: Secondary | ICD-10-CM | POA: Diagnosis not present

## 2019-01-09 DIAGNOSIS — Z7901 Long term (current) use of anticoagulants: Secondary | ICD-10-CM | POA: Diagnosis not present

## 2019-01-09 DIAGNOSIS — I4819 Other persistent atrial fibrillation: Secondary | ICD-10-CM | POA: Diagnosis present

## 2019-01-09 DIAGNOSIS — I10 Essential (primary) hypertension: Secondary | ICD-10-CM | POA: Insufficient documentation

## 2019-01-09 DIAGNOSIS — I480 Paroxysmal atrial fibrillation: Secondary | ICD-10-CM

## 2019-01-09 DIAGNOSIS — I4891 Unspecified atrial fibrillation: Secondary | ICD-10-CM

## 2019-01-09 DIAGNOSIS — F419 Anxiety disorder, unspecified: Secondary | ICD-10-CM | POA: Diagnosis not present

## 2019-01-09 DIAGNOSIS — Z6841 Body Mass Index (BMI) 40.0 and over, adult: Secondary | ICD-10-CM | POA: Diagnosis not present

## 2019-01-09 DIAGNOSIS — I4892 Unspecified atrial flutter: Secondary | ICD-10-CM | POA: Diagnosis not present

## 2019-01-09 DIAGNOSIS — Z79899 Other long term (current) drug therapy: Secondary | ICD-10-CM | POA: Insufficient documentation

## 2019-01-09 HISTORY — PX: CARDIOVERSION: SHX1299

## 2019-01-09 LAB — GLUCOSE, CAPILLARY: Glucose-Capillary: 87 mg/dL (ref 70–99)

## 2019-01-09 SURGERY — CARDIOVERSION
Anesthesia: General

## 2019-01-09 MED ORDER — APIXABAN 5 MG PO TABS: 5.0000 mg | ORAL_TABLET | Freq: Two times a day (BID) | ORAL | Status: DC

## 2019-01-09 MED ORDER — DILTIAZEM HCL 30 MG PO TABS: 30.0000 mg | ORAL_TABLET | ORAL | Status: DC | PRN

## 2019-01-09 MED ORDER — PROPOFOL 10 MG/ML IV BOLUS
INTRAVENOUS | Status: DC | PRN
  Administered 2019-01-09: 80 mg via INTRAVENOUS

## 2019-01-09 MED ORDER — ATENOLOL 100 MG PO TABS: 100.0000 mg | ORAL_TABLET | Freq: Two times a day (BID) | ORAL | Status: DC

## 2019-01-09 MED ORDER — LIDOCAINE 2% (20 MG/ML) 5 ML SYRINGE
INTRAMUSCULAR | Status: DC | PRN
  Administered 2019-01-09: 40 mg via INTRAVENOUS

## 2019-01-09 MED ORDER — FLECAINIDE ACETATE 100 MG PO TABS: 100.0000 mg | ORAL_TABLET | Freq: Two times a day (BID) | ORAL | Status: DC

## 2019-01-09 MED ORDER — CHOLECALCIFEROL 1.25 MG (50000 UT) PO CAPS: 50000.0000 [IU] | ORAL_CAPSULE | ORAL | Status: DC

## 2019-01-09 NOTE — Progress Notes (Signed)
Carelink Summary Report / Loop Recorder 

## 2019-01-09 NOTE — Anesthesia Postprocedure Evaluation (Signed)
Anesthesia Post Note  Patient: Tisha Pesci  Procedure(s) Performed: CARDIOVERSION (N/A )     Patient location during evaluation: PACU Anesthesia Type: General Level of consciousness: awake and alert Pain management: pain level controlled Vital Signs Assessment: post-procedure vital signs reviewed and stable Respiratory status: spontaneous breathing, nonlabored ventilation and respiratory function stable Cardiovascular status: blood pressure returned to baseline and stable Postop Assessment: no apparent nausea or vomiting Anesthetic complications: no    Last Vitals:  Vitals:   01/09/19 1327 01/09/19 1331  BP:  (!) 151/71  Pulse: (!) 58 (!) 57  Resp: 20 13  Temp:  36.8 C  SpO2: 98% 100%    Last Pain:  Vitals:   01/09/19 1331  TempSrc: Oral  PainSc: 0-No pain                 Brennan Bailey

## 2019-01-09 NOTE — Anesthesia Preprocedure Evaluation (Addendum)
Anesthesia Evaluation  Patient identified by MRN, date of birth, ID band Patient awake    Reviewed: Allergy & Precautions, NPO status , Patient's Chart, lab work & pertinent test results, reviewed documented beta blocker date and time   History of Anesthesia Complications Negative for: history of anesthetic complications  Airway Mallampati: II  TM Distance: >3 FB Neck ROM: Full    Dental  (+) Teeth Intact, Dental Advisory Given   Pulmonary neg pulmonary ROS,    Pulmonary exam normal breath sounds clear to auscultation       Cardiovascular hypertension, Pt. on medications and Pt. on home beta blockers Normal cardiovascular exam+ dysrhythmias (on Eliquis) Atrial Fibrillation  Rhythm:Regular Rate:Normal  TTE 10/2017: EF 60-65%, mild MR, moderate LAE    Neuro/Psych  Headaches,    GI/Hepatic Neg liver ROS, GERD  Medicated and Controlled,  Endo/Other  diabetes, Type 2Morbid obesity  Renal/GU negative Renal ROS     Musculoskeletal negative musculoskeletal ROS (+)   Abdominal   Peds  Hematology  (+) REFUSES BLOOD PRODUCTS,   Anesthesia Other Findings Day of surgery medications reviewed with the patient.  Reproductive/Obstetrics                          Anesthesia Physical Anesthesia Plan  ASA: III  Anesthesia Plan: General   Post-op Pain Management:    Induction: Intravenous  PONV Risk Score and Plan: Treatment may vary due to age or medical condition and Propofol infusion  Airway Management Planned: Mask  Additional Equipment:   Intra-op Plan:   Post-operative Plan:   Informed Consent: I have reviewed the patients History and Physical, chart, labs and discussed the procedure including the risks, benefits and alternatives for the proposed anesthesia with the patient or authorized representative who has indicated his/her understanding and acceptance.     Dental advisory given  Plan  Discussed with: CRNA  Anesthesia Plan Comments:        Anesthesia Quick Evaluation

## 2019-01-09 NOTE — Discharge Instructions (Signed)
Electrical Cardioversion, Care After °This sheet gives you information about how to care for yourself after your procedure. Your health care provider may also give you more specific instructions. If you have problems or questions, contact your health care provider. °What can I expect after the procedure? °After the procedure, it is common to have: °· Some redness on the skin where the shocks were given. °Follow these instructions at home: ° °· Do not drive for 24 hours if you were given a medicine to help you relax (sedative). °· Take over-the-counter and prescription medicines only as told by your health care provider. °· Ask your health care provider how to check your pulse. Check it often. °· Rest for 48 hours after the procedure or as told by your health care provider. °· Avoid or limit your caffeine use as told by your health care provider. °Contact a health care provider if: °· You feel like your heart is beating too quickly or your pulse is not regular. °· You have a serious muscle cramp that does not go away. °Get help right away if: ° °· You have discomfort in your chest. °· You are dizzy or you feel faint. °· You have trouble breathing or you are short of breath. °· Your speech is slurred. °· You have trouble moving an arm or leg on one side of your body. °· Your fingers or toes turn cold or blue. °This information is not intended to replace advice given to you by your health care provider. Make sure you discuss any questions you have with your health care provider. °Document Released: 08/15/2013 Document Revised: 05/28/2016 Document Reviewed: 04/30/2016 °Elsevier Interactive Patient Education © 2019 [employer] ° °

## 2019-01-09 NOTE — Transfer of Care (Signed)
Immediate Anesthesia Transfer of Care Note  Patient: Vicki Wheeler  Procedure(s) Performed: CARDIOVERSION (N/A )  Patient Location: Endoscopy Unit  Anesthesia Type:General  Level of Consciousness: awake and patient cooperative  Airway & Oxygen Therapy: Patient Spontanous Breathing  Post-op Assessment: Report given to RN and Post -op Vital signs reviewed and stable  Post vital signs: Reviewed and stable  Last Vitals:  Vitals Value Taken Time  BP 145/75 01/09/2019  1:26 PM  Temp    Pulse 59 01/09/2019  1:28 PM  Resp 15 01/09/2019  1:28 PM  SpO2 96 % 01/09/2019  1:28 PM  Vitals shown include unvalidated device data.  Last Pain:  Vitals:   01/09/19 1202  TempSrc: Oral  PainSc: 0-No pain         Complications: No apparent anesthesia complications

## 2019-01-09 NOTE — Interval H&P Note (Signed)
History and Physical Interval Note:  01/09/2019 1:04 PM  Vicki Wheeler  has presented today for surgery, with the diagnosis of atrial fibrillation  The various methods of treatment have been discussed with the patient and family. After consideration of risks, benefits and other options for treatment, the patient has consented to  Procedure(s): CARDIOVERSION (N/A) as a surgical intervention .  The patient's history has been reviewed, patient examined, no change in status, stable for surgery.  I have reviewed the patient's chart and labs.  Questions were answered to the patient's satisfaction.     Fransico Him

## 2019-01-09 NOTE — Anesthesia Procedure Notes (Signed)
Procedure Name: General with mask airway Date/Time: 01/09/2019 1:20 PM Performed by: Candis Shine, CRNA Pre-anesthesia Checklist: Patient identified, Emergency Drugs available, Suction available, Patient being monitored and Timeout performed Patient Re-evaluated:Patient Re-evaluated prior to induction Oxygen Delivery Method: Ambu bag Preoxygenation: Pre-oxygenation with 100% oxygen Induction Type: IV induction Dental Injury: Teeth and Oropharynx as per pre-operative assessment

## 2019-01-09 NOTE — Progress Notes (Signed)
Thank you :)

## 2019-01-09 NOTE — CV Procedure (Signed)
   Electrical Cardioversion Procedure Note Vicki Wheeler [id number] Aug 17, 1956  Procedure: Electrical Cardioversion Indications:  Atrial Flutter  Time Out: Verified patient identification, verified procedure,medications/allergies/relevent history reviewed, required imaging and test results available.  Performed  Procedure Details  The patient was NPO after midnight. Anesthesia was administered at the beside  by Dr.Howze with 80mg  of propofol and 40mg  of Lidocaine.  Cardioversion was done with synchronized biphasic defibrillation with AP pads with 150watts.  The patient converted to normal sinus rhythm. The patient tolerated the procedure well   IMPRESSION:  Successful cardioversion of atrial flutter    Vicki Wheeler 01/09/2019, 1:04 PM

## 2019-01-12 ENCOUNTER — Encounter (HOSPITAL_COMMUNITY): Payer: Self-pay | Admitting: Cardiology

## 2019-01-16 ENCOUNTER — Ambulatory Visit (HOSPITAL_COMMUNITY)
Admission: RE | Admit: 2019-01-16 | Discharge: 2019-01-16 | Disposition: A | Discharge status: Home / Self Care | Payer: BC Managed Care – PPO | Source: Ambulatory Visit | Attending: Physician Assistant | Admitting: Physician Assistant

## 2019-01-16 ENCOUNTER — Other Ambulatory Visit: Payer: Self-pay

## 2019-01-16 VITALS — BP 132/76 | HR 60 | Ht 64.0 in | Wt 339.0 lb

## 2019-01-16 DIAGNOSIS — I4819 Other persistent atrial fibrillation: Secondary | ICD-10-CM | POA: Insufficient documentation

## 2019-01-16 DIAGNOSIS — Z8042 Family history of malignant neoplasm of prostate: Secondary | ICD-10-CM | POA: Insufficient documentation

## 2019-01-16 DIAGNOSIS — I484 Atypical atrial flutter: Secondary | ICD-10-CM | POA: Insufficient documentation

## 2019-01-16 DIAGNOSIS — Z91013 Allergy to seafood: Secondary | ICD-10-CM | POA: Diagnosis not present

## 2019-01-16 DIAGNOSIS — Z8249 Family history of ischemic heart disease and other diseases of the circulatory system: Secondary | ICD-10-CM | POA: Diagnosis not present

## 2019-01-16 DIAGNOSIS — Z79899 Other long term (current) drug therapy: Secondary | ICD-10-CM | POA: Insufficient documentation

## 2019-01-16 DIAGNOSIS — I10 Essential (primary) hypertension: Secondary | ICD-10-CM | POA: Insufficient documentation

## 2019-01-16 DIAGNOSIS — Z7901 Long term (current) use of anticoagulants: Secondary | ICD-10-CM | POA: Diagnosis not present

## 2019-01-16 DIAGNOSIS — E119 Type 2 diabetes mellitus without complications: Secondary | ICD-10-CM | POA: Insufficient documentation

## 2019-01-16 DIAGNOSIS — Z833 Family history of diabetes mellitus: Secondary | ICD-10-CM | POA: Insufficient documentation

## 2019-01-16 DIAGNOSIS — Z6841 Body Mass Index (BMI) 40.0 and over, adult: Secondary | ICD-10-CM | POA: Diagnosis not present

## 2019-01-16 DIAGNOSIS — Z7984 Long term (current) use of oral hypoglycemic drugs: Secondary | ICD-10-CM | POA: Insufficient documentation
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
## 2019-01-17 ENCOUNTER — Ambulatory Visit (INDEPENDENT_AMBULATORY_CARE_PROVIDER_SITE_OTHER): Payer: BC Managed Care – PPO | Admitting: Bariatrics

## 2019-01-17 ENCOUNTER — Other Ambulatory Visit: Payer: Self-pay

## 2019-01-17 ENCOUNTER — Encounter (INDEPENDENT_AMBULATORY_CARE_PROVIDER_SITE_OTHER): Payer: Self-pay | Admitting: Bariatrics

## 2019-01-17 VITALS — BP 130/80 | HR 56 | Temp 98.0°F | Ht 64.0 in | Wt 334.0 lb

## 2019-01-17 DIAGNOSIS — Z6841 Body Mass Index (BMI) 40.0 and over, adult: Secondary | ICD-10-CM

## 2019-01-17 DIAGNOSIS — Z9189 Other specified personal risk factors, not elsewhere classified: Secondary | ICD-10-CM | POA: Diagnosis not present

## 2019-01-17 DIAGNOSIS — I10 Essential (primary) hypertension: Secondary | ICD-10-CM | POA: Diagnosis not present

## 2019-01-17 DIAGNOSIS — E559 Vitamin D deficiency, unspecified: Secondary | ICD-10-CM | POA: Diagnosis not present

## 2019-01-17 MED ORDER — VITAMIN D (ERGOCALCIFEROL) 1.25 MG (50000 UNIT) PO CAPS: 50000.0000 [IU] | ORAL_CAPSULE | ORAL | 0 refills | Status: DC

## 2019-01-17 NOTE — Progress Notes (Signed)
Thanks, [employer]
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
## 2019-01-25 ENCOUNTER — Telehealth: Payer: Self-pay | Admitting: *Deleted

## 2019-01-25 NOTE — Telephone Encounter (Signed)
Routed to [place], [place], to review with Dr. Rayann Heman on Monday, 3/23. Will plan to ask pt to send manual transmission on Monday for rhythm confirmation.

## 2019-01-25 NOTE — Telephone Encounter (Signed)
I can see her Monday, but I am trying to come up with a way to limit her exposure to healthcare settings. How about I set her up for a DCCV on Tuesday, with labs to be done on arrival (she had a normal BMET less than a month ago so should still be OK). Could we please do a loop download on Monday 3/23 to make sure she did not spontaneously convert? [name], any suggestions? Do we need to change her antiarrhythmic? MCr

## 2019-01-25 NOTE — Telephone Encounter (Signed)
Spoke with patient regarding increased AF burden. Available ECGs suggest A-flutter. Pt reports compliance with cardiac meds, including apixaban. Pt has noticed more fatigue with walking and palpitations, especially at night. She reports feeling great while she was in SR s/p DCCV.  She is working from home beginning tomorrow afternoon so she is available for an appointment at any time next week. Advised I will route this message to Dr. Vivia Ewing for review and recommendations. Pt is agreeable to plan and thanked me for my call.

## 2019-01-26 NOTE — Telephone Encounter (Signed)
Advised patient to send a transmission on Monday morning to confirm rhythm. Pt verbalizes understanding and agreement with plan.

## 2019-01-28 NOTE — Telephone Encounter (Signed)
Her BMI is 58.  I think that this is our challenge.  Doubt a cardioversion would be helpful long term as she has recently been in and out of afib. We may have to consider switching flecainide to amiodarone. Lets see what Monday's download shows.  Thompson Grayer MD, [place] 01/28/2019 9:29 PM

## 2019-01-29 MED ORDER — AMIODARONE HCL 200 MG PO TABS: 200.0000 mg | ORAL_TABLET | Freq: Two times a day (BID) | ORAL | 0 refills | Status: DC

## 2019-01-29 NOTE — Telephone Encounter (Signed)
  Patient is waiting to find out if she is still having a cardioversion tomorrow and she needs someone to call and update her on what she is supposed to be doing. She sees Dr Sallyanne Kuster and Dr Rayann Heman was to do cardioversion. I am sending to both offices.

## 2019-01-29 NOTE — Telephone Encounter (Addendum)
Per Dr Rayann Heman:  Stop Flecainide-last dose flecainide 9 AM  01/29/19  In 72 hours start amiodarone 200 mg bid- first dose 9 AM 02/01/19. At[facility]ibrillation Clinic virtual visit in 4 weeks-will staff message Marzetta Board in AFib Clinic.   I reviewed all recommendations, including medication recommendations with patient, she verbalized understanding, thanked me for call.

## 2019-01-29 NOTE — Telephone Encounter (Signed)
I believe that still atrial flutter.  I would wait and see what [name] thinks, but otherwise would stick with the plan for cardioversion tomorrow if he agrees.

## 2019-01-29 NOTE — Telephone Encounter (Signed)
Presenting rhythm as of 01/29/19 at 0612:

## 2019-01-30 ENCOUNTER — Encounter (HOSPITAL_COMMUNITY): Payer: Self-pay

## 2019-01-30 ENCOUNTER — Other Ambulatory Visit: Payer: Self-pay | Admitting: Cardiovascular Disease

## 2019-01-31 ENCOUNTER — Encounter (INDEPENDENT_AMBULATORY_CARE_PROVIDER_SITE_OTHER): Payer: Self-pay

## 2019-02-02 ENCOUNTER — Other Ambulatory Visit: Payer: Self-pay

## 2019-02-02 ENCOUNTER — Ambulatory Visit (INDEPENDENT_AMBULATORY_CARE_PROVIDER_SITE_OTHER): Payer: BC Managed Care – PPO | Admitting: *Deleted

## 2019-02-02 DIAGNOSIS — I480 Paroxysmal atrial fibrillation: Secondary | ICD-10-CM | POA: Diagnosis not present

## 2019-02-03 LAB — CUP PACEART REMOTE DEVICE CHECK
Date Time Interrogation Session: 20200327101209
Implantable Pulse Generator Implant Date: 20180523

## 2019-02-05 NOTE — Progress Notes (Signed)
Carelink Summary Report / Loop Recorder 

## 2019-02-12 ENCOUNTER — Ambulatory Visit (INDEPENDENT_AMBULATORY_CARE_PROVIDER_SITE_OTHER): Payer: BC Managed Care – PPO | Admitting: Bariatrics

## 2019-02-12 ENCOUNTER — Other Ambulatory Visit: Payer: Self-pay

## 2019-02-12 ENCOUNTER — Encounter (INDEPENDENT_AMBULATORY_CARE_PROVIDER_SITE_OTHER): Payer: Self-pay | Admitting: Bariatrics

## 2019-02-12 DIAGNOSIS — I480 Paroxysmal atrial fibrillation: Secondary | ICD-10-CM | POA: Diagnosis not present

## 2019-02-12 DIAGNOSIS — E559 Vitamin D deficiency, unspecified: Secondary | ICD-10-CM

## 2019-02-12 DIAGNOSIS — I10 Essential (primary) hypertension: Secondary | ICD-10-CM

## 2019-02-12 DIAGNOSIS — Z6841 Body Mass Index (BMI) 40.0 and over, adult: Secondary | ICD-10-CM

## 2019-02-12 DIAGNOSIS — E66813 Obesity, class 3: Secondary | ICD-10-CM

## 2019-02-12 MED ORDER — VITAMIN D (ERGOCALCIFEROL) 1.25 MG (50000 UNIT) PO CAPS: 50000.0000 [IU] | ORAL_CAPSULE | ORAL | 0 refills | Status: DC
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
## 2019-02-13 DIAGNOSIS — E559 Vitamin D deficiency, unspecified: Secondary | ICD-10-CM | POA: Insufficient documentation

## 2019-02-22 ENCOUNTER — Encounter (HOSPITAL_COMMUNITY): Payer: Self-pay | Admitting: *Deleted

## 2019-02-23 ENCOUNTER — Telehealth: Payer: Self-pay

## 2019-02-23 NOTE — Telephone Encounter (Signed)
Spoke with patient to remind of missed remote transmission 

## 2019-02-26 ENCOUNTER — Encounter (INDEPENDENT_AMBULATORY_CARE_PROVIDER_SITE_OTHER): Payer: Self-pay | Admitting: Bariatrics

## 2019-02-26 ENCOUNTER — Other Ambulatory Visit: Payer: Self-pay

## 2019-02-26 ENCOUNTER — Ambulatory Visit (INDEPENDENT_AMBULATORY_CARE_PROVIDER_SITE_OTHER): Payer: BC Managed Care – PPO | Admitting: Bariatrics

## 2019-02-26 DIAGNOSIS — E559 Vitamin D deficiency, unspecified: Secondary | ICD-10-CM

## 2019-02-26 DIAGNOSIS — I4891 Unspecified atrial fibrillation: Secondary | ICD-10-CM

## 2019-02-26 DIAGNOSIS — Z6841 Body Mass Index (BMI) 40.0 and over, adult: Secondary | ICD-10-CM

## 2019-02-26 DIAGNOSIS — I10 Essential (primary) hypertension: Secondary | ICD-10-CM

## 2019-02-27 ENCOUNTER — Telehealth: Payer: Self-pay | Admitting: *Deleted

## 2019-02-27 NOTE — Telephone Encounter (Signed)
02/27/19 LMOM @ 10:10 am, RE: follow up appointment.
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
## 2019-03-02 ENCOUNTER — Encounter (HOSPITAL_COMMUNITY): Payer: Self-pay

## 2019-03-02 ENCOUNTER — Other Ambulatory Visit (HOSPITAL_COMMUNITY): Payer: Self-pay | Admitting: *Deleted

## 2019-03-02 ENCOUNTER — Other Ambulatory Visit: Payer: Self-pay

## 2019-03-02 ENCOUNTER — Ambulatory Visit (HOSPITAL_COMMUNITY)
Admission: RE | Admit: 2019-03-02 | Discharge: 2019-03-02 | Disposition: A | Discharge status: Home / Self Care | Payer: BC Managed Care – PPO | Source: Ambulatory Visit | Attending: Physician Assistant | Admitting: Physician Assistant

## 2019-03-02 DIAGNOSIS — E7849 Other hyperlipidemia: Secondary | ICD-10-CM

## 2019-03-02 DIAGNOSIS — I4892 Unspecified atrial flutter: Secondary | ICD-10-CM

## 2019-03-02 MED ORDER — AMIODARONE HCL 200 MG PO TABS: 200.0000 mg | ORAL_TABLET | Freq: Every day | ORAL | 2 refills | Status: DC

## 2019-03-02 MED ORDER — ATORVASTATIN CALCIUM 10 MG PO TABS: 10.0000 mg | ORAL_TABLET | Freq: Every day | ORAL | 0 refills | Status: DC
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
## 2019-03-07 ENCOUNTER — Ambulatory Visit (INDEPENDENT_AMBULATORY_CARE_PROVIDER_SITE_OTHER): Payer: BC Managed Care – PPO | Admitting: *Deleted

## 2019-03-07 ENCOUNTER — Other Ambulatory Visit: Payer: Self-pay

## 2019-03-07 DIAGNOSIS — I480 Paroxysmal atrial fibrillation: Secondary | ICD-10-CM | POA: Diagnosis not present

## 2019-03-07 LAB — CUP PACEART REMOTE DEVICE CHECK
Date Time Interrogation Session: 20200429101221
Implantable Pulse Generator Implant Date: 20180523

## 2019-03-12 ENCOUNTER — Ambulatory Visit (INDEPENDENT_AMBULATORY_CARE_PROVIDER_SITE_OTHER): Payer: BC Managed Care – PPO | Admitting: Bariatrics

## 2019-03-12 ENCOUNTER — Encounter (INDEPENDENT_AMBULATORY_CARE_PROVIDER_SITE_OTHER): Payer: Self-pay | Admitting: Bariatrics

## 2019-03-12 ENCOUNTER — Other Ambulatory Visit: Payer: Self-pay

## 2019-03-12 DIAGNOSIS — E559 Vitamin D deficiency, unspecified: Secondary | ICD-10-CM | POA: Diagnosis not present

## 2019-03-12 DIAGNOSIS — Z6841 Body Mass Index (BMI) 40.0 and over, adult: Secondary | ICD-10-CM

## 2019-03-12 DIAGNOSIS — I10 Essential (primary) hypertension: Secondary | ICD-10-CM

## 2019-03-12 DIAGNOSIS — I4891 Unspecified atrial fibrillation: Secondary | ICD-10-CM

## 2019-03-12 MED ORDER — VITAMIN D (ERGOCALCIFEROL) 1.25 MG (50000 UNIT) PO CAPS: 50000.0000 [IU] | ORAL_CAPSULE | ORAL | 0 refills | Status: DC
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
## 2019-03-16 NOTE — Progress Notes (Signed)
Carelink Summary Report / Loop Recorder 

## 2019-03-23 ENCOUNTER — Encounter (INDEPENDENT_AMBULATORY_CARE_PROVIDER_SITE_OTHER): Payer: Self-pay | Admitting: Bariatrics

## 2019-03-26 ENCOUNTER — Ambulatory Visit (INDEPENDENT_AMBULATORY_CARE_PROVIDER_SITE_OTHER): Payer: Self-pay | Admitting: Bariatrics

## 2019-04-06 ENCOUNTER — Other Ambulatory Visit: Payer: Self-pay | Admitting: Student

## 2019-04-06 DIAGNOSIS — I480 Paroxysmal atrial fibrillation: Secondary | ICD-10-CM

## 2019-04-09 ENCOUNTER — Other Ambulatory Visit: Payer: Self-pay

## 2019-04-09 ENCOUNTER — Ambulatory Visit (INDEPENDENT_AMBULATORY_CARE_PROVIDER_SITE_OTHER): Payer: BC Managed Care – PPO | Admitting: *Deleted

## 2019-04-09 DIAGNOSIS — I480 Paroxysmal atrial fibrillation: Secondary | ICD-10-CM | POA: Diagnosis not present

## 2019-04-09 LAB — CUP PACEART REMOTE DEVICE CHECK
Date Time Interrogation Session: 20200601134106
Implantable Pulse Generator Implant Date: 20180523

## 2019-04-09 MED ORDER — APIXABAN 5 MG PO TABS: 5.0000 mg | ORAL_TABLET | Freq: Two times a day (BID) | ORAL | 3 refills | Status: DC

## 2019-04-17 NOTE — Progress Notes (Signed)
Carelink Summary Report / Loop Recorder 

## 2019-05-14 ENCOUNTER — Ambulatory Visit (INDEPENDENT_AMBULATORY_CARE_PROVIDER_SITE_OTHER): Payer: BC Managed Care – PPO | Admitting: *Deleted

## 2019-05-14 DIAGNOSIS — I480 Paroxysmal atrial fibrillation: Secondary | ICD-10-CM

## 2019-05-14 LAB — CUP PACEART REMOTE DEVICE CHECK
Date Time Interrogation Session: 20200704143909
Implantable Pulse Generator Implant Date: 20180523

## 2019-05-22 NOTE — Progress Notes (Signed)
Carelink Summary Report / Loop Recorder 

## 2019-06-14 ENCOUNTER — Ambulatory Visit (INDEPENDENT_AMBULATORY_CARE_PROVIDER_SITE_OTHER): Payer: BC Managed Care – PPO | Admitting: *Deleted

## 2019-06-14 DIAGNOSIS — I480 Paroxysmal atrial fibrillation: Secondary | ICD-10-CM

## 2019-06-14 LAB — CUP PACEART REMOTE DEVICE CHECK
Date Time Interrogation Session: 20200806140253
Implantable Pulse Generator Implant Date: 20180523

## 2019-06-19 NOTE — Progress Notes (Signed)
Carelink Summary Report / Loop Recorder 

## 2019-07-17 ENCOUNTER — Ambulatory Visit (INDEPENDENT_AMBULATORY_CARE_PROVIDER_SITE_OTHER): Payer: BC Managed Care – PPO | Admitting: *Deleted

## 2019-07-17 DIAGNOSIS — I480 Paroxysmal atrial fibrillation: Secondary | ICD-10-CM

## 2019-07-17 LAB — CUP PACEART REMOTE DEVICE CHECK
Date Time Interrogation Session: 20200908153946
Implantable Pulse Generator Implant Date: 20180523

## 2019-08-01 NOTE — Progress Notes (Signed)
Carelink Summary Report / Loop Recorder 

## 2019-08-20 ENCOUNTER — Ambulatory Visit (INDEPENDENT_AMBULATORY_CARE_PROVIDER_SITE_OTHER): Payer: BC Managed Care – PPO | Admitting: *Deleted

## 2019-08-20 DIAGNOSIS — I4819 Other persistent atrial fibrillation: Secondary | ICD-10-CM | POA: Diagnosis not present

## 2019-08-20 DIAGNOSIS — R55 Syncope and collapse: Secondary | ICD-10-CM

## 2019-08-20 LAB — CUP PACEART REMOTE DEVICE CHECK
Date Time Interrogation Session: 20201012161213
Implantable Pulse Generator Implant Date: 20180523

## 2019-08-29 NOTE — Progress Notes (Signed)
Carelink Summary Report / Loop Recorder 

## 2019-09-22 LAB — CUP PACEART REMOTE DEVICE CHECK
Date Time Interrogation Session: 20201114190107
Implantable Pulse Generator Implant Date: 20180523

## 2019-09-24 ENCOUNTER — Ambulatory Visit (INDEPENDENT_AMBULATORY_CARE_PROVIDER_SITE_OTHER): Payer: BC Managed Care – PPO | Admitting: *Deleted

## 2019-09-24 DIAGNOSIS — I4819 Other persistent atrial fibrillation: Secondary | ICD-10-CM | POA: Diagnosis not present

## 2019-09-24 DIAGNOSIS — R55 Syncope and collapse: Secondary | ICD-10-CM

## 2019-10-03 ENCOUNTER — Other Ambulatory Visit: Payer: Self-pay | Admitting: Student

## 2019-10-03 DIAGNOSIS — I480 Paroxysmal atrial fibrillation: Secondary | ICD-10-CM

## 2019-10-19 NOTE — Progress Notes (Signed)
Carelink Summary Report / Loop Recorder 

## 2019-10-25 ENCOUNTER — Ambulatory Visit (INDEPENDENT_AMBULATORY_CARE_PROVIDER_SITE_OTHER): Payer: BC Managed Care – PPO | Admitting: *Deleted

## 2019-10-25 DIAGNOSIS — R55 Syncope and collapse: Secondary | ICD-10-CM | POA: Diagnosis not present

## 2019-10-25 LAB — CUP PACEART REMOTE DEVICE CHECK
Date Time Interrogation Session: 20201217140624
Implantable Pulse Generator Implant Date: 20180523

## 2019-11-24 NOTE — Progress Notes (Signed)
ILR remote 

## 2019-11-27 ENCOUNTER — Ambulatory Visit (INDEPENDENT_AMBULATORY_CARE_PROVIDER_SITE_OTHER): Payer: BC Managed Care – PPO | Admitting: *Deleted

## 2019-11-27 DIAGNOSIS — R55 Syncope and collapse: Secondary | ICD-10-CM | POA: Diagnosis not present

## 2019-11-27 LAB — CUP PACEART REMOTE DEVICE CHECK
Date Time Interrogation Session: 20210119141137
Implantable Pulse Generator Implant Date: 20180523

## 2019-12-11 ENCOUNTER — Ambulatory Visit (HOSPITAL_COMMUNITY)
Admission: RE | Admit: 2019-12-11 | Discharge: 2019-12-11 | Disposition: A | Discharge status: Home / Self Care | Payer: BC Managed Care – PPO | Source: Ambulatory Visit | Attending: Physician Assistant | Admitting: Physician Assistant

## 2019-12-11 ENCOUNTER — Encounter (HOSPITAL_COMMUNITY): Payer: Self-pay | Admitting: Physician Assistant

## 2019-12-11 ENCOUNTER — Other Ambulatory Visit: Payer: Self-pay

## 2019-12-11 VITALS — BP 136/78 | HR 66 | Ht 64.0 in | Wt 353.0 lb

## 2019-12-11 DIAGNOSIS — E669 Obesity, unspecified: Secondary | ICD-10-CM | POA: Diagnosis not present

## 2019-12-11 DIAGNOSIS — I484 Atypical atrial flutter: Secondary | ICD-10-CM | POA: Diagnosis not present

## 2019-12-11 DIAGNOSIS — E119 Type 2 diabetes mellitus without complications: Secondary | ICD-10-CM | POA: Diagnosis not present

## 2019-12-11 DIAGNOSIS — I10 Essential (primary) hypertension: Secondary | ICD-10-CM | POA: Insufficient documentation

## 2019-12-11 DIAGNOSIS — Z7901 Long term (current) use of anticoagulants: Secondary | ICD-10-CM | POA: Insufficient documentation

## 2019-12-11 DIAGNOSIS — I4819 Other persistent atrial fibrillation: Secondary | ICD-10-CM | POA: Diagnosis present

## 2019-12-11 DIAGNOSIS — Z79899 Other long term (current) drug therapy: Secondary | ICD-10-CM | POA: Diagnosis not present

## 2019-12-11 DIAGNOSIS — Z6841 Body Mass Index (BMI) 40.0 and over, adult: Secondary | ICD-10-CM | POA: Insufficient documentation

## 2019-12-11 DIAGNOSIS — Z7984 Long term (current) use of oral hypoglycemic drugs: Secondary | ICD-10-CM | POA: Insufficient documentation

## 2019-12-11 DIAGNOSIS — D6869 Other thrombophilia: Secondary | ICD-10-CM | POA: Diagnosis not present
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
## 2019-12-11 NOTE — Progress Notes (Signed)
Thanks, Ricky. Would like to hear Dr. Jackalyn Lombard opinion re: cardioversion as well, since she has been in flutter for months.

## 2019-12-16 ENCOUNTER — Other Ambulatory Visit (HOSPITAL_COMMUNITY): Payer: Self-pay | Admitting: Physician Assistant

## 2019-12-18 ENCOUNTER — Encounter (HOSPITAL_COMMUNITY): Payer: Self-pay

## 2019-12-19 ENCOUNTER — Other Ambulatory Visit (HOSPITAL_COMMUNITY): Payer: Self-pay | Admitting: *Deleted

## 2019-12-25 ENCOUNTER — Other Ambulatory Visit (HOSPITAL_COMMUNITY)
Admission: RE | Admit: 2019-12-25 | Discharge: 2019-12-25 | Disposition: A | Discharge status: Home / Self Care | Payer: BC Managed Care – PPO | Source: Ambulatory Visit | Attending: Cardiovascular Disease | Admitting: Cardiovascular Disease

## 2019-12-25 DIAGNOSIS — Z01812 Encounter for preprocedural laboratory examination: Secondary | ICD-10-CM | POA: Diagnosis not present

## 2019-12-25 DIAGNOSIS — Z20822 Contact with and (suspected) exposure to covid-19: Secondary | ICD-10-CM | POA: Insufficient documentation

## 2019-12-25 LAB — SARS CORONAVIRUS 2 (TAT 6-24 HRS): SARS Coronavirus 2: NEGATIVE

## 2019-12-28 ENCOUNTER — Encounter (HOSPITAL_COMMUNITY)
Admission: RE | Disposition: A | Discharge status: Home / Self Care | Payer: Self-pay | Source: Home / Self Care | Attending: Cardiovascular Disease

## 2019-12-28 ENCOUNTER — Ambulatory Visit (HOSPITAL_COMMUNITY): Payer: BC Managed Care – PPO | Admitting: Anesthesiology

## 2019-12-28 ENCOUNTER — Other Ambulatory Visit (HOSPITAL_COMMUNITY): Payer: BC Managed Care – PPO | Admitting: Physician Assistant

## 2019-12-28 ENCOUNTER — Ambulatory Visit (HOSPITAL_COMMUNITY)
Admission: RE | Admit: 2019-12-28 | Discharge: 2019-12-28 | Disposition: A | Discharge status: Home / Self Care | Payer: BC Managed Care – PPO | Attending: Cardiovascular Disease | Admitting: Cardiovascular Disease

## 2019-12-28 ENCOUNTER — Other Ambulatory Visit: Payer: Self-pay

## 2019-12-28 DIAGNOSIS — F419 Anxiety disorder, unspecified: Secondary | ICD-10-CM | POA: Insufficient documentation

## 2019-12-28 DIAGNOSIS — I484 Atypical atrial flutter: Secondary | ICD-10-CM | POA: Diagnosis not present

## 2019-12-28 DIAGNOSIS — I10 Essential (primary) hypertension: Secondary | ICD-10-CM | POA: Diagnosis not present

## 2019-12-28 DIAGNOSIS — Z7984 Long term (current) use of oral hypoglycemic drugs: Secondary | ICD-10-CM | POA: Insufficient documentation

## 2019-12-28 DIAGNOSIS — I4819 Other persistent atrial fibrillation: Secondary | ICD-10-CM

## 2019-12-28 DIAGNOSIS — F329 Major depressive disorder, single episode, unspecified: Secondary | ICD-10-CM | POA: Diagnosis not present

## 2019-12-28 DIAGNOSIS — Z6841 Body Mass Index (BMI) 40.0 and over, adult: Secondary | ICD-10-CM | POA: Insufficient documentation

## 2019-12-28 DIAGNOSIS — E119 Type 2 diabetes mellitus without complications: Secondary | ICD-10-CM | POA: Insufficient documentation

## 2019-12-28 DIAGNOSIS — Z79899 Other long term (current) drug therapy: Secondary | ICD-10-CM | POA: Insufficient documentation

## 2019-12-28 DIAGNOSIS — Z7901 Long term (current) use of anticoagulants: Secondary | ICD-10-CM | POA: Insufficient documentation

## 2019-12-28 HISTORY — PX: CARDIOVERSION: SHX1299

## 2019-12-28 LAB — POCT I-STAT, CHEM 8
BUN: 13 mg/dL (ref 8–23)
Calcium, Ion: 1.16 mmol/L (ref 1.15–1.40)
Chloride: 101 mmol/L (ref 98–111)
Creatinine, Ser: 0.9 mg/dL (ref 0.44–1.00)
Glucose, Bld: 98 mg/dL (ref 70–99)
HCT: 46 % (ref 36.0–46.0)
Hemoglobin: 15.6 g/dL — ABNORMAL HIGH (ref 12.0–15.0)
Potassium: 4 mmol/L (ref 3.5–5.1)
Sodium: 141 mmol/L (ref 135–145)
TCO2: 31 mmol/L (ref 22–32)

## 2019-12-28 SURGERY — CARDIOVERSION
Anesthesia: General

## 2019-12-28 MED ORDER — LIDOCAINE 2% (20 MG/ML) 5 ML SYRINGE
INTRAMUSCULAR | Status: DC | PRN
  Administered 2019-12-28: 40 mg via INTRAVENOUS

## 2019-12-28 MED ORDER — PROPOFOL 10 MG/ML IV BOLUS
INTRAVENOUS | Status: DC | PRN
  Administered 2019-12-28: 70 mg via INTRAVENOUS

## 2019-12-28 MED ORDER — SODIUM CHLORIDE 0.9 % IV SOLN
INTRAVENOUS | Status: AC | PRN
  Administered 2019-12-28: 500 mL via INTRAVENOUS

## 2019-12-28 NOTE — Op Note (Signed)
Procedure: Electrical Cardioversion Indications:  Atrial Fibrillation  Procedure Details:  Consent: Risks of procedure as well as the alternatives and risks of each were explained to the (patient/caregiver).  Consent for procedure obtained.  Time Out: Verified patient identification, verified procedure, site/side was marked, verified correct patient position, special equipment/implants available, medications/allergies/relevent history reviewed, required imaging and test results available.  Performed  Patient placed on cardiac monitor, pulse oximetry, supplemental oxygen as necessary.  Sedation given: propofol 80 mg IV, Dr. Marcie Bal Pacer pads placed anterior and posterior chest.  Cardioverted 1 time(s).  Cardioversion with synchronized biphasic 200J shock.  Evaluation: Findings: Post procedure EKG shows: sinus bradycardia 50 bpm Complications: none Patient did tolerate procedure well.  Time Spent Directly with the Patient:  30 minutes   Vicki Wheeler 12/28/2019, 9:37 AM

## 2019-12-28 NOTE — Anesthesia Preprocedure Evaluation (Signed)
Anesthesia Evaluation  Patient identified by MRN, date of birth, ID band Patient awake    Reviewed: Allergy & Precautions, H&P , NPO status , Patient's Chart, lab work & pertinent test results  Airway Mallampati: II   Neck ROM: full    Dental   Pulmonary neg pulmonary ROS,    breath sounds clear to auscultation       Cardiovascular hypertension, + dysrhythmias Atrial Fibrillation  Rhythm:irregular Rate:Normal     Neuro/Psych  Headaches, PSYCHIATRIC DISORDERS Anxiety Depression    GI/Hepatic   Endo/Other  diabetes, Type 2Morbid obesity  Renal/GU      Musculoskeletal   Abdominal   Peds  Hematology   Anesthesia Other Findings   Reproductive/Obstetrics                             Anesthesia Physical Anesthesia Plan  ASA: III  Anesthesia Plan: General   Post-op Pain Management:    Induction: Intravenous  PONV Risk Score and Plan: 3 and Propofol infusion and Treatment may vary due to age or medical condition  Airway Management Planned: Mask  Additional Equipment:   Intra-op Plan:   Post-operative Plan:   Informed Consent: I have reviewed the patients History and Physical, chart, labs and discussed the procedure including the risks, benefits and alternatives for the proposed anesthesia with the patient or authorized representative who has indicated his/her understanding and acceptance.       Plan Discussed with: CRNA and Anesthesiologist  Anesthesia Plan Comments:         Anesthesia Quick Evaluation

## 2019-12-28 NOTE — Discharge Instructions (Signed)
Electrical Cardioversion Electrical cardioversion is the delivery of a jolt of electricity to restore a normal rhythm to the heart. A rhythm that is too fast or is not regular keeps the heart from pumping well. In this procedure, sticky patches or metal paddles are placed on the chest to deliver electricity to the heart from a device. This procedure may be done in an emergency if:  There is low or no blood pressure as a result of the heart rhythm.  Normal rhythm must be restored as fast as possible to protect the brain and heart from further damage.  It may save a life. This may also be a scheduled procedure for irregular or fast heart rhythms that are not immediately life-threatening. Tell a health care provider about:  Any allergies you have.  All medicines you are taking, including vitamins, herbs, eye drops, creams, and over-the-counter medicines.  Any problems you or family members have had with anesthetic medicines.  Any blood disorders you have.  Any surgeries you have had.  Any medical conditions you have.  Whether you are pregnant or may be pregnant. What are the risks? Generally, this is a safe procedure. However, problems may occur, including:  Allergic reactions to medicines.  A blood clot that breaks free and travels to other parts of your body.  The possible return of an abnormal heart rhythm within hours or days after the procedure.  Your heart stopping (cardiac arrest). This is rare. What happens before the procedure? Medicines  Your health care provider may have you start taking: ? Blood-thinning medicines (anticoagulants) so your blood does not clot as easily. ? Medicines to help stabilize your heart rate and rhythm.  Ask your health care provider about: ? Changing or stopping your regular medicines. This is especially important if you are taking diabetes medicines or blood thinners. ? Taking medicines such as aspirin and ibuprofen. These medicines can  thin your blood. Do not take these medicines unless your health care provider tells you to take them. ? Taking over-the-counter medicines, vitamins, herbs, and supplements. General instructions  Follow instructions from your health care provider about eating or drinking restrictions.  Plan to have someone take you home from the hospital or clinic.  If you will be going home right after the procedure, plan to have someone with you for 24 hours.  Ask your health care provider what steps will be taken to help prevent infection. These may include washing your skin with a germ-killing soap. What happens during the procedure?   An IV will be inserted into one of your veins.  Sticky patches (electrodes) or metal paddles may be placed on your chest.  You will be given a medicine to help you relax (sedative).  An electrical shock will be delivered. The procedure may vary among health care providers and hospitals. What can I expect after the procedure?  Your blood pressure, heart rate, breathing rate, and blood oxygen level will be monitored until you leave the hospital or clinic.  Your heart rhythm will be watched to make sure it does not change.  You may have some redness on the skin where the shocks were given. Follow these instructions at home:  Do not drive for 24 hours if you were given a sedative during your procedure.  Take over-the-counter and prescription medicines only as told by your health care provider.  Ask your health care provider how to check your pulse. Check it often.  Rest for 48 hours after the procedure or   as told by your health care provider.  Avoid or limit your caffeine use as told by your health care provider.  Keep all follow-up visits as told by your health care provider. This is important. Contact a health care provider if:  You feel like your heart is beating too quickly or your pulse is not regular.  You have a serious muscle cramp that does not go  away. Get help right away if:  You have discomfort in your chest.  You are dizzy or you feel faint.  You have trouble breathing or you are short of breath.  Your speech is slurred.  You have trouble moving an arm or leg on one side of your body.  Your fingers or toes turn cold or blue. Summary  Electrical cardioversion is the delivery of a jolt of electricity to restore a normal rhythm to the heart.  This procedure may be done right away in an emergency or may be a scheduled procedure if the condition is not an emergency.  Generally, this is a safe procedure.  After the procedure, check your pulse often as told by your health care provider. This information is not intended to replace advice given to you by your health care provider. Make sure you discuss any questions you have with your health care provider. Document Revised: 05/28/2019 Document Reviewed: 05/28/2019 Elsevier Patient Education  2020 Elsevier Inc.  

## 2019-12-28 NOTE — Transfer of Care (Signed)
Immediate Anesthesia Transfer of Care Note  Patient: Vicki Wheeler  Procedure(s) Performed: CARDIOVERSION (N/A )  Patient Location: Endoscopy Unit  Anesthesia Type:General  Level of Consciousness: drowsy  Airway & Oxygen Therapy: Patient Spontanous Breathing  Post-op Assessment: Report given to RN and Post -op Vital signs reviewed and stable  Post vital signs: Reviewed and stable  Last Vitals:  Vitals Value Taken Time  BP    Temp    Pulse    Resp    SpO2      Last Pain:  Vitals:   12/28/19 0916  TempSrc: Temporal  PainSc: 0-No pain         Complications: No apparent anesthesia complications

## 2019-12-28 NOTE — Anesthesia Postprocedure Evaluation (Signed)
Anesthesia Post Note  Patient: Dakota Africa  Procedure(s) Performed: CARDIOVERSION (N/A )     Patient location during evaluation: Endoscopy Anesthesia Type: General Level of consciousness: awake and alert Pain management: pain level controlled Vital Signs Assessment: post-procedure vital signs reviewed and stable Respiratory status: spontaneous breathing, nonlabored ventilation, respiratory function stable and patient connected to nasal cannula oxygen Cardiovascular status: blood pressure returned to baseline and stable Postop Assessment: no apparent nausea or vomiting Anesthetic complications: no    Last Vitals:  Vitals:   12/28/19 1010 12/28/19 1015  BP:  (!) 131/94  Pulse: (!) 43 (!) 48  Resp: 16 18  Temp:    SpO2: 100% 100%    Last Pain:  Vitals:   12/28/19 1015  TempSrc:   PainSc: 0-No pain                 Haizley Cannella S

## 2019-12-28 NOTE — Interval H&P Note (Signed)
History and Physical Interval Note:  12/28/2019 8:58 AM  Vicki Wheeler  has presented today for surgery, with the diagnosis of AFIB.  The various methods of treatment have been discussed with the patient and family. After consideration of risks, benefits and other options for treatment, the patient has consented to  Procedure(s): CARDIOVERSION (N/A) as a surgical intervention.  The patient's history has been reviewed, patient examined, no change in status, stable for surgery.  I have reviewed the patient's chart and labs.  Questions were answered to the patient's satisfaction.     Jimmey Hengel

## 2019-12-31 ENCOUNTER — Ambulatory Visit (INDEPENDENT_AMBULATORY_CARE_PROVIDER_SITE_OTHER): Payer: BC Managed Care – PPO | Admitting: *Deleted

## 2019-12-31 DIAGNOSIS — R55 Syncope and collapse: Secondary | ICD-10-CM | POA: Diagnosis not present

## 2019-12-31 LAB — CUP PACEART REMOTE DEVICE CHECK
Date Time Interrogation Session: 20210222001942
Implantable Pulse Generator Implant Date: 20180523

## 2019-12-31 NOTE — Progress Notes (Signed)
ILR Remote 

## 2020-01-03 ENCOUNTER — Other Ambulatory Visit: Payer: Self-pay | Admitting: *Deleted

## 2020-01-03 MED ORDER — AMLODIPINE BESYLATE 5 MG PO TABS: 5.0000 mg | ORAL_TABLET | Freq: Every day | ORAL | 3 refills | Status: DC

## 2020-01-04 ENCOUNTER — Ambulatory Visit (HOSPITAL_COMMUNITY)
Admission: RE | Admit: 2020-01-04 | Discharge: 2020-01-04 | Disposition: A | Discharge status: Home / Self Care | Payer: BC Managed Care – PPO | Source: Ambulatory Visit | Attending: Physician Assistant | Admitting: Physician Assistant

## 2020-01-04 ENCOUNTER — Other Ambulatory Visit: Payer: Self-pay

## 2020-01-04 ENCOUNTER — Telehealth (HOSPITAL_COMMUNITY): Payer: Self-pay

## 2020-01-04 VITALS — BP 180/86 | HR 56 | Ht 64.0 in | Wt 353.2 lb

## 2020-01-04 DIAGNOSIS — I484 Atypical atrial flutter: Secondary | ICD-10-CM | POA: Diagnosis not present

## 2020-01-04 DIAGNOSIS — Z6841 Body Mass Index (BMI) 40.0 and over, adult: Secondary | ICD-10-CM | POA: Diagnosis not present

## 2020-01-04 DIAGNOSIS — I4892 Unspecified atrial flutter: Secondary | ICD-10-CM | POA: Insufficient documentation

## 2020-01-04 DIAGNOSIS — D6869 Other thrombophilia: Secondary | ICD-10-CM | POA: Diagnosis not present

## 2020-01-04 DIAGNOSIS — E669 Obesity, unspecified: Secondary | ICD-10-CM | POA: Insufficient documentation

## 2020-01-04 DIAGNOSIS — I10 Essential (primary) hypertension: Secondary | ICD-10-CM | POA: Diagnosis not present

## 2020-01-04 DIAGNOSIS — Z79899 Other long term (current) drug therapy: Secondary | ICD-10-CM | POA: Diagnosis not present

## 2020-01-04 DIAGNOSIS — Z7901 Long term (current) use of anticoagulants: Secondary | ICD-10-CM | POA: Diagnosis not present

## 2020-01-04 DIAGNOSIS — E118 Type 2 diabetes mellitus with unspecified complications: Secondary | ICD-10-CM | POA: Insufficient documentation

## 2020-01-04 DIAGNOSIS — I4819 Other persistent atrial fibrillation: Secondary | ICD-10-CM | POA: Diagnosis not present

## 2020-01-04 DIAGNOSIS — Z7984 Long term (current) use of oral hypoglycemic drugs: Secondary | ICD-10-CM | POA: Insufficient documentation

## 2020-01-04 LAB — COMPREHENSIVE METABOLIC PANEL
ALT: 16 U/L (ref 0–44)
AST: 17 U/L (ref 15–41)
Albumin: 3.4 g/dL — ABNORMAL LOW (ref 3.5–5.0)
Alkaline Phosphatase: 65 U/L (ref 38–126)
Anion gap: 11 (ref 5–15)
BUN: 8 mg/dL (ref 8–23)
CO2: 29 mmol/L (ref 22–32)
Calcium: 9.1 mg/dL (ref 8.9–10.3)
Chloride: 100 mmol/L (ref 98–111)
Creatinine, Ser: 0.99 mg/dL (ref 0.44–1.00)
GFR calc Af Amer: >60 mL/min (ref >60)
GFR calc non Af Amer: >60 mL/min (ref >60)
Glucose, Bld: 127 mg/dL — ABNORMAL HIGH (ref 70–99)
Potassium: 4.4 mmol/L (ref 3.5–5.1)
Sodium: 140 mmol/L (ref 135–145)
Total Bilirubin: 0.8 mg/dL (ref 0.3–1.2)
Total Protein: 7.3 g/dL (ref 6.5–8.1)

## 2020-01-04 LAB — T4, FREE: Free T4: 0.93 ng/dL (ref 0.61–1.12)

## 2020-01-04 LAB — TSH: TSH: 5.113 u[IU]/mL — ABNORMAL HIGH (ref 0.350–4.500)

## 2020-01-04 NOTE — Addendum Note (Signed)
Encounter addended by: Juluis Mire, RN on: 01/04/2020 12:05 PM  Actions taken: Order list changed
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
## 2020-01-04 NOTE — Progress Notes (Signed)
Thanks, Ricky 

## 2020-01-04 NOTE — Telephone Encounter (Signed)
Patient was notified regarding her lab results. Per Audry Pili Fenton-PA her thyroid level is abnormal and sometimes Amiodarone can affect the thyroid. She was told to contact her pcp to make sure they received the lab results and she should ask when she should come back in for a follow up. Patient verbalized understanding.

## 2020-01-12 ENCOUNTER — Ambulatory Visit: Payer: BC Managed Care – PPO | Attending: Internal Medicine

## 2020-01-12 ENCOUNTER — Other Ambulatory Visit: Payer: Self-pay

## 2020-01-12 DIAGNOSIS — Z23 Encounter for immunization: Secondary | ICD-10-CM | POA: Insufficient documentation

## 2020-01-12 NOTE — Progress Notes (Signed)
   Covid-19 Vaccination Clinic  Name:  Vicki Wheeler    MRN: [id number] DOB: Oct 11, 1956  01/12/2020  Vicki Wheeler was observed post Covid-19 immunization for 30 minutes based on pre-vaccination screening without incident. She was provided with Vaccine Information Sheet and instruction to access the V-Safe system.   Vicki Wheeler was instructed to call 911 with any severe reactions post vaccine: Marland Kitchen Difficulty breathing  . Swelling of face and throat  . A fast heartbeat  . A bad rash all over body  . Dizziness and weakness   Immunizations Administered    Name Date Dose VIS Date Route   Moderna COVID-19 Vaccine 01/12/2020  3:36 PM 0.5 mL 10/09/2019 Intramuscular   Manufacturer: Moderna   Lot: [id number]   [id number]:[id number]

## 2020-01-31 ENCOUNTER — Ambulatory Visit (INDEPENDENT_AMBULATORY_CARE_PROVIDER_SITE_OTHER): Payer: BC Managed Care – PPO | Admitting: *Deleted

## 2020-01-31 DIAGNOSIS — R55 Syncope and collapse: Secondary | ICD-10-CM | POA: Diagnosis not present

## 2020-01-31 LAB — CUP PACEART REMOTE DEVICE CHECK
Date Time Interrogation Session: 20210325012146
Implantable Pulse Generator Implant Date: 20180523

## 2020-01-31 MED ORDER — AMLODIPINE BESYLATE 5 MG PO TABS: 7.5000 mg | ORAL_TABLET | Freq: Every day | ORAL | 3 refills | Status: DC

## 2020-01-31 NOTE — Progress Notes (Signed)
ILR Remote 

## 2020-02-09 ENCOUNTER — Ambulatory Visit: Payer: BC Managed Care – PPO | Attending: Internal Medicine

## 2020-02-09 DIAGNOSIS — Z23 Encounter for immunization: Secondary | ICD-10-CM

## 2020-02-09 NOTE — Progress Notes (Signed)
   Covid-19 Vaccination Clinic  Name:  Vicki Wheeler    MRN: [id number] DOB: August 08, 1956  02/09/2020  Vicki Wheeler was observed post Covid-19 immunization for 30 minutes based on pre-vaccination screening without incident. She was provided with Vaccine Information Sheet and instruction to access the V-Safe system.   Vicki Wheeler was instructed to call 911 with any severe reactions post vaccine: Marland Kitchen Difficulty breathing  . Swelling of face and throat  . A fast heartbeat  . A bad rash all over body  . Dizziness and weakness   Immunizations Administered    Name Date Dose VIS Date Route   Moderna COVID-19 Vaccine 02/09/2020  8:26 AM 0.5 mL 10/09/2019 Intramuscular   Manufacturer: Levan Hurst   LotSS:[id number]   [id number]:[id number]

## 2020-03-03 ENCOUNTER — Other Ambulatory Visit: Payer: Self-pay

## 2020-03-03 ENCOUNTER — Ambulatory Visit (INDEPENDENT_AMBULATORY_CARE_PROVIDER_SITE_OTHER): Payer: BC Managed Care – PPO | Admitting: *Deleted

## 2020-03-03 DIAGNOSIS — R55 Syncope and collapse: Secondary | ICD-10-CM | POA: Diagnosis not present

## 2020-03-03 LAB — CUP PACEART REMOTE DEVICE CHECK
Date Time Interrogation Session: 20210425012709
Implantable Pulse Generator Implant Date: 20180523

## 2020-03-03 NOTE — Progress Notes (Signed)
ILR Remote 

## 2020-03-17 ENCOUNTER — Telehealth: Payer: Self-pay

## 2020-03-17 NOTE — Telephone Encounter (Signed)
Spoke with patient to inform of disconnected monitor. °

## 2020-03-29 ENCOUNTER — Other Ambulatory Visit: Payer: Self-pay | Admitting: Student

## 2020-03-29 DIAGNOSIS — I480 Paroxysmal atrial fibrillation: Secondary | ICD-10-CM

## 2020-03-31 ENCOUNTER — Other Ambulatory Visit: Payer: Self-pay | Admitting: Pharmacist

## 2020-03-31 MED ORDER — APIXABAN 5 MG PO TABS: 5.0000 mg | ORAL_TABLET | Freq: Two times a day (BID) | ORAL | 1 refills | Status: DC

## 2020-04-02 ENCOUNTER — Other Ambulatory Visit: Payer: Self-pay

## 2020-04-02 ENCOUNTER — Ambulatory Visit (INDEPENDENT_AMBULATORY_CARE_PROVIDER_SITE_OTHER): Payer: BC Managed Care – PPO | Admitting: *Deleted

## 2020-04-02 DIAGNOSIS — I4819 Other persistent atrial fibrillation: Secondary | ICD-10-CM | POA: Diagnosis not present

## 2020-04-02 DIAGNOSIS — I480 Paroxysmal atrial fibrillation: Secondary | ICD-10-CM

## 2020-04-02 LAB — CUP PACEART REMOTE DEVICE CHECK
Date Time Interrogation Session: 20210526014708
Implantable Pulse Generator Implant Date: 20180523

## 2020-04-02 MED ORDER — ATENOLOL 100 MG PO TABS: 100.0000 mg | ORAL_TABLET | Freq: Two times a day (BID) | ORAL | 0 refills | Status: DC

## 2020-04-03 NOTE — Progress Notes (Signed)
Carelink Summary Report / Loop Recorder 

## 2020-05-05 ENCOUNTER — Ambulatory Visit (INDEPENDENT_AMBULATORY_CARE_PROVIDER_SITE_OTHER): Payer: BC Managed Care – PPO | Admitting: *Deleted

## 2020-05-05 DIAGNOSIS — I484 Atypical atrial flutter: Secondary | ICD-10-CM | POA: Diagnosis not present

## 2020-05-05 LAB — CUP PACEART REMOTE DEVICE CHECK
Date Time Interrogation Session: 20210628025352
Implantable Pulse Generator Implant Date: 20180523

## 2020-05-06 NOTE — Progress Notes (Signed)
Carelink Summary Report / Loop Recorder 

## 2020-06-09 ENCOUNTER — Ambulatory Visit (INDEPENDENT_AMBULATORY_CARE_PROVIDER_SITE_OTHER): Payer: BC Managed Care – PPO | Admitting: *Deleted

## 2020-06-09 DIAGNOSIS — I4819 Other persistent atrial fibrillation: Secondary | ICD-10-CM

## 2020-06-09 LAB — CUP PACEART REMOTE DEVICE CHECK
Date Time Interrogation Session: 20210731030450
Implantable Pulse Generator Implant Date: 20180523

## 2020-06-11 NOTE — Progress Notes (Signed)
Carelink Summary Report / Loop Recorder 

## 2020-06-16 ENCOUNTER — Other Ambulatory Visit: Payer: Self-pay | Admitting: Student

## 2020-06-16 DIAGNOSIS — I480 Paroxysmal atrial fibrillation: Secondary | ICD-10-CM

## 2020-06-16 NOTE — Telephone Encounter (Signed)
This is Dr. Croitoru's pt 

## 2020-06-18 ENCOUNTER — Other Ambulatory Visit: Payer: Self-pay

## 2020-06-18 DIAGNOSIS — I480 Paroxysmal atrial fibrillation: Secondary | ICD-10-CM

## 2020-06-18 MED ORDER — ATENOLOL 100 MG PO TABS: 100.0000 mg | ORAL_TABLET | Freq: Two times a day (BID) | ORAL | 0 refills | Status: DC

## 2020-06-18 MED ORDER — AMLODIPINE BESYLATE 5 MG PO TABS: 7.5000 mg | ORAL_TABLET | Freq: Every day | ORAL | 1 refills | Status: DC
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
## 2020-07-04 ENCOUNTER — Other Ambulatory Visit: Payer: Self-pay

## 2020-07-04 ENCOUNTER — Telehealth: Payer: Self-pay | Admitting: Cardiovascular Disease

## 2020-07-04 ENCOUNTER — Ambulatory Visit (HOSPITAL_COMMUNITY)
Admission: RE | Admit: 2020-07-04 | Discharge: 2020-07-04 | Disposition: A | Discharge status: Home / Self Care | Payer: BC Managed Care – PPO | Source: Ambulatory Visit | Attending: Physician Assistant | Admitting: Physician Assistant

## 2020-07-04 ENCOUNTER — Encounter (HOSPITAL_COMMUNITY): Payer: Self-pay | Admitting: Physician Assistant

## 2020-07-04 VITALS — BP 150/80 | HR 50 | Ht 64.0 in | Wt 351.2 lb

## 2020-07-04 DIAGNOSIS — F418 Other specified anxiety disorders: Secondary | ICD-10-CM | POA: Insufficient documentation

## 2020-07-04 DIAGNOSIS — Z79899 Other long term (current) drug therapy: Secondary | ICD-10-CM | POA: Insufficient documentation

## 2020-07-04 DIAGNOSIS — I10 Essential (primary) hypertension: Secondary | ICD-10-CM | POA: Insufficient documentation

## 2020-07-04 DIAGNOSIS — I484 Atypical atrial flutter: Secondary | ICD-10-CM | POA: Insufficient documentation

## 2020-07-04 DIAGNOSIS — Z95818 Presence of other cardiac implants and grafts: Secondary | ICD-10-CM | POA: Diagnosis not present

## 2020-07-04 DIAGNOSIS — Z6841 Body Mass Index (BMI) 40.0 and over, adult: Secondary | ICD-10-CM | POA: Insufficient documentation

## 2020-07-04 DIAGNOSIS — Z79890 Hormone replacement therapy (postmenopausal): Secondary | ICD-10-CM | POA: Insufficient documentation

## 2020-07-04 DIAGNOSIS — Z8249 Family history of ischemic heart disease and other diseases of the circulatory system: Secondary | ICD-10-CM | POA: Insufficient documentation

## 2020-07-04 DIAGNOSIS — D6869 Other thrombophilia: Secondary | ICD-10-CM

## 2020-07-04 DIAGNOSIS — E119 Type 2 diabetes mellitus without complications: Secondary | ICD-10-CM | POA: Insufficient documentation

## 2020-07-04 DIAGNOSIS — Z823 Family history of stroke: Secondary | ICD-10-CM | POA: Diagnosis not present

## 2020-07-04 DIAGNOSIS — I4819 Other persistent atrial fibrillation: Secondary | ICD-10-CM | POA: Insufficient documentation

## 2020-07-04 DIAGNOSIS — Z7984 Long term (current) use of oral hypoglycemic drugs: Secondary | ICD-10-CM | POA: Diagnosis not present

## 2020-07-04 DIAGNOSIS — Z7901 Long term (current) use of anticoagulants: Secondary | ICD-10-CM | POA: Diagnosis not present

## 2020-07-04 NOTE — Telephone Encounter (Signed)
Left message to call and schedule 3 month f/u with Dr. Sallyanne Kuster

## 2020-07-04 NOTE — Progress Notes (Signed)
Thanks, Vicki Wheeler 

## 2020-07-13 LAB — CUP PACEART REMOTE DEVICE CHECK
Date Time Interrogation Session: 20210902030628
Implantable Pulse Generator Implant Date: 20180523

## 2020-07-15 ENCOUNTER — Ambulatory Visit (INDEPENDENT_AMBULATORY_CARE_PROVIDER_SITE_OTHER): Payer: BC Managed Care – PPO | Admitting: *Deleted

## 2020-07-15 DIAGNOSIS — I4819 Other persistent atrial fibrillation: Secondary | ICD-10-CM

## 2020-07-16 NOTE — Progress Notes (Signed)
Carelink Summary Report / Loop Recorder 

## 2020-07-17 ENCOUNTER — Other Ambulatory Visit: Payer: Self-pay | Admitting: Cardiovascular Disease

## 2020-07-17 DIAGNOSIS — I480 Paroxysmal atrial fibrillation: Secondary | ICD-10-CM

## 2020-07-28 ENCOUNTER — Other Ambulatory Visit (HOSPITAL_COMMUNITY): Payer: Self-pay | Admitting: *Deleted

## 2020-07-28 DIAGNOSIS — I480 Paroxysmal atrial fibrillation: Secondary | ICD-10-CM

## 2020-07-28 MED ORDER — ATENOLOL 100 MG PO TABS: 100.0000 mg | ORAL_TABLET | Freq: Two times a day (BID) | ORAL | 6 refills | Status: DC

## 2020-08-22 ENCOUNTER — Other Ambulatory Visit (HOSPITAL_COMMUNITY): Payer: Self-pay | Admitting: Physician Assistant

## 2020-08-22 MED ORDER — AMIODARONE HCL 200 MG PO TABS: 200.0000 mg | ORAL_TABLET | Freq: Every day | ORAL | 2 refills | Status: DC

## 2020-08-22 NOTE — Addendum Note (Signed)
Addended by: Juluis Mire on: 08/22/2020 08:37 AM   Modules accepted: Orders

## 2020-08-22 NOTE — Addendum Note (Signed)
Addended by: Hinda Kehr on: 08/22/2020 08:38 AM   Modules accepted: Orders

## 2020-09-18 ENCOUNTER — Other Ambulatory Visit: Payer: Self-pay | Admitting: Cardiovascular Disease

## 2020-09-19 ENCOUNTER — Other Ambulatory Visit: Payer: Self-pay

## 2020-09-19 ENCOUNTER — Ambulatory Visit: Payer: BC Managed Care – PPO | Admitting: Cardiovascular Disease

## 2020-09-19 ENCOUNTER — Encounter: Payer: Self-pay | Admitting: Cardiovascular Disease

## 2020-09-19 VITALS — BP 146/71 | HR 61 | Ht 65.0 in | Wt 354.2 lb

## 2020-09-19 DIAGNOSIS — Z4509 Encounter for adjustment and management of other cardiac device: Secondary | ICD-10-CM

## 2020-09-19 DIAGNOSIS — I480 Paroxysmal atrial fibrillation: Secondary | ICD-10-CM | POA: Diagnosis not present

## 2020-09-19 DIAGNOSIS — E1169 Type 2 diabetes mellitus with other specified complication: Secondary | ICD-10-CM

## 2020-09-19 DIAGNOSIS — E669 Obesity, unspecified: Secondary | ICD-10-CM

## 2020-09-19 DIAGNOSIS — Z7901 Long term (current) use of anticoagulants: Secondary | ICD-10-CM

## 2020-09-19 DIAGNOSIS — I10 Essential (primary) hypertension: Secondary | ICD-10-CM

## 2020-09-19 DIAGNOSIS — Z79899 Other long term (current) drug therapy: Secondary | ICD-10-CM

## 2020-09-19 DIAGNOSIS — Z5181 Encounter for therapeutic drug level monitoring: Secondary | ICD-10-CM

## 2020-09-19 LAB — COMPREHENSIVE METABOLIC PANEL
ALT: 13 IU/L (ref 0–32)
AST: 14 IU/L (ref 0–40)
Albumin/Globulin Ratio: 1.3 (ref 1.2–2.2)
Albumin: 4.1 g/dL (ref 3.8–4.8)
Alkaline Phosphatase: 107 IU/L (ref 44–121)
BUN/Creatinine Ratio: 10 — ABNORMAL LOW (ref 12–28)
BUN: 9 mg/dL (ref 8–27)
Bilirubin Total: 0.5 mg/dL (ref 0.0–1.2)
CO2: 30 mmol/L — ABNORMAL HIGH (ref 20–29)
Calcium: 9.1 mg/dL (ref 8.7–10.3)
Chloride: 100 mmol/L (ref 96–106)
Creatinine, Ser: 0.93 mg/dL (ref 0.57–1.00)
GFR calc Af Amer: 76 mL/min/{1.73_m2} (ref >59)
GFR calc non Af Amer: 66 mL/min/{1.73_m2} (ref >59)
Globulin, Total: 3.2 g/dL (ref 1.5–4.5)
Glucose: 86 mg/dL (ref 65–99)
Potassium: 3.6 mmol/L (ref 3.5–5.2)
Sodium: 141 mmol/L (ref 134–144)
Total Protein: 7.3 g/dL (ref 6.0–8.5)

## 2020-09-19 LAB — T4, FREE: Free T4: 1.52 ng/dL (ref 0.82–1.77)

## 2020-09-19 LAB — TSH: TSH: 3.9 u[IU]/mL (ref 0.450–4.500)
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
## 2020-09-19 NOTE — Patient Instructions (Signed)
Medication Instructions:  No changes *If you need a refill on your cardiac medications before your next appointment, please call your pharmacy*   Lab Work: Your provider would like for you to have the following labs today: CMET, TSH and Free T4  If you have labs (blood work) drawn today and your tests are completely normal, you will receive your results only by: Marland Kitchen MyChart Message (if you have MyChart) OR . A paper copy in the mail If you have any lab test that is abnormal or we need to change your treatment, we will call you to review the results.   Testing/Procedures: None ordered   Follow-Up: At [facility], you and your health needs are our priority.  As part of our continuing mission to provide you with exceptional heart care, we have created designated Provider Care Teams.  These Care Teams include your primary Cardiologist (physician) and Advanced Practice Providers (APPs -  Physician Assistants and Nurse Practitioners) who all work together to provide you with the care you need, when you need it.  We recommend signing up for the patient portal called "MyChart".  Sign up information is provided on this After Visit Summary.  MyChart is used to connect with patients for Virtual Visits (Telemedicine).  Patients are able to view lab/test results, encounter notes, upcoming appointments, etc.  Non-urgent messages can be sent to your provider as well.   To learn more about what you can do with MyChart, go to [email or web address].    Your next appointment:   6 month(s)  The format for your next appointment:   In Person  Provider:   You may see Sanda Klein, MD or one of the following Advanced Practice Providers on your designated Care Team:    Almyra Deforest, PA-C  Fabian Sharp, PA-C or   Roby Lofts, [place]

## 2020-10-17 ENCOUNTER — Ambulatory Visit (INDEPENDENT_AMBULATORY_CARE_PROVIDER_SITE_OTHER): Payer: BC Managed Care – PPO

## 2020-10-17 DIAGNOSIS — I4819 Other persistent atrial fibrillation: Secondary | ICD-10-CM | POA: Diagnosis not present

## 2020-10-17 LAB — CUP PACEART REMOTE DEVICE CHECK
Date Time Interrogation Session: 20211210030657
Implantable Pulse Generator Implant Date: 20180523

## 2020-10-30 NOTE — Progress Notes (Signed)
Carelink Summary Report / Loop Recorder 

## 2020-11-17 ENCOUNTER — Ambulatory Visit (INDEPENDENT_AMBULATORY_CARE_PROVIDER_SITE_OTHER): Payer: BC Managed Care – PPO

## 2020-11-17 DIAGNOSIS — I4819 Other persistent atrial fibrillation: Secondary | ICD-10-CM

## 2020-11-19 LAB — CUP PACEART REMOTE DEVICE CHECK
Date Time Interrogation Session: 20220112033306
Implantable Pulse Generator Implant Date: 20180523

## 2020-12-01 NOTE — Progress Notes (Signed)
Carelink Summary Report / Loop Recorder 

## 2020-12-17 ENCOUNTER — Other Ambulatory Visit: Payer: Self-pay

## 2020-12-17 ENCOUNTER — Other Ambulatory Visit: Payer: Self-pay | Admitting: Cardiovascular Disease

## 2020-12-17 MED ORDER — APIXABAN 5 MG PO TABS: ORAL_TABLET | ORAL | 1 refills | Status: DC

## 2020-12-18 ENCOUNTER — Ambulatory Visit (INDEPENDENT_AMBULATORY_CARE_PROVIDER_SITE_OTHER): Payer: BC Managed Care – PPO

## 2020-12-18 DIAGNOSIS — I4819 Other persistent atrial fibrillation: Secondary | ICD-10-CM

## 2020-12-22 LAB — CUP PACEART REMOTE DEVICE CHECK
Date Time Interrogation Session: 20220214041259
Implantable Pulse Generator Implant Date: 20180523

## 2020-12-24 ENCOUNTER — Telehealth: Payer: Self-pay | Admitting: *Deleted

## 2020-12-24 NOTE — Telephone Encounter (Signed)
Prior auth sent through CoverMyMeds: Key: [id number]

## 2020-12-24 NOTE — Progress Notes (Signed)
Carelink Summary Report / Loop Recorder 
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
## 2020-12-26 ENCOUNTER — Ambulatory Visit (HOSPITAL_COMMUNITY)
Admission: RE | Admit: 2020-12-26 | Discharge: 2020-12-26 | Disposition: A | Discharge status: Home / Self Care | Payer: BC Managed Care – PPO | Source: Ambulatory Visit | Attending: Physician Assistant | Admitting: Physician Assistant

## 2020-12-26 ENCOUNTER — Encounter (HOSPITAL_COMMUNITY): Payer: Self-pay | Admitting: Physician Assistant

## 2020-12-26 ENCOUNTER — Other Ambulatory Visit: Payer: Self-pay

## 2020-12-26 VITALS — BP 152/90 | HR 59 | Ht 65.0 in

## 2020-12-26 DIAGNOSIS — I4819 Other persistent atrial fibrillation: Secondary | ICD-10-CM | POA: Diagnosis present

## 2020-12-26 DIAGNOSIS — Z79899 Other long term (current) drug therapy: Secondary | ICD-10-CM | POA: Diagnosis not present

## 2020-12-26 DIAGNOSIS — I10 Essential (primary) hypertension: Secondary | ICD-10-CM | POA: Diagnosis not present

## 2020-12-26 DIAGNOSIS — Z7984 Long term (current) use of oral hypoglycemic drugs: Secondary | ICD-10-CM | POA: Diagnosis not present

## 2020-12-26 DIAGNOSIS — E119 Type 2 diabetes mellitus without complications: Secondary | ICD-10-CM | POA: Diagnosis not present

## 2020-12-26 DIAGNOSIS — I484 Atypical atrial flutter: Secondary | ICD-10-CM | POA: Diagnosis not present

## 2020-12-26 DIAGNOSIS — Z6841 Body Mass Index (BMI) 40.0 and over, adult: Secondary | ICD-10-CM | POA: Insufficient documentation

## 2020-12-26 DIAGNOSIS — Z7901 Long term (current) use of anticoagulants: Secondary | ICD-10-CM | POA: Diagnosis not present

## 2020-12-26 DIAGNOSIS — D6869 Other thrombophilia: Secondary | ICD-10-CM

## 2020-12-26 DIAGNOSIS — Z79890 Hormone replacement therapy (postmenopausal): Secondary | ICD-10-CM | POA: Diagnosis not present

## 2021-01-16 ENCOUNTER — Other Ambulatory Visit: Payer: Self-pay

## 2021-01-16 ENCOUNTER — Ambulatory Visit (HOSPITAL_COMMUNITY)
Admission: RE | Admit: 2021-01-16 | Discharge: 2021-01-16 | Disposition: A | Discharge status: Home / Self Care | Payer: BC Managed Care – PPO | Source: Ambulatory Visit | Attending: Physician Assistant | Admitting: Physician Assistant

## 2021-01-16 DIAGNOSIS — I4892 Unspecified atrial flutter: Secondary | ICD-10-CM | POA: Diagnosis present

## 2021-01-16 DIAGNOSIS — I484 Atypical atrial flutter: Secondary | ICD-10-CM

## 2021-01-16 DIAGNOSIS — Z79899 Other long term (current) drug therapy: Secondary | ICD-10-CM | POA: Insufficient documentation

## 2021-01-16 MED ORDER — AMIODARONE HCL 200 MG PO TABS: 100.0000 mg | ORAL_TABLET | Freq: Every day | ORAL | 2 refills | Status: DC

## 2021-01-16 NOTE — Progress Notes (Signed)
Patient returns for ECG. She remains in atrial flutter with slow response. However, she states this is "the best I have felt in years." She would like to pursue a rate control strategy. We discussed that there are other medications with better side effect profiles for rate control. Will decrease amiodarone to 100 mg daily. Can consider transitioning amlodipine to diltiazem if needed for rate control. Follow up with Dr Sallyanne Kuster per recall.

## 2021-01-16 NOTE — Patient Instructions (Signed)
Amiodarone to 100mg  once a day

## 2021-01-19 ENCOUNTER — Ambulatory Visit (INDEPENDENT_AMBULATORY_CARE_PROVIDER_SITE_OTHER): Payer: BC Managed Care – PPO

## 2021-01-19 DIAGNOSIS — I4819 Other persistent atrial fibrillation: Secondary | ICD-10-CM | POA: Diagnosis not present

## 2021-01-26 ENCOUNTER — Telehealth: Payer: Self-pay

## 2021-01-26 LAB — CUP PACEART REMOTE DEVICE CHECK
Date Time Interrogation Session: 20220319051028
Implantable Pulse Generator Implant Date: 20180523

## 2021-01-26 NOTE — Telephone Encounter (Signed)
Thanks, will keep an eye out for repeat, but I do not think we will plan to reimplant anyway.

## 2021-01-26 NOTE — Telephone Encounter (Signed)
Carelink alert received 01/24/21 at 12:05 AM stating linq had reached RRT 01/21/21. Patient notified this am and requested to speak with Dr. Sallyanne Kuster prior to making decision to explant. After speaking with patient it is noted that second remote was reviewed and transmitted by [employer] that states battery is OK on transmission 01/24/21 at 5:10 AM. Implant date 03/30/17. Requested patient send manual transmission to confirm battery status. Patient states she is at work but will send later this evening once she returns home from work. Patient is noted to have Aflutter and is engaged with Afib clinic with recent visit 01/16/21. Patient appreciative of follow up call. Will follow up once transmission received and batter status confirmed.

## 2021-01-27 ENCOUNTER — Telehealth: Payer: Self-pay

## 2021-01-27 NOTE — Telephone Encounter (Signed)
Called patient to advise, per Dr. Sallyanne Kuster, we will monitor for another message about the battery. Patient agreeable to plan.

## 2021-01-27 NOTE — Progress Notes (Signed)
Carelink Summary Report / Loop Recorder 

## 2021-01-28 NOTE — Telephone Encounter (Signed)
Successful telephone encounter to [facility] after manual remote transmission received for loop recorder. Confirmed device in RRT. Patient instructed to unplug remote transmitter. Address confirmed. Return kit requested. All future remote transmission appointment's cancelled. [employer] and Carelink updated. Patient has upcomming appointment with Dr. Sallyanne Kuster 02/17/21 and will discuss explant of device at that time.

## 2021-02-13 ENCOUNTER — Other Ambulatory Visit (HOSPITAL_COMMUNITY): Payer: Self-pay | Admitting: *Deleted

## 2021-02-13 DIAGNOSIS — I480 Paroxysmal atrial fibrillation: Secondary | ICD-10-CM

## 2021-02-13 MED ORDER — ATENOLOL 100 MG PO TABS: 100.0000 mg | ORAL_TABLET | Freq: Two times a day (BID) | ORAL | 6 refills | Status: DC

## 2021-02-17 ENCOUNTER — Ambulatory Visit: Payer: BC Managed Care – PPO | Admitting: Cardiovascular Disease

## 2021-02-17 ENCOUNTER — Encounter: Payer: Self-pay | Admitting: Cardiovascular Disease

## 2021-02-17 ENCOUNTER — Other Ambulatory Visit: Payer: Self-pay

## 2021-02-17 VITALS — BP 136/90 | HR 66 | Ht 65.0 in | Wt 357.0 lb

## 2021-02-17 DIAGNOSIS — Z5181 Encounter for therapeutic drug level monitoring: Secondary | ICD-10-CM

## 2021-02-17 DIAGNOSIS — I10 Essential (primary) hypertension: Secondary | ICD-10-CM | POA: Diagnosis not present

## 2021-02-17 DIAGNOSIS — E1169 Type 2 diabetes mellitus with other specified complication: Secondary | ICD-10-CM

## 2021-02-17 DIAGNOSIS — Z79899 Other long term (current) drug therapy: Secondary | ICD-10-CM

## 2021-02-17 DIAGNOSIS — Z4509 Encounter for adjustment and management of other cardiac device: Secondary | ICD-10-CM

## 2021-02-17 DIAGNOSIS — Z7901 Long term (current) use of anticoagulants: Secondary | ICD-10-CM | POA: Diagnosis not present

## 2021-02-17 DIAGNOSIS — I484 Atypical atrial flutter: Secondary | ICD-10-CM | POA: Diagnosis not present

## 2021-02-17 DIAGNOSIS — E669 Obesity, unspecified: Secondary | ICD-10-CM
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
## 2021-02-17 NOTE — Patient Instructions (Signed)

## 2021-06-04 ENCOUNTER — Other Ambulatory Visit: Payer: Self-pay | Admitting: Physician Assistant

## 2021-06-04 DIAGNOSIS — Z1231 Encounter for screening mammogram for malignant neoplasm of breast: Secondary | ICD-10-CM

## 2021-06-15 ENCOUNTER — Other Ambulatory Visit: Payer: Self-pay | Admitting: Cardiovascular Disease

## 2021-06-17 ENCOUNTER — Other Ambulatory Visit: Payer: Self-pay

## 2021-06-17 MED ORDER — APIXABAN 5 MG PO TABS: ORAL_TABLET | ORAL | 1 refills | Status: DC

## 2021-06-17 NOTE — Telephone Encounter (Signed)
Prescription refill request for Eliquis received. Indication:afib Last office visit:croitoru 02/17/21 Scr:0.93 09/19/20 Age: 76fWeight:161.9kg

## 2021-06-25 ENCOUNTER — Ambulatory Visit: Payer: BC Managed Care – PPO

## 2021-08-25 ENCOUNTER — Other Ambulatory Visit (HOSPITAL_COMMUNITY): Payer: Self-pay | Admitting: Physician Assistant

## 2021-08-25 ENCOUNTER — Encounter: Payer: Self-pay | Admitting: Nurse Practitioner

## 2021-08-25 DIAGNOSIS — I480 Paroxysmal atrial fibrillation: Secondary | ICD-10-CM

## 2021-08-26 ENCOUNTER — Emergency Department (HOSPITAL_COMMUNITY): Payer: BC Managed Care – PPO

## 2021-08-26 ENCOUNTER — Encounter (HOSPITAL_COMMUNITY): Payer: Self-pay

## 2021-08-26 ENCOUNTER — Other Ambulatory Visit: Payer: Self-pay

## 2021-08-26 ENCOUNTER — Inpatient Hospital Stay (HOSPITAL_COMMUNITY)
Admission: EM | Admit: 2021-08-26 | Discharge: 2021-08-29 | DRG: 291 | Disposition: A | Discharge status: Home / Self Care | Payer: BC Managed Care – PPO | Attending: Internal Medicine | Admitting: Internal Medicine

## 2021-08-26 DIAGNOSIS — E785 Hyperlipidemia, unspecified: Secondary | ICD-10-CM | POA: Diagnosis present

## 2021-08-26 DIAGNOSIS — E1169 Type 2 diabetes mellitus with other specified complication: Secondary | ICD-10-CM | POA: Diagnosis not present

## 2021-08-26 DIAGNOSIS — I110 Hypertensive heart disease with heart failure: Secondary | ICD-10-CM | POA: Diagnosis present

## 2021-08-26 DIAGNOSIS — Z20822 Contact with and (suspected) exposure to covid-19: Secondary | ICD-10-CM | POA: Diagnosis present

## 2021-08-26 DIAGNOSIS — E039 Hypothyroidism, unspecified: Secondary | ICD-10-CM | POA: Diagnosis present

## 2021-08-26 DIAGNOSIS — Z79899 Other long term (current) drug therapy: Secondary | ICD-10-CM

## 2021-08-26 DIAGNOSIS — Z8249 Family history of ischemic heart disease and other diseases of the circulatory system: Secondary | ICD-10-CM | POA: Diagnosis not present

## 2021-08-26 DIAGNOSIS — I5031 Acute diastolic (congestive) heart failure: Secondary | ICD-10-CM | POA: Diagnosis not present

## 2021-08-26 DIAGNOSIS — F419 Anxiety disorder, unspecified: Secondary | ICD-10-CM | POA: Diagnosis present

## 2021-08-26 DIAGNOSIS — Z91013 Allergy to seafood: Secondary | ICD-10-CM | POA: Diagnosis not present

## 2021-08-26 DIAGNOSIS — Z6841 Body Mass Index (BMI) 40.0 and over, adult: Secondary | ICD-10-CM

## 2021-08-26 DIAGNOSIS — Z833 Family history of diabetes mellitus: Secondary | ICD-10-CM

## 2021-08-26 DIAGNOSIS — J9601 Acute respiratory failure with hypoxia: Secondary | ICD-10-CM | POA: Diagnosis present

## 2021-08-26 DIAGNOSIS — I509 Heart failure, unspecified: Secondary | ICD-10-CM

## 2021-08-26 DIAGNOSIS — I483 Typical atrial flutter: Secondary | ICD-10-CM | POA: Diagnosis present

## 2021-08-26 DIAGNOSIS — J9621 Acute and chronic respiratory failure with hypoxia: Secondary | ICD-10-CM

## 2021-08-26 DIAGNOSIS — I480 Paroxysmal atrial fibrillation: Secondary | ICD-10-CM

## 2021-08-26 DIAGNOSIS — E876 Hypokalemia: Secondary | ICD-10-CM | POA: Diagnosis present

## 2021-08-26 DIAGNOSIS — F32A Depression, unspecified: Secondary | ICD-10-CM | POA: Diagnosis present

## 2021-08-26 DIAGNOSIS — Z7984 Long term (current) use of oral hypoglycemic drugs: Secondary | ICD-10-CM | POA: Diagnosis not present

## 2021-08-26 DIAGNOSIS — R9431 Abnormal electrocardiogram [ECG] [EKG]: Secondary | ICD-10-CM

## 2021-08-26 DIAGNOSIS — I4892 Unspecified atrial flutter: Secondary | ICD-10-CM | POA: Diagnosis present

## 2021-08-26 DIAGNOSIS — I5033 Acute on chronic diastolic (congestive) heart failure: Secondary | ICD-10-CM | POA: Diagnosis present

## 2021-08-26 DIAGNOSIS — E119 Type 2 diabetes mellitus without complications: Secondary | ICD-10-CM | POA: Diagnosis present

## 2021-08-26 DIAGNOSIS — I10 Essential (primary) hypertension: Secondary | ICD-10-CM | POA: Diagnosis not present

## 2021-08-26 DIAGNOSIS — G43909 Migraine, unspecified, not intractable, without status migrainosus: Secondary | ICD-10-CM | POA: Diagnosis present

## 2021-08-26 DIAGNOSIS — R0602 Shortness of breath: Secondary | ICD-10-CM | POA: Diagnosis not present

## 2021-08-26 DIAGNOSIS — R7989 Other specified abnormal findings of blood chemistry: Secondary | ICD-10-CM | POA: Diagnosis present

## 2021-08-26 DIAGNOSIS — Z7901 Long term (current) use of anticoagulants: Secondary | ICD-10-CM

## 2021-08-26 DIAGNOSIS — Z79890 Hormone replacement therapy (postmenopausal): Secondary | ICD-10-CM

## 2021-08-26 DIAGNOSIS — E669 Obesity, unspecified: Secondary | ICD-10-CM | POA: Diagnosis not present

## 2021-08-26 DIAGNOSIS — I484 Atypical atrial flutter: Secondary | ICD-10-CM | POA: Diagnosis present

## 2021-08-26 DIAGNOSIS — E662 Morbid (severe) obesity with alveolar hypoventilation: Secondary | ICD-10-CM | POA: Diagnosis present

## 2021-08-26 DIAGNOSIS — D72829 Elevated white blood cell count, unspecified: Secondary | ICD-10-CM

## 2021-08-26 DIAGNOSIS — K219 Gastro-esophageal reflux disease without esophagitis: Secondary | ICD-10-CM | POA: Diagnosis present

## 2021-08-26 LAB — URINALYSIS, ROUTINE W REFLEX MICROSCOPIC
Bacteria, UA: NONE SEEN
Bilirubin Urine: NEGATIVE
Glucose, UA: NEGATIVE mg/dL
Ketones, ur: NEGATIVE mg/dL
Leukocytes,Ua: NEGATIVE
Nitrite: NEGATIVE
Protein, ur: NEGATIVE mg/dL
Specific Gravity, Urine: 1.005 (ref 1.005–1.030)
pH: 7 (ref 5.0–8.0)

## 2021-08-26 LAB — BASIC METABOLIC PANEL
Anion gap: 11 (ref 5–15)
BUN: 8 mg/dL (ref 8–23)
CO2: 28 mmol/L (ref 22–32)
Calcium: 9.2 mg/dL (ref 8.9–10.3)
Chloride: 97 mmol/L — ABNORMAL LOW (ref 98–111)
Creatinine, Ser: 0.88 mg/dL (ref 0.44–1.00)
GFR, Estimated: >60 mL/min (ref >60)
Glucose, Bld: 159 mg/dL — ABNORMAL HIGH (ref 70–99)
Potassium: 3.7 mmol/L (ref 3.5–5.1)
Sodium: 136 mmol/L (ref 135–145)

## 2021-08-26 LAB — HIV ANTIBODY (ROUTINE TESTING W REFLEX): HIV Screen 4th Generation wRfx: NONREACTIVE

## 2021-08-26 LAB — BRAIN NATRIURETIC PEPTIDE: B Natriuretic Peptide: 537.8 pg/mL — ABNORMAL HIGH (ref 0.0–100.0)

## 2021-08-26 LAB — CBC WITH DIFFERENTIAL/PLATELET
Abs Immature Granulocytes: 0.08 10*3/uL — ABNORMAL HIGH (ref 0.00–0.07)
Basophils Absolute: 0.1 10*3/uL (ref 0.0–0.1)
Basophils Relative: 0 %
Eosinophils Absolute: 0 10*3/uL (ref 0.0–0.5)
Eosinophils Relative: 0 %
HCT: 38.4 % (ref 36.0–46.0)
Hemoglobin: 12 g/dL (ref 12.0–15.0)
Immature Granulocytes: 1 %
Lymphocytes Relative: 9 %
Lymphs Abs: 1.2 10*3/uL (ref 0.7–4.0)
MCH: 29.3 pg (ref 26.0–34.0)
MCHC: 31.3 g/dL (ref 30.0–36.0)
MCV: 93.9 fL (ref 80.0–100.0)
Monocytes Absolute: 0.9 10*3/uL (ref 0.1–1.0)
Monocytes Relative: 7 %
Neutro Abs: 10.2 10*3/uL — ABNORMAL HIGH (ref 1.7–7.7)
Neutrophils Relative %: 83 %
Platelets: 233 10*3/uL (ref 150–400)
RBC: 4.09 MIL/uL (ref 3.87–5.11)
RDW: 14.5 % (ref 11.5–15.5)
WBC: 12.4 10*3/uL — ABNORMAL HIGH (ref 4.0–10.5)
nRBC: 0 % (ref 0.0–0.2)

## 2021-08-26 LAB — RESP PANEL BY RT-PCR (FLU A&B, COVID) ARPGX2
Influenza A by PCR: NEGATIVE
Influenza B by PCR: NEGATIVE
SARS Coronavirus 2 by RT PCR: NEGATIVE

## 2021-08-26 LAB — CBG MONITORING, ED: Glucose-Capillary: 168 mg/dL — ABNORMAL HIGH (ref 70–99)

## 2021-08-26 LAB — GLUCOSE, CAPILLARY: Glucose-Capillary: 110 mg/dL — ABNORMAL HIGH (ref 70–99)

## 2021-08-26 LAB — PROCALCITONIN: Procalcitonin: <0.1 ng/mL

## 2021-08-26 LAB — TROPONIN I (HIGH SENSITIVITY)
Troponin I (High Sensitivity): 10 ng/L (ref <18)
Troponin I (High Sensitivity): 12 ng/L (ref <18)

## 2021-08-26 LAB — TSH: TSH: 1.517 u[IU]/mL (ref 0.350–4.500)

## 2021-08-26 MED ORDER — ASPIRIN 81 MG PO CHEW
324.0000 mg | CHEWABLE_TABLET | Freq: Once | ORAL | Status: AC
  Administered 2021-08-26: 324 mg via ORAL
  Filled 2021-08-26: qty 4

## 2021-08-26 MED ORDER — APIXABAN 5 MG PO TABS
5.0000 mg | ORAL_TABLET | Freq: Two times a day (BID) | ORAL | Status: DC
  Administered 2021-08-26 – 2021-08-29 (×7): 5 mg via ORAL
  Filled 2021-08-26 (×7): qty 1

## 2021-08-26 MED ORDER — ALBUTEROL SULFATE (2.5 MG/3ML) 0.083% IN NEBU: 2.5000 mg | INHALATION_SOLUTION | Freq: Four times a day (QID) | RESPIRATORY_TRACT | Status: DC | PRN

## 2021-08-26 MED ORDER — POTASSIUM CHLORIDE CRYS ER 20 MEQ PO TBCR
20.0000 meq | EXTENDED_RELEASE_TABLET | Freq: Two times a day (BID) | ORAL | Status: DC
  Administered 2021-08-26 – 2021-08-29 (×6): 20 meq via ORAL
  Filled 2021-08-26 (×5): qty 1

## 2021-08-26 MED ORDER — INSULIN ASPART 100 UNIT/ML IJ SOLN
0.0000 [IU] | Freq: Three times a day (TID) | INTRAMUSCULAR | Status: DC
  Administered 2021-08-26: 2 [IU] via SUBCUTANEOUS
  Administered 2021-08-27: 1 [IU] via SUBCUTANEOUS

## 2021-08-26 MED ORDER — AMIODARONE HCL 100 MG PO TABS
100.0000 mg | ORAL_TABLET | Freq: Every day | ORAL | Status: DC
  Administered 2021-08-26 – 2021-08-29 (×4): 100 mg via ORAL
  Filled 2021-08-26 (×4): qty 1

## 2021-08-26 MED ORDER — SODIUM CHLORIDE 0.9% FLUSH: 3.0000 mL | INTRAVENOUS | Status: DC | PRN

## 2021-08-26 MED ORDER — SODIUM CHLORIDE 0.9 % IV SOLN: 250.0000 mL | INTRAVENOUS | Status: DC | PRN

## 2021-08-26 MED ORDER — LEVOTHYROXINE SODIUM 50 MCG PO TABS
50.0000 ug | ORAL_TABLET | Freq: Every day | ORAL | Status: DC
  Administered 2021-08-26 – 2021-08-29 (×4): 50 ug via ORAL
  Filled 2021-08-26: qty 1
  Filled 2021-08-26: qty 2
  Filled 2021-08-26 (×2): qty 1

## 2021-08-26 MED ORDER — FUROSEMIDE 10 MG/ML IJ SOLN
60.0000 mg | Freq: Once | INTRAMUSCULAR | Status: AC
  Administered 2021-08-26: 60 mg via INTRAVENOUS
  Filled 2021-08-26: qty 6

## 2021-08-26 MED ORDER — ATORVASTATIN CALCIUM 10 MG PO TABS
10.0000 mg | ORAL_TABLET | Freq: Every day | ORAL | Status: DC
  Administered 2021-08-26 – 2021-08-29 (×4): 10 mg via ORAL
  Filled 2021-08-26 (×4): qty 1

## 2021-08-26 MED ORDER — SODIUM CHLORIDE 0.9% FLUSH
3.0000 mL | Freq: Two times a day (BID) | INTRAVENOUS | Status: DC
  Administered 2021-08-26 – 2021-08-28 (×6): 3 mL via INTRAVENOUS

## 2021-08-26 MED ORDER — ATENOLOL 100 MG PO TABS
100.0000 mg | ORAL_TABLET | Freq: Two times a day (BID) | ORAL | Status: DC
  Administered 2021-08-26 – 2021-08-28 (×6): 100 mg via ORAL
  Filled 2021-08-26: qty 1
  Filled 2021-08-26: qty 4
  Filled 2021-08-26 (×5): qty 1

## 2021-08-26 MED ORDER — POTASSIUM CHLORIDE CRYS ER 20 MEQ PO TBCR
20.0000 meq | EXTENDED_RELEASE_TABLET | Freq: Once | ORAL | Status: AC
  Administered 2021-08-26: 20 meq via ORAL
  Filled 2021-08-26: qty 1

## 2021-08-26 MED ORDER — CLOTRIMAZOLE 1 % EX CREA
1.0000 "application " | TOPICAL_CREAM | CUTANEOUS | Status: DC
  Administered 2021-08-28: 1 via TOPICAL
  Filled 2021-08-26: qty 15

## 2021-08-26 MED ORDER — DILTIAZEM HCL 30 MG PO TABS: 30.0000 mg | ORAL_TABLET | ORAL | Status: DC | PRN

## 2021-08-26 MED ORDER — AMLODIPINE BESYLATE 10 MG PO TABS
10.0000 mg | ORAL_TABLET | Freq: Every day | ORAL | Status: DC
  Administered 2021-08-26 – 2021-08-27 (×2): 10 mg via ORAL
  Filled 2021-08-26: qty 1
  Filled 2021-08-26: qty 2

## 2021-08-26 MED ORDER — LISINOPRIL 20 MG PO TABS
20.0000 mg | ORAL_TABLET | Freq: Every day | ORAL | Status: DC
  Administered 2021-08-26 – 2021-08-29 (×4): 20 mg via ORAL
  Filled 2021-08-26 (×4): qty 1

## 2021-08-26 MED ORDER — ACETAMINOPHEN 325 MG PO TABS
650.0000 mg | ORAL_TABLET | ORAL | Status: DC | PRN
  Administered 2021-08-26 (×2): 650 mg via ORAL
  Filled 2021-08-26 (×2): qty 2

## 2021-08-26 MED ORDER — FUROSEMIDE 10 MG/ML IJ SOLN
40.0000 mg | Freq: Two times a day (BID) | INTRAMUSCULAR | Status: DC
  Administered 2021-08-26 – 2021-08-27 (×3): 40 mg via INTRAVENOUS
  Filled 2021-08-26 (×3): qty 4

## 2021-08-26 MED ORDER — ONDANSETRON HCL 4 MG/2ML IJ SOLN: 4.0000 mg | Freq: Four times a day (QID) | INTRAMUSCULAR | Status: DC | PRN

## 2021-08-26 NOTE — ED Notes (Signed)
Lunch ordered 

## 2021-08-26 NOTE — ED Provider Notes (Signed)
Emergency Medicine Provider Triage Evaluation Note  Vicki Wheeler , a 65 y.o. female  was evaluated in triage.  Pt complains of SOB.  Hx of CHF.  Reports worsening leg swelling.  Has had some cough.  Review of Systems  Positive: Sob, cough, leg swelling Negative: Fever, chills  Physical Exam  Ht 5\' 5"  (1.651 m)   Wt (!) 160.1 kg   LMP 04/09/2007   SpO2 (!) 82%   BMI 58.74 kg/m  Gen:   Awake, no distress   Resp:  Mildly increased WOB, slight wheezing, diminished throughout MSK:   Moves extremities without difficulty  Other:    Medical Decision Making  Medically screening exam initiated at 4:08 AM.  Appropriate orders placed.  Vicki Wheeler was informed that the remainder of the evaluation will be completed by another provider, this initial triage assessment does not replace that evaluation, and the importance of remaining in the ED until their evaluation is complete.  SOB   Montine Circle, PA-C 08/26/21 [id number]    Ezequiel Essex, MD 08/26/21 [phone]
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
## 2021-08-26 NOTE — ED Triage Notes (Signed)
BIB [facility] for SOB x 4 days. Worse with exertion. Lung sounds clear and equal bilat, per pt stomach bug recently. PIV 18ga Lt AC. [place] 4lpm RA sat 86%.
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
## 2021-08-26 NOTE — ED Notes (Signed)
ED Provider at bedside. 

## 2021-08-26 NOTE — ED Notes (Signed)
BSC commode placed in room. No acute changes noted. Pt offered purewick and she refused at this time. Will continue to monitor.
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
## 2021-08-27 ENCOUNTER — Inpatient Hospital Stay (HOSPITAL_COMMUNITY): Payer: BC Managed Care – PPO

## 2021-08-27 DIAGNOSIS — I5033 Acute on chronic diastolic (congestive) heart failure: Secondary | ICD-10-CM

## 2021-08-27 DIAGNOSIS — I509 Heart failure, unspecified: Secondary | ICD-10-CM

## 2021-08-27 DIAGNOSIS — I5031 Acute diastolic (congestive) heart failure: Secondary | ICD-10-CM

## 2021-08-27 DIAGNOSIS — J9601 Acute respiratory failure with hypoxia: Secondary | ICD-10-CM

## 2021-08-27 LAB — BASIC METABOLIC PANEL
Anion gap: 11 (ref 5–15)
BUN: 9 mg/dL (ref 8–23)
CO2: 31 mmol/L (ref 22–32)
Calcium: 8.7 mg/dL — ABNORMAL LOW (ref 8.9–10.3)
Chloride: 99 mmol/L (ref 98–111)
Creatinine, Ser: 0.99 mg/dL (ref 0.44–1.00)
GFR, Estimated: >60 mL/min (ref >60)
Glucose, Bld: 134 mg/dL — ABNORMAL HIGH (ref 70–99)
Potassium: 3.4 mmol/L — ABNORMAL LOW (ref 3.5–5.1)
Sodium: 141 mmol/L (ref 135–145)

## 2021-08-27 LAB — CBC WITH DIFFERENTIAL/PLATELET
Abs Immature Granulocytes: 0.04 10*3/uL (ref 0.00–0.07)
Basophils Absolute: 0 10*3/uL (ref 0.0–0.1)
Basophils Relative: 0 %
Eosinophils Absolute: 0 10*3/uL (ref 0.0–0.5)
Eosinophils Relative: 0 %
HCT: 37.1 % (ref 36.0–46.0)
Hemoglobin: 11.9 g/dL — ABNORMAL LOW (ref 12.0–15.0)
Immature Granulocytes: 0 %
Lymphocytes Relative: 16 %
Lymphs Abs: 1.6 10*3/uL (ref 0.7–4.0)
MCH: 29.7 pg (ref 26.0–34.0)
MCHC: 32.1 g/dL (ref 30.0–36.0)
MCV: 92.5 fL (ref 80.0–100.0)
Monocytes Absolute: 0.9 10*3/uL (ref 0.1–1.0)
Monocytes Relative: 9 %
Neutro Abs: 7.4 10*3/uL (ref 1.7–7.7)
Neutrophils Relative %: 75 %
Platelets: 209 10*3/uL (ref 150–400)
RBC: 4.01 MIL/uL (ref 3.87–5.11)
RDW: 14.2 % (ref 11.5–15.5)
WBC: 10 10*3/uL (ref 4.0–10.5)
nRBC: 0 % (ref 0.0–0.2)

## 2021-08-27 LAB — ECHOCARDIOGRAM COMPLETE
AR max vel: 2.22 cm2
AV Area VTI: 2.45 cm2
AV Area mean vel: 2.28 cm2
AV Mean grad: 4 mmHg
AV Peak grad: 7.8 mmHg
Ao pk vel: 1.4 m/s
Height: 65 in
S' Lateral: 3.6 cm
Weight: 5548.8 oz

## 2021-08-27 LAB — HEMOGLOBIN A1C
Hgb A1c MFr Bld: 6 % — ABNORMAL HIGH (ref 4.8–5.6)
Mean Plasma Glucose: 125.5 mg/dL

## 2021-08-27 LAB — HEPATIC FUNCTION PANEL
ALT: 17 U/L (ref 0–44)
AST: 16 U/L (ref 15–41)
Albumin: 2.9 g/dL — ABNORMAL LOW (ref 3.5–5.0)
Alkaline Phosphatase: 66 U/L (ref 38–126)
Bilirubin, Direct: 0.3 mg/dL — ABNORMAL HIGH (ref 0.0–0.2)
Indirect Bilirubin: 1.1 mg/dL — ABNORMAL HIGH (ref 0.3–0.9)
Total Bilirubin: 1.4 mg/dL — ABNORMAL HIGH (ref 0.3–1.2)
Total Protein: 7.2 g/dL (ref 6.5–8.1)

## 2021-08-27 LAB — GLUCOSE, CAPILLARY
Glucose-Capillary: 124 mg/dL — ABNORMAL HIGH (ref 70–99)
Glucose-Capillary: 102 mg/dL — ABNORMAL HIGH (ref 70–99)
Glucose-Capillary: 81 mg/dL (ref 70–99)

## 2021-08-27 NOTE — Progress Notes (Signed)
[facility]                                                                              Patient Demographics  Vicki Wheeler, is a 65 y.o. female, DOB - 1956-04-11, RKY:[id number]  Admit date - 08/26/2021   Admitting Physician Kristopher Oppenheim, DO  Outpatient Primary MD for the patient is Shanon Rosser, PA-C  Outpatient specialists:   LOS - 1  days   Medical records reviewed and are as summarized below:    Chief Complaint  Patient presents with   Shortness of Breath       Brief summary   Patient is a 65 year old female with paroxysmal A. fib, hypertension, diabetes mellitus type 2, anxiety, depression, obesity presented with difficulty breathing.  Patient reported feeling unwell approximately 5 days ago prior to admission, initially attributed her symptoms to working too much.  Per patient, she had urinary frequency, diarrhea, symptoms of 'stomach virus' that seemed to resolve within 24 hours.  Patient also reported that at her workplace, 4 colleagues had recently been COVID-positive.  She tested herself and was negative.  During this time, she started having progressively worsening shortness of breath.  At baseline, lives alone, sleeps sitting up for last several years due to reflux, does not require O2.  However she noticed, she had worsening shortness of breath on ambulating and had to rest to catch her breath.  No fevers or chills, did report productive cough with clear sputum, intermittent wheezing with activity and leg swelling. In ED, patient was found to be hypoxic with O2 sats 82% on room air, improved on 4 L. BNP 537.8, chest x-ray showed pulm edema.  Assessment & Plan    Principal Problem: Acute respiratory failure with hypoxia secondary to acute congestive heart failure (HCC) -Not on O2 at home, requiring 4 L of O2 on admission, at the time of my encounter on 2 L, states starting to feel better after Lasix. -Home O2 evaluation still shows hypoxia, sats  83% at rest and on ambulation plummeted down to 79%  -Continue IV Lasix 40 mg every 12 hours for diuresis, negative balance of 5 L, still has volume overload -Weight 353.3lbs on admission, improved to 346.8 lbs today with diuresis -Follow 2D echo, cardiology consulted. -Continue atenolol, lisinopril  Active Problems:   Essential hypertension -BP now improving, continue atenolol, lisinopril, Norvasc, Lasix    Dyslipidemia -Continue atorvastatin    Paroxysmal atrial fibrillation (HCC) -Heart rate currently controlled, continue beta-blocker, amiodarone - CHA2DS2-VASc 4, continue Eliquis    Diabetes mellitus type 2 in obese ([place]), NIDDM, with hyperlipidemia -Hold metformin, hemoglobin A1c 6.4 -Continue sliding scale insulin    Hypothyroidism -TSH 1.5, continue Synthroid    Prolonged QT interval -Avoid QT prolonging meds -Potassium replaced  Hypokalemia -Potassium 3.4, replaced    Morbid obesity with BMI of 50.0-59.9, adult (HCC) Estimated body mass index is 57.71 kg/m as calculated from the following:   Height as of this encounter: 5\' 5"  (1.651 m).   Weight as of this encounter: 157.3 kg.  Code Status: Full CODE STATUS DVT Prophylaxis:   apixaban (ELIQUIS) tablet 5 mg   Level  of Care: Level of care: Progressive Family Communication: Discussed all imaging results, lab results, explained to the patient   Disposition Plan:     Status is: Inpatient  Remains inpatient appropriate because: On IV Lasix diuresis, currently not at baseline, further work-up based on 2D echo results   Time Spent in minutes   35 minutes  Procedures:  None  Consultants:   Cardiology  Antimicrobials:   Anti-infectives (From admission, onward)    None          Medications  Scheduled Meds:  amiodarone  100 mg Oral Daily   amLODipine  10 mg Oral Daily   apixaban  5 mg Oral BID   atenolol  100 mg Oral BID   atorvastatin  10 mg Oral Daily   clotrimazole  1 application Topical  QODAY   furosemide  40 mg Intravenous BID   insulin aspart  0-9 Units Subcutaneous TID WC   levothyroxine  50 mcg Oral Daily   lisinopril  20 mg Oral Daily   potassium chloride  20 mEq Oral BID   sodium chloride flush  3 mL Intravenous Q12H   Continuous Infusions:  sodium chloride     PRN Meds:.sodium chloride, acetaminophen, albuterol, diltiazem, sodium chloride flush      Subjective:   Vicki Wheeler was seen and examined today.  Still having shortness of breath on exertion, states felt somewhat better after Lasix.  On 2 L O2 via [place] at the time of my encounter.  No acute chest pain, dizziness or lightheadedness.  Patient denies abdominal pain, N/V/D/C.  No fevers  Objective:   Vitals:   08/27/21 0051 08/27/21 0510 08/27/21 0849 08/27/21 1056  BP: (!) 152/84 (!) 148/74 127/76 112/67  Pulse: (!) 104 97 94 84  Resp: 18 18 19 19   Temp: 100.2 F (37.9 C) 98.6 F (37 C) 99.1 F (37.3 C) 99.1 F (37.3 C)  TempSrc: Oral Oral Oral Oral  SpO2: 91% 92% (!) 86% (!) 88%  Weight:  (!) 157.3 kg    Height:        Intake/Output Summary (Last 24 hours) at 08/27/2021 1240 Last data filed at 08/27/2021 1059 Gross per 24 hour  Intake 50 ml  Output 4050 ml  Net -4000 ml     Wt Readings from Last 3 Encounters:  08/27/21 (!) 157.3 kg  02/17/21 (!) 161.9 kg  09/19/20 (!) 160.7 kg     Exam General: Alert and oriented x 3, NAD, pleasant Cardiovascular: S1 S2 auscultated, RRR Respiratory: Diminished breath sound at the bases Gastrointestinal: Obese, soft, nontender, nondistended, + bowel sounds Ext: 1+ pedal edema bilaterally Neuro: no new deficits    Data Reviewed:  I have personally reviewed following labs and imaging studies  Micro Results Recent Results (from the past 240 hour(s))  Resp Panel by RT-PCR (Flu A&B, Covid) Nasopharyngeal Swab     Status: None   Collection Time: 08/26/21  4:09 AM   Specimen: Nasopharyngeal Swab; Nasopharyngeal(NP) swabs in vial transport  medium  Result Value Ref Range Status   SARS Coronavirus 2 by RT PCR NEGATIVE NEGATIVE Final    Comment: (NOTE) SARS-CoV-2 target nucleic acids are NOT DETECTED.  The SARS-CoV-2 RNA is generally detectable in upper respiratory specimens during the acute phase of infection. The lowest concentration of SARS-CoV-2 viral copies this assay can detect is 138 copies/mL. A negative result does not preclude SARS-Cov-2 infection and should not be used as the sole basis for treatment or other patient management decisions.  A negative result may occur with  improper specimen collection/handling, submission of specimen other than nasopharyngeal swab, presence of viral mutation(s) within the areas targeted by this assay, and inadequate number of viral copies(<138 copies/mL). A negative result must be combined with clinical observations, patient history, and epidemiological information. The expected result is Negative.  Fact Sheet for Patients:  EntrepreneurPulse.com.au  Fact Sheet for Healthcare Providers:  IncredibleEmployment.be  This test is no t yet approved or cleared by the [place] FDA and  has been authorized for detection and/or diagnosis of SARS-CoV-2 by FDA under an Emergency Use Authorization (EUA). This EUA will remain  in effect (meaning this test can be used) for the duration of the COVID-19 declaration under Section 564(b)(1) of the Act, 21 U.S.C.section 360bbb-3(b)(1), unless the authorization is terminated  or revoked sooner.       Influenza A by PCR NEGATIVE NEGATIVE Final   Influenza B by PCR NEGATIVE NEGATIVE Final    Comment: (NOTE) The Xpert Xpress SARS-CoV-2/FLU/RSV plus assay is intended as an aid in the diagnosis of influenza from Nasopharyngeal swab specimens and should not be used as a sole basis for treatment. Nasal washings and aspirates are unacceptable for Xpert Xpress SARS-CoV-2/FLU/RSV testing.  Fact Sheet for  Patients: EntrepreneurPulse.com.au  Fact Sheet for Healthcare Providers: IncredibleEmployment.be  This test is not yet approved or cleared by the [place] FDA and has been authorized for detection and/or diagnosis of SARS-CoV-2 by FDA under an Emergency Use Authorization (EUA). This EUA will remain in effect (meaning this test can be used) for the duration of the COVID-19 declaration under Section 564(b)(1) of the Act, 21 U.S.C. section 360bbb-3(b)(1), unless the authorization is terminated or revoked.  Performed at [facility], [place] [address] [place], [place] [address]     Radiology Reports DG Chest 2 View  Result Date: 08/26/2021 CLINICAL DATA:  Reason for exam: sob BIB [facility] for SOB x 4 days. Worse with exertion. Lung sounds clear and equal bilat, per pt stomach bug recently. PIV 18ga Lt AC. [place] 4lpm RA sat 86%. EXAM: CHEST - 2 VIEW COMPARISON:  10/24/2017 FINDINGS: New extensive somewhat granular airspace opacities throughout the right lung and on the left predominantly at the lung base. Heart size upper limits normal as before. Implanted event monitor overlies the left lower chest. No effusion.  No pneumothorax. Visualized bones unremarkable. IMPRESSION: Extensive asymmetric infiltrates or edema, right worse than left. Electronically Signed   By: Lucrezia Europe M.D.   On: 08/26/2021 05:17    Lab Data:  CBC: Recent Labs  Lab 08/26/21 0414 08/27/21 0401  WBC 12.4* 10.0  NEUTROABS 10.2* 7.4  HGB 12.0 11.9*  HCT 38.4 37.1  MCV 93.9 92.5  PLT 233 638   Basic Metabolic Panel: Recent Labs  Lab 08/26/21 0414 08/27/21 0401  NA 136 141  K 3.7 3.4*  CL 97* 99  CO2 28 31  GLUCOSE 159* 134*  BUN 8 9  CREATININE 0.88 0.99  CALCIUM 9.2 8.7*   GFR: Estimated Creatinine Clearance: 89.2 mL/min (by C-G formula based on SCr of 0.99 mg/dL). Liver Function Tests: Recent Labs  Lab 08/27/21 0401  AST 16  ALT 17  ALKPHOS 66  BILITOT  1.4*  PROT 7.2  ALBUMIN 2.9*   No results for input(s): LIPASE, AMYLASE in the last 168 hours. No results for input(s): AMMONIA in the last 168 hours. Coagulation Profile: No results for input(s): INR, PROTIME in the last 168 hours. Cardiac Enzymes: No  results for input(s): CKTOTAL, CKMB, CKMBINDEX, TROPONINI in the last 168 hours. BNP (last 3 results) No results for input(s): PROBNP in the last 8760 hours. HbA1C: Recent Labs    08/27/21 0401  HGBA1C 6.0*   CBG: Recent Labs  Lab 08/26/21 1159 08/26/21 1532 08/27/21 1055  GLUCAP 168* 110* 102*   Lipid Profile: No results for input(s): CHOL, HDL, LDLCALC, TRIG, CHOLHDL, LDLDIRECT in the last 72 hours. Thyroid Function Tests: Recent Labs    08/26/21 0834  TSH 1.517   Anemia Panel: No results for input(s): VITAMINB12, FOLATE, FERRITIN, TIBC, IRON, RETICCTPCT in the last 72 hours. Urine analysis:    Component Value Date/Time   COLORURINE STRAW (A) 08/26/2021 0946   APPEARANCEUR CLEAR 08/26/2021 0946   LABSPEC 1.005 08/26/2021 0946   PHURINE 7.0 08/26/2021 0946   GLUCOSEU NEGATIVE 08/26/2021 0946   HGBUR SMALL (A) 08/26/2021 0946   BILIRUBINUR NEGATIVE 08/26/2021 0946   BILIRUBINUR n 06/17/2015 0914   KETONESUR NEGATIVE 08/26/2021 0946   PROTEINUR NEGATIVE 08/26/2021 0946   UROBILINOGEN negative 06/17/2015 0914   UROBILINOGEN 0.2 01/05/2013 1447   NITRITE NEGATIVE 08/26/2021 0946   LEUKOCYTESUR NEGATIVE 08/26/2021 0946     Dorn Hartshorne M.D. [facility] 08/27/2021, 12:40 PM  Available via Epic secure chat 7am-7pm After 7 pm, please refer to night coverage provider listed on amion.

## 2021-08-27 NOTE — Progress Notes (Signed)
HF TOC CSW met with patient at bedside. CSW discussed the [facility] program and provided patient with appointment card for Tuesday October 25 at 11am. Patient states she has supportive neighbors who can assist with transportation if needed but she is hopeful to drive herself. Patient works full time and asked appropriate questions regarding future disability/insurance needs. CSW will follow up with patient in the [facility] clinic next week. Patient verbalizes understanding of plan for follow up in HF TOC. Raquel Sarna, [place], [place]

## 2021-08-27 NOTE — Progress Notes (Signed)
  Echocardiogram 2D Echocardiogram has been performed.  Vicki Wheeler F 08/27/2021, 1:35 PM

## 2021-08-27 NOTE — Evaluation (Signed)
Physical Therapy Evaluation Patient Details Name: Vicki Wheeler MRN: [id number] DOB: 10/18/1956 Today's Date: 08/27/2021  History of Present Illness  Pt is a 65 y.o. female who presented 08/26/21 with SOB and afib. Pt found to be volume overloaded secondary to acute diastolic CHF. PMH: paroxysmal atrial fibrillation, hypertension, diabetes mellitus type 2, anxiety, depression, and morbid obesity   Clinical Impression  Pt presents with condition above and deficits mentioned below, see PT Problem List. PTA, she was mod I with functional mobility intermittently using either her SPC, quad cane, or no AD. Pt lives alone in a 1-level apartment with 2 STE. Pt reports she is having difficulty reaching to perform dressing, pericare, and bathing tasks and reports needing increased time for all of these. Currently, pt is displaying deficits in activity tolerance, aerobic endurance, and balance. Pt required use of her SPC and min guard assist for safety as she displayed intermittent trunk sway while ambulating up to ~220 ft this date. See General Comments below in regards to SpO2 levels during session. Expect pt will quickly return to baseline as she continues to mobilize and as her aerobic endurance improves. Encouraged pt to mobilize with staff often. Will continue to follow acutely.     Recommendations for follow up therapy are one component of a multi-disciplinary discharge planning process, led by the attending physician.  Recommendations may be updated based on patient status, additional functional criteria and insurance authorization.  Follow Up Recommendations No PT follow up    Equipment Recommendations  Other (comment) (shower chair)    Recommendations for Other Services OT consult     Precautions / Restrictions Precautions Precautions: Other (comment) Precaution Comments: monitor SpO2 Restrictions Weight Bearing Restrictions: No      Mobility  Bed Mobility Overal bed mobility:  Modified Independent             General bed mobility comments: Pt able to perform all bed mobility aspects using bed rails with HOB elevated without assistance.    Transfers Overall transfer level: Independent Equipment used: None             General transfer comment: Pt able to power up to stand from EOB without LOB or safety concerns.  Ambulation/Gait Ambulation/Gait assistance: Min guard Gait Distance (Feet): 220 Feet Assistive device: Straight cane Gait Pattern/deviations: Step-through pattern;Decreased stride length;Wide base of support Gait velocity: reduced Gait velocity interpretation: <1.31 ft/sec, indicative of household ambulator General Gait Details: Pt with slow gait and wide BOS, displaying intermittent lateral trunk sway, min guard assist for safety. Pt reports her balance is impaired compared to her baseline. x1 standing rest break to cue pt on pursed lip breathing for sats to rebound  Stairs            Wheelchair Mobility    Modified Rankin (Stroke Patients Only)       Balance Overall balance assessment: Needs assistance Sitting-balance support: No upper extremity supported;Feet supported Sitting balance-Leahy Scale: Good     Standing balance support: Single extremity supported;No upper extremity supported;During functional activity Standing balance-Leahy Scale: Fair Standing balance comment: Able to stand statically and take a couple steps without UE support but benefits from 1 UE support for mobility balance.                             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 Pt drives. Interchanges use of SPC and quad canes and no AD. Holds onto grab bars in showers. Reports she is able to dress and bathe self but has difficulty reaching and it takes extensive amounts of time. Denies any recent falls.     Hand Dominance   Dominant Hand: Right    Extremity/Trunk Assessment   Upper Extremity Assessment Upper Extremity Assessment: Overall WFL for tasks assessed (denies numbness/tingling; MMT scores of 4+ to 5 grossly bil)    Lower Extremity Assessment Lower Extremity Assessment: Overall WFL for tasks assessed (denies numbness/tingling; MMT scores of 4 to 5 grossly bil)    Cervical / Trunk Assessment Cervical / Trunk Assessment: Other exceptions Cervical / Trunk Exceptions: increased body habitus  Communication   Communication: No difficulties  Cognition Arousal/Alertness: Awake/alert Behavior During Therapy: WFL for tasks assessed/performed Overall Cognitive Status: Within Functional Limits for tasks assessed                                        General Comments General comments (skin integrity, edema, etc.): lowest of 83% on RA at rest, 79% on RA when ambulating, 90% on 2L when ambulating, 92% on 1L at rest end of session    Exercises     Assessment/Plan    PT Assessment Patient needs continued PT services  PT Problem List Decreased range of motion;Decreased activity tolerance;Decreased balance;Decreased mobility;Cardiopulmonary status limiting activity;Obesity       PT Treatment Interventions DME instruction;Gait training;Stair training;Functional mobility training;Therapeutic activities;Therapeutic exercise;Balance training;Neuromuscular re-education;Patient/family education    PT Goals (Current  goals can be found in the Care Plan section)  Acute Rehab PT Goals Patient Stated Goal: to get back to normal PT Goal Formulation: With patient Time For Goal Achievement: 09/10/21 Potential to Achieve Goals: Good    Frequency Min 3X/week   Barriers to discharge        Co-evaluation               AM-PAC PT "6 Clicks" Mobility  Outcome Measure Help needed turning from your back to your side while in a flat bed without using bedrails?: None Help needed moving from lying on your back to sitting on the side of a flat bed without using bedrails?: None Help needed moving to and from a bed to a chair (including a wheelchair)?: None Help needed standing up from a chair using your arms (e.g., wheelchair or bedside chair)?: None Help needed to walk in hospital room?: A Little Help needed climbing 3-5 steps with a railing? : A Little 6 Click Score: 22    End of Session Equipment Utilized During Treatment: Oxygen Activity Tolerance: Patient tolerated treatment well Patient left: in chair;with call bell/phone within reach   PT Visit Diagnosis: Unsteadiness on feet (R26.81);Other abnormalities of gait and mobility (R26.89);Difficulty in walking, not elsewhere classified (R26.2)    Time: 2094-7096 PT Time Calculation (min) (ACUTE ONLY): 31 min   Charges:   PT Evaluation $PT Eval Moderate Complexity: 1  Mod PT Treatments $Gait Training: 8-22 mins        Moishe Spice, PT, DPT Acute Rehabilitation Services  Pager: [phone] Office: [place] 08/27/2021, 12:32 PM

## 2021-08-27 NOTE — Progress Notes (Addendum)
Progress Note  Patient Name: Vicki Wheeler Date of Encounter: 08/27/2021  [facility] Cardiologist: Sanda Klein, MD   Subjective   Breathing better. No CP.   Inpatient Medications    Scheduled Meds:  amiodarone  100 mg Oral Daily   amLODipine  10 mg Oral Daily   apixaban  5 mg Oral BID   atenolol  100 mg Oral BID   atorvastatin  10 mg Oral Daily   clotrimazole  1 application Topical QODAY   furosemide  40 mg Intravenous BID   insulin aspart  0-9 Units Subcutaneous TID WC   levothyroxine  50 mcg Oral Daily   lisinopril  20 mg Oral Daily   potassium chloride  20 mEq Oral BID   sodium chloride flush  3 mL Intravenous Q12H   Continuous Infusions:  sodium chloride     PRN Meds: sodium chloride, acetaminophen, albuterol, diltiazem, sodium chloride flush   Vital Signs    Vitals:   08/27/21 0051 08/27/21 0510 08/27/21 0849 08/27/21 1056  BP: (!) 152/84 (!) 148/74 127/76 112/67  Pulse: (!) 104 97 94 84  Resp: 18 18 19 19   Temp: 100.2 F (37.9 C) 98.6 F (37 C) 99.1 F (37.3 C) 99.1 F (37.3 C)  TempSrc: Oral Oral Oral Oral  SpO2: 91% 92% (!) 86% (!) 88%  Weight:  (!) 157.3 kg    Height:        Intake/Output Summary (Last 24 hours) at 08/27/2021 1244 Last data filed at 08/27/2021 1059 Gross per 24 hour  Intake 50 ml  Output 4050 ml  Net -4000 ml   Last 3 Weights 08/27/2021 08/26/2021 02/17/2021  Weight (lbs) 346 lb 12.8 oz 353 lb 357 lb  Weight (kg) 157.307 kg 160.12 kg 161.934 kg      Telemetry    Atrial flutter with HR 90s - Personally Reviewed  ECG     - Personally Reviewed  Physical Exam   GEN: No acute distress.   Neck: No JVD Cardiac: irregular, no murmurs, rubs, or gallops.  Respiratory: Clear to auscultation bilaterally. GI: Soft, nontender, non-distended  MS: trace leg edema; No deformity. Neuro:  Nonfocal  Psych: Normal affect   Labs    High Sensitivity Troponin:   Recent Labs  Lab 08/26/21 0414 08/26/21 0550   TROPONINIHS 10 12     Chemistry Recent Labs  Lab 08/26/21 0414 08/27/21 0401  NA 136 141  K 3.7 3.4*  CL 97* 99  CO2 28 31  GLUCOSE 159* 134*  BUN 8 9  CREATININE 0.88 0.99  CALCIUM 9.2 8.7*  PROT  --  7.2  ALBUMIN  --  2.9*  AST  --  16  ALT  --  17  ALKPHOS  --  66  BILITOT  --  1.4*  GFRNONAA >60 >60  ANIONGAP 11 11    Lipids No results for input(s): CHOL, TRIG, HDL, LABVLDL, LDLCALC, CHOLHDL in the last 168 hours.  Hematology Recent Labs  Lab 08/26/21 0414 08/27/21 0401  WBC 12.4* 10.0  RBC 4.09 4.01  HGB 12.0 11.9*  HCT 38.4 37.1  MCV 93.9 92.5  MCH 29.3 29.7  MCHC 31.3 32.1  RDW 14.5 14.2  PLT 233 209   Thyroid  Recent Labs  Lab 08/26/21 0834  TSH 1.517    BNP Recent Labs  Lab 08/26/21 0414  BNP 537.8*    DDimer No results for input(s): DDIMER in the last 168 hours.   Radiology    DG  Chest 2 View  Result Date: 08/26/2021 CLINICAL DATA:  Reason for exam: sob BIB [facility] for SOB x 4 days. Worse with exertion. Lung sounds clear and equal bilat, per pt stomach bug recently. PIV 18ga Lt AC. [place] 4lpm RA sat 86%. EXAM: CHEST - 2 VIEW COMPARISON:  10/24/2017 FINDINGS: New extensive somewhat granular airspace opacities throughout the right lung and on the left predominantly at the lung base. Heart size upper limits normal as before. Implanted event monitor overlies the left lower chest. No effusion.  No pneumothorax. Visualized bones unremarkable. IMPRESSION: Extensive asymmetric infiltrates or edema, right worse than left. Electronically Signed   By: Lucrezia Europe M.D.   On: 08/26/2021 05:17    Cardiac Studies   Echo 10/26/2017 LV EF: 60% -   65%  Study Conclusions   - Left ventricle: The cavity size was normal. Systolic function was    normal. The estimated ejection fraction was in the range of 60%    to 65%. Wall motion was normal; there were no regional wall    motion abnormalities. The study is not technically sufficient to    allow evaluation of  LV diastolic function.  - Aortic valve: Valve area (VTI): 2.55 cm^2. Valve area (Vmax):    2.48 cm^2. Valve area (Vmean): 2.63 cm^2.  - Mitral valve: There was mild regurgitation. Valve area by    continuity equation (using LVOT flow): 2.3 cm^2.  - Left atrium: The atrium was moderately dilated.  - Atrial septum: A patent foramen ovale cannot be excluded.   Patient Profile     65 y.o. female with PMH of PAF, HTN, DM II, anxiety/depression, and morbid obesity presented on 08/26/2021 with increased dyspnea and was diagnosed of acute diastolic CHF due to dietary indiscretion.   Assessment & Plan    Acute diastolic heart failure  - on 40mg  BID IV lasix. I/O -5L.   - on exam, near euvolemic level. Lung is clear. Trace of amount of edema in the leg.   - Pending echocardiogram  - likely 1 more dose of IV lasix this afternoon and can switch to PO lasix starting tomorrow.   Paroxysmal atrial flutter s/p ablation 01/2017: also has atypical atrial flutter and afib. Was in [place] in March, back in sinus rhythm based on her apple watch by Apr 2022. In sinus rhythm on arrival yesterday, now back in atrial flutter with HR 90s to low 100s. Continue Eliquis  - per previous cardiology note by Dr. Sallyanne Kuster on 02/17/2021, patient appears to be asymptomatic while in atrial flutter. As long as her HR continue to improve, no urgent need for cardioversion.   Hypoxic respiratory failure  - per PT who ambulated the patient earlier, O2 at on room air at rest was 83%, during ambulation 79%. Currently on 2L oxygen, may need home O2 on discharge  HTN  DM II  Morbid obesity      For questions or updates, please contact [place] Please consult [email or web address] for contact info under        Signed, Almyra Deforest, [place]  08/27/2021, 12:44 PM    Attending Note:   The patient was seen and examined.  Agree with assessment and plan as noted above.  Changes made to the above note as needed.  Patient seen and  independently examined with Almyra Deforest, PA.   We discussed all aspects of the encounter. I agree with the assessment and plan as stated above.   1.  Diastolic congestive heart failure: She  is diuresed very well on IV Lasix.  I suspect she is very close to being euvolemic.  Agree with starting oral Lasix tomorrow.  2.  Paroxysmal atrial flutter.  Continue Eliquis.  She remains fairly asymptomatic while in atrial flutter.  3.  Hypoxic respiratory failure: Patient has some degree of obesity hypoventilation.  She may need oxygen upon discharge.  Will defer to the primary team.   I have spent a total of 40 minutes with patient reviewing hospital  notes , telemetry, EKGs, labs and examining patient as well as establishing an assessment and plan that was discussed with the patient.  > 50% of time was spent in direct patient care.    Thayer Headings, Brooke Bonito., MD, [facility] 08/27/2021, 2:40 PM [address]  [place] Pager [phone]

## 2021-08-27 NOTE — Progress Notes (Signed)
SATURATION QUALIFICATIONS: (This note is used to comply with regulatory documentation for home oxygen)  Patient Saturations on Room Air at Rest = 83%  Patient Saturations on Room Air while Ambulating = 79%  Patient Saturations on 2 Liters of oxygen while Ambulating = 90%  Please briefly explain why patient needs home oxygen: Pt's sats decrease when on RA.    Moishe Spice, PT, DPT Acute Rehabilitation Services  Pager: [phone] Office: [phone]

## 2021-08-27 NOTE — Progress Notes (Signed)
   HF Navigation Team   Admitted with A/C preserved EF. She is interested in the HF [facility] clinic.   I set up an appointment for HF TOC. Appointment placed on the chart.   Lives alone.    Desmon Hitchner NP-C  9:50 AM

## 2021-08-28 DIAGNOSIS — I5033 Acute on chronic diastolic (congestive) heart failure: Secondary | ICD-10-CM | POA: Diagnosis not present

## 2021-08-28 LAB — BASIC METABOLIC PANEL
Anion gap: 9 (ref 5–15)
BUN: 12 mg/dL (ref 8–23)
CO2: 33 mmol/L — ABNORMAL HIGH (ref 22–32)
Calcium: 8.5 mg/dL — ABNORMAL LOW (ref 8.9–10.3)
Chloride: 99 mmol/L (ref 98–111)
Creatinine, Ser: 0.98 mg/dL (ref 0.44–1.00)
GFR, Estimated: >60 mL/min (ref >60)
Glucose, Bld: 132 mg/dL — ABNORMAL HIGH (ref 70–99)
Potassium: 3.6 mmol/L (ref 3.5–5.1)
Sodium: 141 mmol/L (ref 135–145)

## 2021-08-28 LAB — GLUCOSE, CAPILLARY
Glucose-Capillary: 156 mg/dL — ABNORMAL HIGH (ref 70–99)
Glucose-Capillary: 132 mg/dL — ABNORMAL HIGH (ref 70–99)
Glucose-Capillary: 117 mg/dL — ABNORMAL HIGH (ref 70–99)
Glucose-Capillary: 125 mg/dL — ABNORMAL HIGH (ref 70–99)
Glucose-Capillary: 102 mg/dL — ABNORMAL HIGH (ref 70–99)
Glucose-Capillary: 138 mg/dL — ABNORMAL HIGH (ref 70–99)

## 2021-08-28 MED ORDER — TORSEMIDE 20 MG PO TABS
20.0000 mg | ORAL_TABLET | Freq: Every day | ORAL | Status: DC
  Administered 2021-08-28 – 2021-08-29 (×2): 20 mg via ORAL
  Filled 2021-08-28 (×2): qty 1

## 2021-08-28 NOTE — Plan of Care (Signed)

## 2021-08-28 NOTE — Evaluation (Signed)
Occupational Therapy Evaluation and Discharge Patient Details Name: Vicki Wheeler MRN: [id number] DOB: 02/02/1956 Today's Date: 08/28/2021   History of Present Illness Pt is a 65 y.o. female who presented 08/26/21 with SOB and afib. Pt found to be volume overloaded secondary to acute diastolic CHF. PMH: paroxysmal atrial fibrillation, hypertension, diabetes mellitus type 2, anxiety, depression, and morbid obesity   Clinical Impression   Pt is functioning modified independently in ADL. Educated in [place] for pericare and in energy conservation strategies. Pt receptive. No further OT needs.     Recommendations for follow up therapy are one component of a multi-disciplinary discharge planning process, led by the attending physician.  Recommendations may be updated based on patient status, additional functional criteria and insurance authorization.   Follow Up Recommendations  No OT follow up    Equipment Recommendations  None recommended by OT    Recommendations for Other Services       Precautions / Restrictions Precautions Precautions: None      Mobility Bed Mobility               General bed mobility comments: up in chair    Transfers Overall transfer level: Independent Equipment used: None                  Balance Overall balance assessment: Needs assistance   Sitting balance-Leahy Scale: Good       Standing balance-Leahy Scale: Fair                             ADL either performed or assessed with clinical judgement   ADL Overall ADL's : Modified independent                                       General ADL Comments: Educated in availability of toilet aid, recommended seated showering for energy conservation.     Vision Baseline Vision/History: 1 Wears glasses Ability to See in Adequate Light: 0 Adequate Patient Visual Report: No change from baseline       Perception     Praxis      Pertinent Vitals/Pain  Pain Assessment: No/denies pain     Hand Dominance Right   Extremity/Trunk Assessment Upper Extremity Assessment Upper Extremity Assessment: Overall WFL for tasks assessed   Lower Extremity Assessment Lower Extremity Assessment: Defer to PT evaluation   Cervical / Trunk Assessment Cervical / Trunk Assessment: Other exceptions Cervical / Trunk Exceptions: increased body habitus   Communication Communication Communication: No difficulties   Cognition Arousal/Alertness: Awake/alert Behavior During Therapy: WFL for tasks assessed/performed Overall Cognitive Status: Within Functional Limits for tasks assessed                                     General Comments       Exercises     Shoulder Instructions      Home Living Family/patient expects to be discharged to:: Private residence Living Arrangements: Alone Available Help at Discharge: Family;Neighbor;Available 24 hours/day Type of Home: Apartment Home Access: Stairs to enter Entrance Stairs-Number of Steps: 2 Entrance Stairs-Rails: None Home Layout: One level     Bathroom Shower/Tub: Teacher, early years/pre: Handicapped height     Home Equipment: Grab bars - tub/shower;Hand held shower head;Cane - single point;Cane -  quad;Adaptive equipment Adaptive Equipment: Reacher        Prior Functioning/Environment Level of Independence: Independent        Comments: 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 Pt drives. Interchanges use of SPC and quad canes and no AD. Holds onto grab bars in showers. Reports she is able to dress and bathe self but has difficulty reaching and it takes extensive amounts of time. Denies any recent falls.        OT Problem List:        OT Treatment/Interventions:      OT Goals(Current goals can be found in the care plan section) Acute Rehab OT Goals Patient Stated Goal: to get back to normal  OT Frequency:     Barriers to D/C:             Co-evaluation              AM-PAC OT "6 Clicks" Daily Activity     Outcome Measure Help from another person eating meals?: None Help from another person taking care of personal grooming?: None Help from another person toileting, which includes using toliet, bedpan, or urinal?: None Help from another person bathing (including washing, rinsing, drying)?: None Help from another person to put on and taking off regular upper body clothing?: None Help from another person to put on and taking off regular lower body clothing?: None 6 Click Score: 24   End of Session    Activity Tolerance: Patient tolerated treatment well Patient left: in chair;with call bell/phone within reach  OT Visit Diagnosis: Other abnormalities of gait and mobility (R26.89)                Time: 1696-7893 OT Time Calculation (min): 49 min Charges:  OT General Charges $OT Visit: 1 Visit OT Evaluation $OT Eval Low Complexity: 1 Low OT Treatments $Self Care/Home Management : 23-37 mins  Nestor Lewandowsky, OTR/L Acute Rehabilitation Services Pager: [phone] Office: [phone]  Malka So 08/28/2021, 11:27 AM

## 2021-08-28 NOTE — Progress Notes (Addendum)
[facility]                                                                              Patient Demographics  Vicki Wheeler, is a 65 y.o. female, DOB - 1955-12-30, WVP:[id number]  Admit date - 08/26/2021   Admitting Physician Kristopher Oppenheim, DO  Outpatient Primary MD for the patient is Shanon Rosser, PA-C  Outpatient specialists:   LOS - 2  days   Medical records reviewed and are as summarized below:    Chief Complaint  Patient presents with   Shortness of Breath       Brief summary   Patient is a 65 year old female with paroxysmal A. fib, hypertension, diabetes mellitus type 2, anxiety, depression, obesity presented with difficulty breathing.  Patient reported feeling unwell approximately 5 days ago prior to admission, initially attributed her symptoms to working too much.  Per patient, she had urinary frequency, diarrhea, symptoms of 'stomach virus' that seemed to resolve within 24 hours.  Patient also reported that at her workplace, 4 colleagues had recently been COVID-positive.  She tested herself and was negative.  During this time, she started having progressively worsening shortness of breath.  At baseline, lives alone, sleeps sitting up for last several years due to reflux, does not require O2.  However she noticed, she had worsening shortness of breath on ambulating and had to rest to catch her breath.  No fevers or chills, did report productive cough with clear sputum, intermittent wheezing with activity and leg swelling. In ED, patient was found to be hypoxic with O2 sats 82% on room air, improved on 4 L. BNP 537.8, chest x-ray showed pulm edema.  Assessment & Plan    Principal Problem: Acute respiratory failure with hypoxia secondary to acute diastolic congestive heart failure ([place]) -Not on O2 at home, requiring 4 L of O2 on admission, currently O2 off.  Was hypoxic with ambulation yesterday with lowest O2 sats 79% on ambulation.  Possibly has  underlying obesity hypoventilation. -Patient was placed on IV Lasix for diuresis, negative balance of 7.2 L --Weight 353.3lbs on admission, improved to 341 today -Norvasc discontinued, (patient reported significant ankle swelling after starting this medication outpatient) -Renal function stable, transitioned to oral torsemide -Home O2 evaluation prior to discharge. -2D echo showed EF of 55 to 60%, mild LVH, indeterminate diastolic parameters  Active Problems:   Essential hypertension -BP stable, continue beta-blocker, lisinopril, torsemide    Dyslipidemia -Continue atorvastatin    Paroxysmal atrial fibrillation (HCC) -Heart rate currently controlled, continue beta-blocker, amiodarone - CHA2DS2-VASc 4, continue Eliquis    Diabetes mellitus type 2 in obese ([place]), NIDDM, with hyperlipidemia -Hold metformin, hemoglobin A1c 6.4 -Continue sliding scale insulin Recent Labs    08/27/21 0532 08/27/21 1055 08/27/21 1658 08/27/21 2048 08/28/21 0440 08/28/21 1206  GLUCAP 124* 102* 81 132* 117* 125*       Hypothyroidism -TSH 1.5, continue Synthroid    Prolonged QT interval -Avoid QT prolonging meds -Potassium replaced  Hypokalemia -Resolved    Morbid obesity with BMI of 50.0-59.9, adult (HCC) Estimated body mass index is 56.9 kg/m as calculated from the following:   Height as  of this encounter: 5\' 5"  (1.651 m).   Weight as of this encounter: 155.1 kg. -Discussed about weight and dietary changes  Code Status: Full CODE STATUS DVT Prophylaxis:   apixaban (ELIQUIS) tablet 5 mg   Level of Care: Level of care: Progressive Family Communication: Discussed all imaging results, lab results, explained to the patient   Disposition Plan:     Status is: Inpatient  Remains inpatient appropriate because: Transition to oral torsemide today, likely DC home in a.m. Will need home O2 evaluation prior to discharge  Time Spent in minutes 25 minutes Procedures:  None  Consultants:    Cardiology  Antimicrobials:   Anti-infectives (From admission, onward)    None          Medications  Scheduled Meds:  amiodarone  100 mg Oral Daily   apixaban  5 mg Oral BID   atenolol  100 mg Oral BID   atorvastatin  10 mg Oral Daily   clotrimazole  1 application Topical QODAY   insulin aspart  0-9 Units Subcutaneous TID WC   levothyroxine  50 mcg Oral Daily   lisinopril  20 mg Oral Daily   potassium chloride  20 mEq Oral BID   sodium chloride flush  3 mL Intravenous Q12H   torsemide  20 mg Oral Daily   Continuous Infusions:  sodium chloride     PRN Meds:.sodium chloride, acetaminophen, albuterol, diltiazem, sodium chloride flush      Subjective:   Vicki Wheeler was seen and examined today.  Feeling better today, O2 sats 92 to 93% on room air at rest.  Has not ambulated yet today.  No chest pain, lightheadedness or dizziness.  No fevers  Objective:   Vitals:   08/28/21 0400 08/28/21 0442 08/28/21 0801 08/28/21 1142  BP: 130/69  (!) 124/59 119/78  Pulse: 87  93 (!) 59  Resp: 19  18 17   Temp: 98.3 F (36.8 C)  98.3 F (36.8 C) 98 F (36.7 C)  TempSrc: Oral  Oral Oral  SpO2: 98%  92% 93%  Weight:  (!) 155.1 kg    Height:        Intake/Output Summary (Last 24 hours) at 08/28/2021 1505 Last data filed at 08/28/2021 0500 Gross per 24 hour  Intake --  Output 2600 ml  Net -2600 ml     Wt Readings from Last 3 Encounters:  08/28/21 (!) 155.1 kg  02/17/21 (!) 161.9 kg  09/19/20 (!) 160.7 kg   Physical Exam General: Alert and oriented x 3, NAD Cardiovascular: S1 S2 clear, RRR. No pedal edema b/l Respiratory: CTAB, no wheezing, rales or rhonchi Gastrointestinal: Soft, nontender, nondistended, NBS Ext: no pedal edema bilaterally Neuro: no new deficits   Data Reviewed:  I have personally reviewed following labs and imaging studies  Micro Results Recent Results (from the past 240 hour(s))  Resp Panel by RT-PCR (Flu A&B, Covid) Nasopharyngeal  Swab     Status: None   Collection Time: 08/26/21  4:09 AM   Specimen: Nasopharyngeal Swab; Nasopharyngeal(NP) swabs in vial transport medium  Result Value Ref Range Status   SARS Coronavirus 2 by RT PCR NEGATIVE NEGATIVE Final    Comment: (NOTE) SARS-CoV-2 target nucleic acids are NOT DETECTED.  The SARS-CoV-2 RNA is generally detectable in upper respiratory specimens during the acute phase of infection. The lowest concentration of SARS-CoV-2 viral copies this assay can detect is 138 copies/mL. A negative result does not preclude SARS-Cov-2 infection and should not be used as the sole  basis for treatment or other patient management decisions. A negative result may occur with  improper specimen collection/handling, submission of specimen other than nasopharyngeal swab, presence of viral mutation(s) within the areas targeted by this assay, and inadequate number of viral copies(<138 copies/mL). A negative result must be combined with clinical observations, patient history, and epidemiological information. The expected result is Negative.  Fact Sheet for Patients:  EntrepreneurPulse.com.au  Fact Sheet for Healthcare Providers:  IncredibleEmployment.be  This test is no t yet approved or cleared by the [place] FDA and  has been authorized for detection and/or diagnosis of SARS-CoV-2 by FDA under an Emergency Use Authorization (EUA). This EUA will remain  in effect (meaning this test can be used) for the duration of the COVID-19 declaration under Section 564(b)(1) of the Act, 21 U.S.C.section 360bbb-3(b)(1), unless the authorization is terminated  or revoked sooner.       Influenza A by PCR NEGATIVE NEGATIVE Final   Influenza B by PCR NEGATIVE NEGATIVE Final    Comment: (NOTE) The Xpert Xpress SARS-CoV-2/FLU/RSV plus assay is intended as an aid in the diagnosis of influenza from Nasopharyngeal swab specimens and should not be used as a sole  basis for treatment. Nasal washings and aspirates are unacceptable for Xpert Xpress SARS-CoV-2/FLU/RSV testing.  Fact Sheet for Patients: EntrepreneurPulse.com.au  Fact Sheet for Healthcare Providers: IncredibleEmployment.be  This test is not yet approved or cleared by the [place] FDA and has been authorized for detection and/or diagnosis of SARS-CoV-2 by FDA under an Emergency Use Authorization (EUA). This EUA will remain in effect (meaning this test can be used) for the duration of the COVID-19 declaration under Section 564(b)(1) of the Act, 21 U.S.C. section 360bbb-3(b)(1), unless the authorization is terminated or revoked.  Performed at [facility], [place] [address] [place], [place] [address]     Radiology Reports DG Chest 2 View  Result Date: 08/26/2021 CLINICAL DATA:  Reason for exam: sob BIB [facility] for SOB x 4 days. Worse with exertion. Lung sounds clear and equal bilat, per pt stomach bug recently. PIV 18ga Lt AC. [place] 4lpm RA sat 86%. EXAM: CHEST - 2 VIEW COMPARISON:  10/24/2017 FINDINGS: New extensive somewhat granular airspace opacities throughout the right lung and on the left predominantly at the lung base. Heart size upper limits normal as before. Implanted event monitor overlies the left lower chest. No effusion.  No pneumothorax. Visualized bones unremarkable. IMPRESSION: Extensive asymmetric infiltrates or edema, right worse than left. Electronically Signed   By: Lucrezia Europe M.D.   On: 08/26/2021 05:17   ECHOCARDIOGRAM COMPLETE  Result Date: 08/27/2021    ECHOCARDIOGRAM REPORT   Patient Name:   Vicki Wheeler Date of Exam: 08/27/2021 Medical Rec #:  [id number]         Height:       65.0 in Accession #:    [id number]        Weight:       346.8 lb Date of Birth:  Jul 26, 1956         BSA:          2.499 m Patient Age:    19 years          BP:           112/64 mmHg Patient Gender: F                 HR:           104 bpm. Exam  Location:  Inpatient  Procedure: 2D Echo, Cardiac Doppler and Color Doppler Indications:    CHF-Acute Diastolic O13.08  History:        Patient has prior history of Echocardiogram examinations, most                 recent 10/26/2017. Risk Factors:Morbid obesity.  Sonographer:    Merrie Roof RDCS Referring Phys: [id number] [place]  1. Left ventricular ejection fraction, by estimation, is 55 to 60%. The left ventricle has normal function. The left ventricle has no regional wall motion abnormalities. There is mild left ventricular hypertrophy. Left ventricular diastolic parameters are indeterminate.  2. Right ventricular systolic function is normal. The right ventricular size is normal. Tricuspid regurgitation signal is inadequate for assessing PA pressure.  3. Left atrial size was mildly dilated.  4. Right atrial size was mildly dilated.  5. The mitral valve is normal in structure. Trivial mitral valve regurgitation. No evidence of mitral stenosis.  6. The aortic valve is tricuspid. Aortic valve regurgitation is not visualized. Mild aortic valve sclerosis is present, with no evidence of aortic valve stenosis.  7. The inferior vena cava is normal in size with greater than 50% respiratory variability, suggesting right atrial pressure of 3 mmHg.  8. Technically difficult study with poor acoustic windows. FINDINGS  Left Ventricle: Left ventricular ejection fraction, by estimation, is 55 to 60%. The left ventricle has normal function. The left ventricle has no regional wall motion abnormalities. The left ventricular internal cavity size was normal in size. There is  mild left ventricular hypertrophy. Left ventricular diastolic parameters are indeterminate. Right Ventricle: The right ventricular size is normal. No increase in right ventricular wall thickness. Right ventricular systolic function is normal. Tricuspid regurgitation signal is inadequate for assessing PA pressure. Left Atrium: Left atrial size  was mildly dilated. Right Atrium: Right atrial size was mildly dilated. Pericardium: There is no evidence of pericardial effusion. Mitral Valve: The mitral valve is normal in structure. Mild mitral annular calcification. Trivial mitral valve regurgitation. No evidence of mitral valve stenosis. Tricuspid Valve: The tricuspid valve is normal in structure. Tricuspid valve regurgitation is not demonstrated. Aortic Valve: The aortic valve is tricuspid. Aortic valve regurgitation is not visualized. Mild aortic valve sclerosis is present, with no evidence of aortic valve stenosis. Aortic valve mean gradient measures 4.0 mmHg. Aortic valve peak gradient measures 7.8 mmHg. Aortic valve area, by VTI measures 2.45 cm. Pulmonic Valve: The pulmonic valve was not well visualized. Pulmonic valve regurgitation is not visualized. Aorta: The aortic root is normal in size and structure. Venous: The inferior vena cava is normal in size with greater than 50% respiratory variability, suggesting right atrial pressure of 3 mmHg. IAS/Shunts: No atrial level shunt detected by color flow Doppler.  LEFT VENTRICLE PLAX 2D LVIDd:         4.90 cm LVIDs:         3.60 cm LV PW:         1.30 cm LV IVS:        1.10 cm LVOT diam:     1.80 cm LV SV:         50 LV SV Index:   20 LVOT Area:     2.54 cm  RIGHT VENTRICLE RV Basal diam:  4.00 cm LEFT ATRIUM             Index        RIGHT ATRIUM           Index LA  diam:        5.20 cm 2.08 cm/m   RA Area:     21.30 cm LA Vol (A2C):   77.4 ml 30.97 ml/m  RA Volume:   59.20 ml  23.68 ml/m LA Vol (A4C):   91.3 ml 36.53 ml/m LA Biplane Vol: 88.3 ml 35.33 ml/m  AORTIC VALVE AV Area (Vmax):    2.22 cm AV Area (Vmean):   2.28 cm AV Area (VTI):     2.45 cm AV Vmax:           140.00 cm/s AV Vmean:          95.000 cm/s AV VTI:            0.205 m AV Peak Grad:      7.8 mmHg AV Mean Grad:      4.0 mmHg LVOT Vmax:         122.00 cm/s LVOT Vmean:        85.000 cm/s LVOT VTI:          0.197 m LVOT/AV VTI ratio:  0.96  AORTA Ao Root diam: 2.40 cm Ao Asc diam:  3.20 cm  SHUNTS Systemic VTI:  0.20 m Systemic Diam: 1.80 cm Dalton McleanMD Electronically signed by Franki Monte Signature Date/Time: 08/27/2021/8:06:01 PM    Final     Lab Data:  CBC: Recent Labs  Lab 08/26/21 0414 08/27/21 0401  WBC 12.4* 10.0  NEUTROABS 10.2* 7.4  HGB 12.0 11.9*  HCT 38.4 37.1  MCV 93.9 92.5  PLT 233 408   Basic Metabolic Panel: Recent Labs  Lab 08/26/21 0414 08/27/21 0401 08/28/21 0316  NA 136 141 141  K 3.7 3.4* 3.6  CL 97* 99 99  CO2 28 31 33*  GLUCOSE 159* 134* 132*  BUN 8 9 12   CREATININE 0.88 0.99 0.98  CALCIUM 9.2 8.7* 8.5*   GFR: Estimated Creatinine Clearance: 89.2 mL/min (by C-G formula based on SCr of 0.98 mg/dL). Liver Function Tests: Recent Labs  Lab 08/27/21 0401  AST 16  ALT 17  ALKPHOS 66  BILITOT 1.4*  PROT 7.2  ALBUMIN 2.9*   No results for input(s): LIPASE, AMYLASE in the last 168 hours. No results for input(s): AMMONIA in the last 168 hours. Coagulation Profile: No results for input(s): INR, PROTIME in the last 168 hours. Cardiac Enzymes: No results for input(s): CKTOTAL, CKMB, CKMBINDEX, TROPONINI in the last 168 hours. BNP (last 3 results) No results for input(s): PROBNP in the last 8760 hours. HbA1C: Recent Labs    08/27/21 0401  HGBA1C 6.0*   CBG: Recent Labs  Lab 08/27/21 1055 08/27/21 1658 08/27/21 2048 08/28/21 0440 08/28/21 1206  GLUCAP 102* 81 132* 117* 125*   Lipid Profile: No results for input(s): CHOL, HDL, LDLCALC, TRIG, CHOLHDL, LDLDIRECT in the last 72 hours. Thyroid Function Tests: Recent Labs    08/26/21 0834  TSH 1.517   Anemia Panel: No results for input(s): VITAMINB12, FOLATE, FERRITIN, TIBC, IRON, RETICCTPCT in the last 72 hours. Urine analysis:    Component Value Date/Time   COLORURINE STRAW (A) 08/26/2021 0946   APPEARANCEUR CLEAR 08/26/2021 0946   LABSPEC 1.005 08/26/2021 0946   PHURINE 7.0 08/26/2021 0946   GLUCOSEU  NEGATIVE 08/26/2021 0946   HGBUR SMALL (A) 08/26/2021 0946   BILIRUBINUR NEGATIVE 08/26/2021 0946   BILIRUBINUR n 06/17/2015 0914   KETONESUR NEGATIVE 08/26/2021 0946   PROTEINUR NEGATIVE 08/26/2021 0946   UROBILINOGEN negative 06/17/2015 0914   UROBILINOGEN 0.2 01/05/2013 1447  NITRITE NEGATIVE 08/26/2021 0946   LEUKOCYTESUR NEGATIVE 08/26/2021 0946     Sophiamarie Nease M.D. [facility] 08/28/2021, 3:05 PM  Available via Epic secure chat 7am-7pm After 7 pm, please refer to night coverage provider listed on amion.   Well female complaining of Home meds and dog.

## 2021-08-28 NOTE — Progress Notes (Signed)
Physical Therapy Treatment Patient Details Name: Vicki Wheeler MRN: [id number] DOB: 1955-12-14 Today's Date: 08/28/2021   History of Present Illness Pt is a 65 y.o. female who presented 08/26/21 with SOB and afib. Pt found to be volume overloaded secondary to acute diastolic CHF. PMH: paroxysmal atrial fibrillation, hypertension, diabetes mellitus type 2, anxiety, depression, and morbid obesity    PT Comments    Pt up in chair off O2 upon PT arrival to room, SPO2 mid90. Pt tolerated >200 ft ambulation with use of cane and short standing rest breaks, SpO2 maintained 88% and greater on RA. Pt also proficiently navigated x2 steps with use of SPC. Pt progressing well, RN notified of no O2 needs this day. Will continue to follow.     Recommendations for follow up therapy are one component of a multi-disciplinary discharge planning process, led by the attending physician.  Recommendations may be updated based on patient status, additional functional criteria and insurance authorization.  Follow Up Recommendations  No PT follow up     Equipment Recommendations  Other (comment) (shower chair)    Recommendations for Other Services OT consult     Precautions / Restrictions Precautions Precautions: Other (comment) Precaution Comments: monitor SpO2 Restrictions Weight Bearing Restrictions: No     Mobility  Bed Mobility Overal bed mobility: Needs Assistance             General bed mobility comments: up in chair    Transfers Overall transfer level: Needs assistance Equipment used: Straight cane Transfers: Sit to/from Stand Sit to Stand: Modified independent (Device/Increase time)         General transfer comment: no physical assist  Ambulation/Gait Ambulation/Gait assistance: Modified independent (Device/Increase time) Gait Distance (Feet): 250 Feet Assistive device: Straight cane Gait Pattern/deviations: Step-through pattern;Decreased stride length;Wide base of  support Gait velocity: decr   General Gait Details: mod I for increased time, x3 standing rest breaks to recover fatigue and min dyspnea. SpO2 88% and greater on RA throughout gait.   Stairs Stairs: Yes Stairs assistance: Supervision Stair Management: No rails;Step to pattern;Forwards;With cane Number of Stairs: 2 General stair comments: no physical assist, supervision for safety with use of cane for support in LUE, tolerated well   Wheelchair Mobility    Modified Rankin (Stroke Patients Only)       Balance Overall balance assessment: Needs assistance   Sitting balance-Leahy Scale: Good       Standing balance-Leahy Scale: Fair                              Cognition Arousal/Alertness: Awake/alert Behavior During Therapy: WFL for tasks assessed/performed Overall Cognitive Status: Within Functional Limits for tasks assessed                                        Exercises      General Comments        Pertinent Vitals/Pain Pain Assessment: No/denies pain    Home Living Family/patient expects to be discharged to:: Private residence Living Arrangements: Alone Available Help at Discharge: Family;Neighbor;Available 24 hours/day Type of Home: Apartment Home Access: Stairs to enter Entrance Stairs-Rails: None Home Layout: One level Home Equipment: Grab bars - tub/shower;Hand held shower head;Cane - single point;Cane - quad;Adaptive equipment      Prior Function Level of Independence: Independent      Comments: Works in  registration and records as Warehouse manager. Pt drives. Interchanges use of SPC and quad canes and no AD. Holds onto grab bars in showers. Reports she is able to dress and bathe self but has difficulty reaching and it takes extensive amounts of time. Denies any recent falls.   PT Goals (current goals can now be found in the care plan section) Acute Rehab PT Goals Patient Stated Goal: to get back to normal PT  Goal Formulation: With patient Time For Goal Achievement: 09/10/21 Potential to Achieve Goals: Good Progress towards PT goals: Progressing toward goals    Frequency    Min 3X/week      PT Plan Current plan remains appropriate    Co-evaluation              AM-PAC PT "6 Clicks" Mobility   Outcome Measure  Help needed turning from your back to your side while in a flat bed without using bedrails?: None Help needed moving from lying on your back to sitting on the side of a flat bed without using bedrails?: None Help needed moving to and from a bed to a chair (including a wheelchair)?: None Help needed standing up from a chair using your arms (e.g., wheelchair or bedside chair)?: None Help needed to walk in hospital room?: A Little Help needed climbing 3-5 steps with a railing? : A Little 6 Click Score: 22    End of Session   Activity Tolerance: Patient tolerated treatment well Patient left: in chair;with call bell/phone within reach Nurse Communication: Mobility status;Other (comment) (tolerated mobility on RA) PT Visit Diagnosis: Unsteadiness on feet (R26.81);Other abnormalities of gait and mobility (R26.89);Difficulty in walking, not elsewhere classified (R26.2)     Time: 5465-0354 PT Time Calculation (min) (ACUTE ONLY): 20 min  Charges:  $Gait Training: 8-22 mins                    Stacie Glaze, PT DPT Acute Rehabilitation Services Pager [phone]  Office [phone]    [place] 08/28/2021, 2:49 PM

## 2021-08-28 NOTE — Progress Notes (Signed)
Progress Note  Patient Name: Vicki Wheeler Date of Encounter: 08/28/2021  [facility] Cardiologist: Sanda Klein, MD   Subjective   65 year old female with a history of chronic diastolic dysfunction.  She was admitted with acute on chronic diastolic heart failure.  Echocardiogram from August 27, 2021 reveals normal left ventricular systolic function.  The diastolic parameters were indeterminate.  The echocardiogram was technically difficult with poor image quality.  She has diuresed 7.2 L through this hospitalization. We will DC iv Lasix Start Torsemide 20 mg a day ( instead of the HCTZ she was on at home) Cont meds otherwise    Inpatient Medications    Scheduled Meds:  amiodarone  100 mg Oral Daily   apixaban  5 mg Oral BID   atenolol  100 mg Oral BID   atorvastatin  10 mg Oral Daily   clotrimazole  1 application Topical QODAY   furosemide  40 mg Intravenous BID   insulin aspart  0-9 Units Subcutaneous TID WC   levothyroxine  50 mcg Oral Daily   lisinopril  20 mg Oral Daily   potassium chloride  20 mEq Oral BID   sodium chloride flush  3 mL Intravenous Q12H   Continuous Infusions:  sodium chloride     PRN Meds: sodium chloride, acetaminophen, albuterol, diltiazem, sodium chloride flush   Vital Signs    Vitals:   08/27/21 2043 08/28/21 0400 08/28/21 0442 08/28/21 0801  BP: 136/73 130/69  (!) 124/59  Pulse: 82 87  93  Resp: 20 19  18   Temp: 98.1 F (36.7 C) 98.3 F (36.8 C)  98.3 F (36.8 C)  TempSrc: Oral Oral  Oral  SpO2: 93% 98%  92%  Weight:   (!) 155.1 kg   Height:        Intake/Output Summary (Last 24 hours) at 08/28/2021 0836 Last data filed at 08/28/2021 0500 Gross per 24 hour  Intake 404 ml  Output 3800 ml  Net -3396 ml    Last 3 Weights 08/28/2021 08/27/2021 08/26/2021  Weight (lbs) 341 lb 14.9 oz 346 lb 12.8 oz 353 lb  Weight (kg) 155.1 kg 157.307 kg 160.12 kg      Telemetry      0 - Personally Reviewed   ECG     -  Personally Reviewed  Physical Exam   GEN: No acute distress.  Morbidly obese , very pleasant  Neck: No JVD Cardiac: irregular, no murmurs, rubs, or gallops.  Respiratory: Clear to auscultation bilaterally. GI: Soft, nontender, non-distended  MS: trace leg edema; No deformity. Neuro:  Nonfocal  Psych: Normal affect   Labs    High Sensitivity Troponin:   Recent Labs  Lab 08/26/21 0414 08/26/21 0550  TROPONINIHS 10 12      Chemistry Recent Labs  Lab 08/26/21 0414 08/27/21 0401 08/28/21 0316  NA 136 141 141  K 3.7 3.4* 3.6  CL 97* 99 99  CO2 28 31 33*  GLUCOSE 159* 134* 132*  BUN 8 9 12   CREATININE 0.88 0.99 0.98  CALCIUM 9.2 8.7* 8.5*  PROT  --  7.2  --   ALBUMIN  --  2.9*  --   AST  --  16  --   ALT  --  17  --   ALKPHOS  --  66  --   BILITOT  --  1.4*  --   GFRNONAA >60 >60 >60  ANIONGAP 11 11 9      Lipids No results for input(s): CHOL, TRIG,  HDL, LABVLDL, LDLCALC, CHOLHDL in the last 168 hours.  Hematology Recent Labs  Lab 08/26/21 0414 08/27/21 0401  WBC 12.4* 10.0  RBC 4.09 4.01  HGB 12.0 11.9*  HCT 38.4 37.1  MCV 93.9 92.5  MCH 29.3 29.7  MCHC 31.3 32.1  RDW 14.5 14.2  PLT 233 209    Thyroid  Recent Labs  Lab 08/26/21 0834  TSH 1.517     BNP Recent Labs  Lab 08/26/21 0414  BNP 537.8*     DDimer No results for input(s): DDIMER in the last 168 hours.   Radiology    ECHOCARDIOGRAM COMPLETE  Result Date: 08/27/2021    ECHOCARDIOGRAM REPORT   Patient Name:   Vicki Wheeler Date of Exam: 08/27/2021 Medical Rec #:  [id number]         Height:       65.0 in Accession #:    [id number]        Weight:       346.8 lb Date of Birth:  18-Oct-1956         BSA:          2.499 m Patient Age:    12 years          BP:           112/64 mmHg Patient Gender: F                 HR:           104 bpm. Exam Location:  Inpatient Procedure: 2D Echo, Cardiac Doppler and Color Doppler Indications:    CHF-Acute Diastolic I14.43  History:        Patient has  prior history of Echocardiogram examinations, most                 recent 10/26/2017. Risk Factors:Morbid obesity.  Sonographer:    Merrie Roof RDCS Referring Phys: [id number] [place]  1. Left ventricular ejection fraction, by estimation, is 55 to 60%. The left ventricle has normal function. The left ventricle has no regional wall motion abnormalities. There is mild left ventricular hypertrophy. Left ventricular diastolic parameters are indeterminate.  2. Right ventricular systolic function is normal. The right ventricular size is normal. Tricuspid regurgitation signal is inadequate for assessing PA pressure.  3. Left atrial size was mildly dilated.  4. Right atrial size was mildly dilated.  5. The mitral valve is normal in structure. Trivial mitral valve regurgitation. No evidence of mitral stenosis.  6. The aortic valve is tricuspid. Aortic valve regurgitation is not visualized. Mild aortic valve sclerosis is present, with no evidence of aortic valve stenosis.  7. The inferior vena cava is normal in size with greater than 50% respiratory variability, suggesting right atrial pressure of 3 mmHg.  8. Technically difficult study with poor acoustic windows. FINDINGS  Left Ventricle: Left ventricular ejection fraction, by estimation, is 55 to 60%. The left ventricle has normal function. The left ventricle has no regional wall motion abnormalities. The left ventricular internal cavity size was normal in size. There is  mild left ventricular hypertrophy. Left ventricular diastolic parameters are indeterminate. Right Ventricle: The right ventricular size is normal. No increase in right ventricular wall thickness. Right ventricular systolic function is normal. Tricuspid regurgitation signal is inadequate for assessing PA pressure. Left Atrium: Left atrial size was mildly dilated. Right Atrium: Right atrial size was mildly dilated. Pericardium: There is no evidence of pericardial effusion. Mitral Valve: The  mitral valve is normal in structure.  Mild mitral annular calcification. Trivial mitral valve regurgitation. No evidence of mitral valve stenosis. Tricuspid Valve: The tricuspid valve is normal in structure. Tricuspid valve regurgitation is not demonstrated. Aortic Valve: The aortic valve is tricuspid. Aortic valve regurgitation is not visualized. Mild aortic valve sclerosis is present, with no evidence of aortic valve stenosis. Aortic valve mean gradient measures 4.0 mmHg. Aortic valve peak gradient measures 7.8 mmHg. Aortic valve area, by VTI measures 2.45 cm. Pulmonic Valve: The pulmonic valve was not well visualized. Pulmonic valve regurgitation is not visualized. Aorta: The aortic root is normal in size and structure. Venous: The inferior vena cava is normal in size with greater than 50% respiratory variability, suggesting right atrial pressure of 3 mmHg. IAS/Shunts: No atrial level shunt detected by color flow Doppler.  LEFT VENTRICLE PLAX 2D LVIDd:         4.90 cm LVIDs:         3.60 cm LV PW:         1.30 cm LV IVS:        1.10 cm LVOT diam:     1.80 cm LV SV:         50 LV SV Index:   20 LVOT Area:     2.54 cm  RIGHT VENTRICLE RV Basal diam:  4.00 cm LEFT ATRIUM             Index        RIGHT ATRIUM           Index LA diam:        5.20 cm 2.08 cm/m   RA Area:     21.30 cm LA Vol (A2C):   77.4 ml 30.97 ml/m  RA Volume:   59.20 ml  23.68 ml/m LA Vol (A4C):   91.3 ml 36.53 ml/m LA Biplane Vol: 88.3 ml 35.33 ml/m  AORTIC VALVE AV Area (Vmax):    2.22 cm AV Area (Vmean):   2.28 cm AV Area (VTI):     2.45 cm AV Vmax:           140.00 cm/s AV Vmean:          95.000 cm/s AV VTI:            0.205 m AV Peak Grad:      7.8 mmHg AV Mean Grad:      4.0 mmHg LVOT Vmax:         122.00 cm/s LVOT Vmean:        85.000 cm/s LVOT VTI:          0.197 m LVOT/AV VTI ratio: 0.96  AORTA Ao Root diam: 2.40 cm Ao Asc diam:  3.20 cm  SHUNTS Systemic VTI:  0.20 m Systemic Diam: 1.80 cm Dalton McleanMD Electronically signed by  Franki Monte Signature Date/Time: 08/27/2021/8:06:01 PM    Final     Cardiac Studies   Echo 10/26/2017 LV EF: 60% -   65%  Study Conclusions   - Left ventricle: The cavity size was normal. Systolic function was    normal. The estimated ejection fraction was in the range of 60%    to 65%. Wall motion was normal; there were no regional wall    motion abnormalities. The study is not technically sufficient to    allow evaluation of LV diastolic function.  - Aortic valve: Valve area (VTI): 2.55 cm^2. Valve area (Vmax):    2.48 cm^2. Valve area (Vmean): 2.63 cm^2.  - Mitral valve: There was mild regurgitation.  Valve area by    continuity equation (using LVOT flow): 2.3 cm^2.  - Left atrium: The atrium was moderately dilated.  - Atrial septum: A patent foramen ovale cannot be excluded.   Patient Profile     65 y.o. female with PMH of PAF, HTN, DM II, anxiety/depression, and morbid obesity presented on 08/26/2021 with increased dyspnea and was diagnosed of acute diastolic CHF due to dietary indiscretion.   Assessment & Plan    Acute diastolic heart failure  She is diuresed over 7 L.  I have stopped the IV Lasix.  We will start torsemide 20 mg a day.  Continue potassium chloride 20 mEq twice a day.  Anticipate that she will go home tomorrow.  I have encouraged her to get up and ambulate today.  It will also be good to make sure that the oral torsemide works for her.  She will need a basic metabolic profile at our office in several weeks.  She will return to see Dr. Sallyanne Kuster or an APP in 4 to 6 weeks.  Paroxysmal atrial flutter s/p ablation 01/2017: also has atypical atrial flutter and afib. Is basically asymptomatic.  Continue Eliquis  - Hypoxic respiratory failure  - per PT who ambulated the patient earlier, O2 at on room air at rest was 83%, during ambulation 79%. Currently on 2L oxygen, may need home O2 on discharge  HTN  DM II  Morbid obesity      Mertie Moores, MD   08/28/2021 8:51 AM    [facility],  [place], [place]  [address] Phone: [phone]; Fax: [phone]

## 2021-08-29 LAB — BASIC METABOLIC PANEL
Anion gap: 10 (ref 5–15)
BUN: 18 mg/dL (ref 8–23)
CO2: 29 mmol/L (ref 22–32)
Calcium: 8.6 mg/dL — ABNORMAL LOW (ref 8.9–10.3)
Chloride: 100 mmol/L (ref 98–111)
Creatinine, Ser: 1.03 mg/dL — ABNORMAL HIGH (ref 0.44–1.00)
GFR, Estimated: >60 mL/min (ref >60)
Glucose, Bld: 121 mg/dL — ABNORMAL HIGH (ref 70–99)
Potassium: 3.7 mmol/L (ref 3.5–5.1)
Sodium: 139 mmol/L (ref 135–145)

## 2021-08-29 LAB — GLUCOSE, CAPILLARY: Glucose-Capillary: 109 mg/dL — ABNORMAL HIGH (ref 70–99)

## 2021-08-29 MED ORDER — POTASSIUM CHLORIDE CRYS ER 20 MEQ PO TBCR: 20.0000 meq | EXTENDED_RELEASE_TABLET | Freq: Two times a day (BID) | ORAL | 3 refills | Status: DC

## 2021-08-29 MED ORDER — TORSEMIDE 20 MG PO TABS: 20.0000 mg | ORAL_TABLET | Freq: Every day | ORAL | 3 refills | Status: DC

## 2021-08-29 MED ORDER — LISINOPRIL 20 MG PO TABS: 20.0000 mg | ORAL_TABLET | Freq: Every day | ORAL | 3 refills | Status: DC

## 2021-08-29 NOTE — Progress Notes (Signed)
Brief cardiology f/u note  Appears that Dr. Acie Fredrickson left final recommendations yesterday and arranged for outpatient follow up.   Cardiology will formally sign off. Arranged for outpatient follow up in heart failure impact clinic on 09/01/21 at 11 AM.  Buford Dresser, MD, PhD, [facility] at [facility] [address], [place], [place] [address] [phone]

## 2021-08-29 NOTE — Progress Notes (Signed)
SATURATION QUALIFICATIONS: (This note is used to comply with regulatory documentation for home oxygen)  Patient Saturations on Room Air at Rest = 95%  Patient Saturations on Room Air while Ambulating =  92 %  Pt had walked in the hallway without any respiratory distress.

## 2021-08-29 NOTE — Plan of Care (Signed)
Pt walked without any oxygen requirement today

## 2021-08-29 NOTE — Discharge Summary (Signed)
Physician Discharge Summary   [name] MRN: [id number] DOB/AGE: 04-09-1956 65 y.o.  Admit date: 08/26/2021 Discharge date: 08/29/2021  Primary Care Physician:  Shanon Rosser, PA-C   Recommendations for Outpatient Follow-up:  Follow up with PCP in 1-2 weeks Please obtain BMP in one week  Patient started on torsemide 20 mg daily with potassium replacement  Home Health: Currently ambulating at baseline Equipment/Devices:   Discharge Condition: stable  CODE STATUS: FULL  Diet recommendation: Heart healthy diet   Discharge Diagnoses:    Acute respiratory failure with hypoxia ([place])   acute diastolic CHF   Probable obesity hypoventilation syndrome  Essential hypertension  Dyslipidemia  Paroxysmal atrial fibrillation (HCC)  Diabetes mellitus type 2 in obese (HCC)  Hypothyroidism  Morbid obesity  Hypokalemia  Prolonged QT interval   Consults:  Cardiology, Dr. Acie Fredrickson    Allergies:   Allergies  Allergen Reactions   Shellfish Allergy     blisters     DISCHARGE MEDICATIONS: Allergies as of 08/29/2021       Reactions   Shellfish Allergy    blisters        Medication List     STOP taking these medications    amLODipine 5 MG tablet Commonly known as: NORVASC   lisinopril-hydrochlorothiazide 20-25 MG tablet Commonly known as: ZESTORETIC       TAKE these medications    acetaminophen 500 MG tablet Commonly known as: TYLENOL Take 1,000 mg by mouth every 6 (six) hours as needed for mild pain or headache (pain.).   amiodarone 200 MG tablet Commonly known as: PACERONE Take 0.5 tablets (100 mg total) by mouth daily.   apixaban 5 MG Tabs tablet Commonly known as: Eliquis TAKE 1 TABLET(5 MG) BY MOUTH TWICE DAILY What changed:  how much to take how to take this when to take this additional instructions   atenolol 100 MG tablet Commonly known as: TENORMIN TAKE 1 TABLET(100 MG) BY MOUTH TWICE DAILY What changed: See the new  instructions.   atorvastatin 10 MG tablet Commonly known as: LIPITOR Take 1 tablet (10 mg total) by mouth daily.   cholecalciferol 25 MCG (1000 UNIT) tablet Commonly known as: VITAMIN D Take 2,000 Units by mouth 2 (two) times daily.   clotrimazole 1 % cream Commonly known as: LOTRIMIN Apply 1 application topically every other day.   diltiazem 30 MG tablet Commonly known as: CARDIZEM Take 1 tablet (30 mg total) by mouth every 4 (four) hours as needed (for afib heart rate over 100). Take 1 tablet by mouth every 4 hours AS NEEDED for AFIB heart rate over 100 What changed: additional instructions   levothyroxine 50 MCG tablet Commonly known as: SYNTHROID Take 50 mcg by mouth daily.   lisinopril 20 MG tablet Commonly known as: ZESTRIL Take 1 tablet (20 mg total) by mouth daily. Start taking on: August 30, 2021   metFORMIN 850 MG tablet Commonly known as: GLUCOPHAGE Take 1 tablet (850 mg total) by mouth daily with breakfast.   omeprazole 20 MG capsule Commonly known as: PRILOSEC Take 20 mg by mouth daily as needed (for acid reflux/indigestion.).   potassium chloride SA 20 MEQ tablet Commonly known as: KLOR-CON Take 1 tablet (20 mEq total) by mouth 2 (two) times daily.   torsemide 20 MG tablet Commonly known as: DEMADEX Take 1 tablet (20 mg total) by mouth daily. Start taking on: August 30, 2021   vitamin B-12 1000 MCG tablet Commonly known as: CYANOCOBALAMIN Take 1 tablet (1,000 mcg total)  by mouth daily.         Brief H and P: For complete details please refer to admission H and P, but in brief Patient is a 65 year old female with paroxysmal A. fib, hypertension, diabetes mellitus type 2, anxiety, depression, obesity presented with difficulty breathing.  Patient reported feeling unwell approximately 5 days ago prior to admission, initially attributed her symptoms to working too much.  Per patient, she had urinary frequency, diarrhea, symptoms of 'stomach virus'  that seemed to resolve within 24 hours.  Patient also reported that at her workplace, 4 colleagues had recently been COVID-positive.  She tested herself and was negative.  During this time, she started having progressively worsening shortness of breath.  At baseline, lives alone, sleeps sitting up for last several years due to reflux, does not require O2.  However she noticed, she had worsening shortness of breath on ambulating and had to rest to catch her breath.  No fevers or chills, did report productive cough with clear sputum, intermittent wheezing with activity and leg swelling. In ED, patient was found to be hypoxic with O2 sats 82% on room air, improved on 4 L. BNP 537.8, chest x-ray showed pulm edema.  Hospital Course:   Acute respiratory failure with hypoxia secondary to acute diastolic congestive heart failure (HCC) -Not on O2 at home, initially required 4 L of O2 on admission, has been weaned off.  -Home O2 evaluation done, does not need O2 with ambulation -Patient was placed on IV Lasix for diuresis, had a negative balance of 7.8 L, transitioned to torsemide 20 mg daily --Weight 353.3lbs on admission, improved to 341 at the time of discharge. -Norvasc discontinued, (patient reported significant ankle swelling after starting this medication outpatient) --2D echo showed EF of 55 to 60%, mild LVH, indeterminate diastolic parameters Also possibly has obesity hypoventilation syndrome, rule out obstructive sleep apnea.  Patient was recommended diet and weight control, low-sodium diet      Essential hypertension -BP stable, continue beta-blocker, lisinopril, torsemide     Dyslipidemia -Continue atorvastatin     Paroxysmal atrial fibrillation (HCC) -Heart rate currently controlled, continue beta-blocker, amiodarone - CHA2DS2-VASc 4, continue Eliquis     Diabetes mellitus type 2 in obese ([place]), NIDDM, with hyperlipidemia -hemoglobin A1c 6.4 -Resume metformin      Hypothyroidism -TSH  1.5, continue Synthroid     Prolonged QT interval -Avoid QT prolonging meds -Potassium replaced   Hypokalemia -Resolved     Morbid obesity with BMI of 50.0-59.9, adult (HCC) Estimated body mass index is 56.9 kg/m as calculated from the following:   Height as of this encounter: 5\' 5"  (1.651 m).   Weight as of this encounter: 155.1 kg. -Discussed about weight and dietary changes  Day of Discharge S: Ambulating in the room without any difficulty, wants to go home.  Off O2.  No chest pain or shortness of breath  BP (!) 127/59 (BP Location: Left Arm)   Pulse (!) 57   Temp 98.3 F (36.8 C) (Oral)   Resp 18   Ht 5\' 5"  (1.651 m)   Wt (!) 155 kg   LMP 04/09/2007   SpO2 94%   BMI 56.86 kg/m   Physical Exam: General: Alert and awake oriented x3 not in any acute distress. CVS: S1-S2 clear no murmur rubs or gallops Chest: clear to auscultation bilaterally, no wheezing rales or rhonchi Abdomen: soft nontender, nondistended, normal bowel sounds Extremities: no cyanosis, clubbing or edema noted bilaterally Neuro: no new deficits  Get Medicines reviewed and adjusted: Please take all your medications with you for your next visit with your Primary MD  Please request your Primary MD to go over all hospital tests and procedure/radiological results at the follow up. Please ask your Primary MD to get all Hospital records sent to his/her office.  If you experience worsening of your admission symptoms, develop shortness of breath, life threatening emergency, suicidal or homicidal thoughts you must seek medical attention immediately by calling 911 or calling your MD immediately  if symptoms less severe.  You must read complete instructions/literature along with all the possible adverse reactions/side effects for all the Medicines you take and that have been prescribed to you. Take any new Medicines after you have completely understood and accept all the possible adverse reactions/side  effects.   Do not drive when taking pain medications.   Do not take more than prescribed Pain, Sleep and Anxiety Medications  Special Instructions: If you have smoked or chewed Tobacco  in the last 2 yrs please stop smoking, stop any regular Alcohol  and or any Recreational drug use.  Wear Seat belts while driving.  Please note  You were cared for by a hospitalist during your hospital stay. Once you are discharged, your primary care physician will handle any further medical issues. Please note that NO REFILLS for any discharge medications will be authorized once you are discharged, as it is imperative that you return to your primary care physician (or establish a relationship with a primary care physician if you do not have one) for your aftercare needs so that they can reassess your need for medications and monitor your lab values.   The results of significant diagnostics from this hospitalization (including imaging, microbiology, ancillary and laboratory) are listed below for reference.      Procedures/Studies:  DG Chest 2 View  Result Date: 08/26/2021 CLINICAL DATA:  Reason for exam: sob BIB [facility] for SOB x 4 days. Worse with exertion. Lung sounds clear and equal bilat, per pt stomach bug recently. PIV 18ga Lt AC. [place] 4lpm RA sat 86%. EXAM: CHEST - 2 VIEW COMPARISON:  10/24/2017 FINDINGS: New extensive somewhat granular airspace opacities throughout the right lung and on the left predominantly at the lung base. Heart size upper limits normal as before. Implanted event monitor overlies the left lower chest. No effusion.  No pneumothorax. Visualized bones unremarkable. IMPRESSION: Extensive asymmetric infiltrates or edema, right worse than left. Electronically Signed   By: Lucrezia Europe M.D.   On: 08/26/2021 05:17   ECHOCARDIOGRAM COMPLETE  Result Date: 08/27/2021    ECHOCARDIOGRAM REPORT   Patient Name:   Vicki Wheeler Date of Exam: 08/27/2021 Medical Rec #:  [id number]         Height:        65.0 in Accession #:    [id number]        Weight:       346.8 lb Date of Birth:  April 12, 1956         BSA:          2.499 m Patient Age:    49 years          BP:           112/64 mmHg Patient Gender: F                 HR:           104 bpm. Exam Location:  Inpatient Procedure: 2D Echo, Cardiac Doppler and Color  Doppler Indications:    CHF-Acute Diastolic Q30.09  History:        Patient has prior history of Echocardiogram examinations, most                 recent 10/26/2017. Risk Factors:Morbid obesity.  Sonographer:    Merrie Roof RDCS Referring Phys: [id number] [place]  1. Left ventricular ejection fraction, by estimation, is 55 to 60%. The left ventricle has normal function. The left ventricle has no regional wall motion abnormalities. There is mild left ventricular hypertrophy. Left ventricular diastolic parameters are indeterminate.  2. Right ventricular systolic function is normal. The right ventricular size is normal. Tricuspid regurgitation signal is inadequate for assessing PA pressure.  3. Left atrial size was mildly dilated.  4. Right atrial size was mildly dilated.  5. The mitral valve is normal in structure. Trivial mitral valve regurgitation. No evidence of mitral stenosis.  6. The aortic valve is tricuspid. Aortic valve regurgitation is not visualized. Mild aortic valve sclerosis is present, with no evidence of aortic valve stenosis.  7. The inferior vena cava is normal in size with greater than 50% respiratory variability, suggesting right atrial pressure of 3 mmHg.  8. Technically difficult study with poor acoustic windows. FINDINGS  Left Ventricle: Left ventricular ejection fraction, by estimation, is 55 to 60%. The left ventricle has normal function. The left ventricle has no regional wall motion abnormalities. The left ventricular internal cavity size was normal in size. There is  mild left ventricular hypertrophy. Left ventricular diastolic parameters are indeterminate. Right  Ventricle: The right ventricular size is normal. No increase in right ventricular wall thickness. Right ventricular systolic function is normal. Tricuspid regurgitation signal is inadequate for assessing PA pressure. Left Atrium: Left atrial size was mildly dilated. Right Atrium: Right atrial size was mildly dilated. Pericardium: There is no evidence of pericardial effusion. Mitral Valve: The mitral valve is normal in structure. Mild mitral annular calcification. Trivial mitral valve regurgitation. No evidence of mitral valve stenosis. Tricuspid Valve: The tricuspid valve is normal in structure. Tricuspid valve regurgitation is not demonstrated. Aortic Valve: The aortic valve is tricuspid. Aortic valve regurgitation is not visualized. Mild aortic valve sclerosis is present, with no evidence of aortic valve stenosis. Aortic valve mean gradient measures 4.0 mmHg. Aortic valve peak gradient measures 7.8 mmHg. Aortic valve area, by VTI measures 2.45 cm. Pulmonic Valve: The pulmonic valve was not well visualized. Pulmonic valve regurgitation is not visualized. Aorta: The aortic root is normal in size and structure. Venous: The inferior vena cava is normal in size with greater than 50% respiratory variability, suggesting right atrial pressure of 3 mmHg. IAS/Shunts: No atrial level shunt detected by color flow Doppler.  LEFT VENTRICLE PLAX 2D LVIDd:         4.90 cm LVIDs:         3.60 cm LV PW:         1.30 cm LV IVS:        1.10 cm LVOT diam:     1.80 cm LV SV:         50 LV SV Index:   20 LVOT Area:     2.54 cm  RIGHT VENTRICLE RV Basal diam:  4.00 cm LEFT ATRIUM             Index        RIGHT ATRIUM           Index LA diam:  5.20 cm 2.08 cm/m   RA Area:     21.30 cm LA Vol (A2C):   77.4 ml 30.97 ml/m  RA Volume:   59.20 ml  23.68 ml/m LA Vol (A4C):   91.3 ml 36.53 ml/m LA Biplane Vol: 88.3 ml 35.33 ml/m  AORTIC VALVE AV Area (Vmax):    2.22 cm AV Area (Vmean):   2.28 cm AV Area (VTI):     2.45 cm AV  Vmax:           140.00 cm/s AV Vmean:          95.000 cm/s AV VTI:            0.205 m AV Peak Grad:      7.8 mmHg AV Mean Grad:      4.0 mmHg LVOT Vmax:         122.00 cm/s LVOT Vmean:        85.000 cm/s LVOT VTI:          0.197 m LVOT/AV VTI ratio: 0.96  AORTA Ao Root diam: 2.40 cm Ao Asc diam:  3.20 cm  SHUNTS Systemic VTI:  0.20 m Systemic Diam: 1.80 cm Dalton McleanMD Electronically signed by Franki Monte Signature Date/Time: 08/27/2021/8:06:01 PM    Final       LAB RESULTS: Basic Metabolic Panel: Recent Labs  Lab 08/28/21 0316 08/29/21 0255  NA 141 139  K 3.6 3.7  CL 99 100  CO2 33* 29  GLUCOSE 132* 121*  BUN 12 18  CREATININE 0.98 1.03*  CALCIUM 8.5* 8.6*   Liver Function Tests: Recent Labs  Lab 08/27/21 0401  AST 16  ALT 17  ALKPHOS 66  BILITOT 1.4*  PROT 7.2  ALBUMIN 2.9*   No results for input(s): LIPASE, AMYLASE in the last 168 hours. No results for input(s): AMMONIA in the last 168 hours. CBC: Recent Labs  Lab 08/26/21 0414 08/27/21 0401  WBC 12.4* 10.0  NEUTROABS 10.2* 7.4  HGB 12.0 11.9*  HCT 38.4 37.1  MCV 93.9 92.5  PLT 233 209   Cardiac Enzymes: No results for input(s): CKTOTAL, CKMB, CKMBINDEX, TROPONINI in the last 168 hours. BNP: Invalid input(s): POCBNP CBG: Recent Labs  Lab 08/28/21 2028 08/29/21 0438  GLUCAP 138* 109*       Disposition and Follow-up: Discharge Instructions     (HEART FAILURE PATIENTS) Call MD:  Anytime you have any of the following symptoms: 1) 3 pound weight gain in 24 hours or 5 pounds in 1 week 2) shortness of breath, with or without a dry hacking cough 3) swelling in the hands, feet or stomach 4) if you have to sleep on extra pillows at night in order to breathe.   Complete by: As directed    Diet - low sodium heart healthy   Complete by: As directed    Increase activity slowly   Complete by: As directed         DISPOSITION: [facility] Follow up on 09/01/2021.   Specialty: Cardiology Why: at 1100. [facility]. Entrance C. Contact information: [address] [id number] [place] Kimberly, PA-C Follow up.   Specialty: Physician Assistant Contact information: [address] [place] [address] [phone]         Sanda Klein, MD .  Specialty: Cardiology Contact information: [address] [place] [address] [phone]         Sanda Klein, MD .   Specialty: Cardiology Contact information: [address] [place] [address] [phone]                  Time coordinating discharge:  35 minutes  Signed:   Estill Cotta M.D. [facility] 08/29/2021, 11:20 AM

## 2021-09-01 ENCOUNTER — Ambulatory Visit (HOSPITAL_COMMUNITY)
Admission: RE | Admit: 2021-09-01 | Discharge: 2021-09-01 | Disposition: A | Discharge status: Home / Self Care | Payer: BC Managed Care – PPO | Source: Ambulatory Visit | Attending: Family Medicine | Admitting: Family Medicine

## 2021-09-01 ENCOUNTER — Telehealth (HOSPITAL_COMMUNITY): Payer: Self-pay

## 2021-09-01 ENCOUNTER — Other Ambulatory Visit: Payer: Self-pay

## 2021-09-01 ENCOUNTER — Encounter (HOSPITAL_COMMUNITY): Payer: Self-pay

## 2021-09-01 VITALS — BP 110/58 | HR 52 | Wt 342.0 lb

## 2021-09-01 DIAGNOSIS — Z833 Family history of diabetes mellitus: Secondary | ICD-10-CM | POA: Diagnosis not present

## 2021-09-01 DIAGNOSIS — E039 Hypothyroidism, unspecified: Secondary | ICD-10-CM | POA: Diagnosis not present

## 2021-09-01 DIAGNOSIS — Z79890 Hormone replacement therapy (postmenopausal): Secondary | ICD-10-CM | POA: Diagnosis not present

## 2021-09-01 DIAGNOSIS — Z7901 Long term (current) use of anticoagulants: Secondary | ICD-10-CM | POA: Diagnosis not present

## 2021-09-01 DIAGNOSIS — Z8249 Family history of ischemic heart disease and other diseases of the circulatory system: Secondary | ICD-10-CM | POA: Insufficient documentation

## 2021-09-01 DIAGNOSIS — I110 Hypertensive heart disease with heart failure: Secondary | ICD-10-CM | POA: Diagnosis not present

## 2021-09-01 DIAGNOSIS — Z7984 Long term (current) use of oral hypoglycemic drugs: Secondary | ICD-10-CM | POA: Diagnosis not present

## 2021-09-01 DIAGNOSIS — I4892 Unspecified atrial flutter: Secondary | ICD-10-CM | POA: Diagnosis not present

## 2021-09-01 DIAGNOSIS — I10 Essential (primary) hypertension: Secondary | ICD-10-CM | POA: Diagnosis not present

## 2021-09-01 DIAGNOSIS — I5032 Chronic diastolic (congestive) heart failure: Secondary | ICD-10-CM | POA: Diagnosis not present

## 2021-09-01 DIAGNOSIS — E119 Type 2 diabetes mellitus without complications: Secondary | ICD-10-CM | POA: Insufficient documentation

## 2021-09-01 DIAGNOSIS — Z79899 Other long term (current) drug therapy: Secondary | ICD-10-CM | POA: Diagnosis not present

## 2021-09-01 LAB — BASIC METABOLIC PANEL
Anion gap: 10 (ref 5–15)
BUN: 14 mg/dL (ref 8–23)
CO2: 29 mmol/L (ref 22–32)
Calcium: 8.7 mg/dL — ABNORMAL LOW (ref 8.9–10.3)
Chloride: 99 mmol/L (ref 98–111)
Creatinine, Ser: 1.02 mg/dL — ABNORMAL HIGH (ref 0.44–1.00)
GFR, Estimated: >60 mL/min (ref >60)
Glucose, Bld: 71 mg/dL (ref 70–99)
Potassium: 4.2 mmol/L (ref 3.5–5.1)
Sodium: 138 mmol/L (ref 135–145)

## 2021-09-01 NOTE — Patient Instructions (Addendum)
Your labs work was drawn today. You will be called with any abnormal results.   No medications changes were made at your appointment today, continue to take medications as prescribed.   Do the following things EVERYDAY: Weigh yourself in the morning before breakfast. Write it down and keep it in a log. Take your medicines as prescribed Eat low salt foods--Limit salt (sodium) to 2000 mg per day.  Stay as active as you can everyday Limit all fluids for the day to less than 2 liters   If you have any questions, issues, or concerns before your next appointment please call our office at [phone], opt. 2 and leave a message for the triage nurse.  Thank you for allowing [place] to provide your heart failure care after your recent hospitalization. Please follow-up with Dr. Sallyanne Kuster or APP in 4-6 weeks. Call his office to schedule your follow up appt.
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
## 2021-09-01 NOTE — Progress Notes (Signed)
Heart and Vascular Care Navigation  09/01/2021  Tashera Guerette 25-Jun-1956 [id number]  Reason for Referral: Patient seen in HF TOC.   Engaged with patient face to face for initial visit for Heart and Vascular Care Coordination.                                                                                                   Assessment:   Patient is a 65 yo female who lives alone. She presents today to the [facility] clinic with her sister. Patient  states she is still currently employed and has a good Buyer, retail. Patient asked multiple questions regarding past medical bills and thought she was making monthly payments to [facility]. Patient denies any other SDoH needs at this time.                                HRT/VAS Care Coordination     Patients Home Cardiology Office --  [facility]   Outpatient Care Team Social Worker   Social Worker Name: Raquel Sarna, Marlinda Mike [phone]   Living arrangements for the past 2 months Apartment   Lives with: Self   Patient Current Insurance Coverage Commercial Insurance   Patient Has Concern With Paying Medical Bills No   Does Patient Have Prescription Coverage? Yes   Home Assistive Devices/Equipment Cane (specify quad or straight); Eyeglasses       Social History:                                                                             SDOH Screenings   Alcohol Screen: Not on file  Depression (PHQ2-9): Not on file  Financial Resource Strain: Low Risk    Difficulty of Paying Living Expenses: Not very hard  Food Insecurity: No Food Insecurity   Worried About Charity fundraiser in the Last Year: Never true   Ran Out of Food in the Last Year: Never true  Housing: Low Risk    Last Housing Risk Score: 0  Physical Activity: Not on file  Social Connections: Not on file  Stress: Not on file  Tobacco Use: Low Risk    Smoking Tobacco Use: Never   Smokeless Tobacco Use: Never   Passive Exposure: Not on file  Transportation Needs: No Transportation  Needs   Lack of Transportation (Medical): No   Lack of Transportation (Non-Medical): No    SDOH Interventions: Financial Resources:  Sales promotion account executive Interventions: Intervention Not Indicated N/a  Food Insecurity:  Food Insecurity Interventions: Intervention Not Indicated  Housing Insecurity:  Housing Interventions: Intervention Not Indicated  Transportation:   Transportation Interventions: Intervention Not Indicated   Follow-up plan:  Patient provided information for financial counseling to contact regarding past due bills and payment arrangements. Patient has  very supportive sister who will assist as needed with follow up. CSW available as needed. Raquel Sarna, [place], [place]

## 2021-09-01 NOTE — Telephone Encounter (Signed)
Call attempted to confirm HV TOC appt today at [place] appropriate VM left with callback number.   Pricilla Holm, MSN, RN Heart Failure Nurse Navigator [phone]

## 2021-09-11 ENCOUNTER — Encounter: Payer: Self-pay | Admitting: Nurse Practitioner

## 2021-09-11 ENCOUNTER — Telehealth: Payer: Self-pay

## 2021-09-11 ENCOUNTER — Ambulatory Visit: Payer: BC Managed Care – PPO | Admitting: Nurse Practitioner

## 2021-09-11 VITALS — BP 110/70 | HR 49 | Ht 65.0 in | Wt 339.0 lb

## 2021-09-11 DIAGNOSIS — Z7901 Long term (current) use of anticoagulants: Secondary | ICD-10-CM | POA: Diagnosis not present

## 2021-09-11 DIAGNOSIS — Z1211 Encounter for screening for malignant neoplasm of colon: Secondary | ICD-10-CM | POA: Diagnosis not present

## 2021-09-11 NOTE — Telephone Encounter (Signed)
Request for surgical clearance:     Endoscopy Procedure  What type of surgery is being performed?     Colonoscopy  When is this surgery scheduled?     11/16/21  What type of clearance is required ?   Pharmacy  Are there any medications that need to be held prior to surgery and how long? 2 day hold Eliquis  Practice name and name of physician performing surgery?      [facility]  What is your office phone and fax number?      Phone- [phone]  [phone]  Anesthesia type (None, local, MAC, general) ?       MAC

## 2021-09-11 NOTE — Patient Instructions (Signed)
If you are age 63 or younger, your body mass index should be between 19-25. Your Body mass index is 56.41 kg/m. If this is out of the aformentioned range listed, please consider follow up with your Primary Care Provider.   The [facility] providers would like to encourage you to use [facility] to communicate with providers for non-urgent requests or questions.  Due to long hold times on the telephone, sending your provider a message by [facility] may be faster and more efficient way to get a response. Please allow 48 business hours for a response.  Please remember that this is for non-urgent requests/questions.  PROCEDURES: You have been scheduled for a colonoscopy. Please follow the written instructions given to you at your visit today. If you use inhalers (even only as needed), please bring them with you on the day of your procedure.  It was great seeing you today! Thank you for entrusting me with your care and choosing [facility].  Tye Savoy, NP
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
## 2021-09-14 NOTE — Telephone Encounter (Signed)
Patient with diagnosis of afib/flutter on Eliquis for anticoagulation.    Procedure: colonoscopy Date of procedure: 11/16/21  CHA2DS2-VASc Score = 4  This indicates a 4.8% annual risk of stroke. The patient's score is based upon: CHF History: 1 HTN History: 1 Diabetes History: 1 Stroke History: 0 Vascular Disease History: 0 Age Score: 0 Gender Score: 1   CrCl 85mL/min using adjusted body weight Platelet count 209K  Per office protocol, patient can hold Eliquis for 2 days prior to procedure.

## 2021-09-15 NOTE — Telephone Encounter (Signed)
    Patient Name: Vicki Wheeler  DOB: [birth year] MRN: [id number]  Primary Cardiologist: Sanda Klein, MD  Chart reviewed as part of pre-operative protocol coverage. Given past medical history and time since last visit, based on ACC/AHA guidelines, Eline Ringel would be at acceptable risk for the planned procedure without further cardiovascular testing.   Patient has been cleared to hold Eliquis for 2 days prior to the procedure and restart as soon as possible afterward at the surgeon's discretion.  I will route this recommendation to the requesting party via Epic fax function and remove from pre-op pool.  Please call with questions. Please inform the patient how long to hold [place], [place] 09/15/2021, 1:23 PM

## 2021-09-15 NOTE — Telephone Encounter (Signed)
Will route to requesting surgeon's office and they will contact pt with medication instructions.

## 2021-10-27 ENCOUNTER — Encounter: Payer: Self-pay | Admitting: Cardiovascular Disease

## 2021-10-27 ENCOUNTER — Ambulatory Visit (INDEPENDENT_AMBULATORY_CARE_PROVIDER_SITE_OTHER): Payer: BC Managed Care – PPO | Admitting: Cardiovascular Disease

## 2021-10-27 ENCOUNTER — Other Ambulatory Visit: Payer: Self-pay

## 2021-10-27 VITALS — BP 132/72 | HR 48 | Ht 65.0 in | Wt 347.0 lb

## 2021-10-27 DIAGNOSIS — Z79899 Other long term (current) drug therapy: Secondary | ICD-10-CM

## 2021-10-27 DIAGNOSIS — Z5181 Encounter for therapeutic drug level monitoring: Secondary | ICD-10-CM

## 2021-10-27 DIAGNOSIS — Z6841 Body Mass Index (BMI) 40.0 and over, adult: Secondary | ICD-10-CM

## 2021-10-27 DIAGNOSIS — Z7901 Long term (current) use of anticoagulants: Secondary | ICD-10-CM | POA: Diagnosis not present

## 2021-10-27 DIAGNOSIS — I5032 Chronic diastolic (congestive) heart failure: Secondary | ICD-10-CM

## 2021-10-27 DIAGNOSIS — Z4509 Encounter for adjustment and management of other cardiac device: Secondary | ICD-10-CM

## 2021-10-27 DIAGNOSIS — E1169 Type 2 diabetes mellitus with other specified complication: Secondary | ICD-10-CM

## 2021-10-27 DIAGNOSIS — I10 Essential (primary) hypertension: Secondary | ICD-10-CM

## 2021-10-27 DIAGNOSIS — I480 Paroxysmal atrial fibrillation: Secondary | ICD-10-CM | POA: Diagnosis not present

## 2021-10-27 DIAGNOSIS — E669 Obesity, unspecified: Secondary | ICD-10-CM

## 2021-10-27 MED ORDER — ATENOLOL 100 MG PO TABS: 100.0000 mg | ORAL_TABLET | Freq: Every day | ORAL | 3 refills | Status: DC
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
## 2021-10-27 NOTE — Patient Instructions (Signed)
Medication Instructions:  DECREASE the Atenolol to 100 mg once daily  *If you need a refill on your cardiac medications before your next appointment, please call your pharmacy*   Lab Work: None ordered If you have labs (blood work) drawn today and your tests are completely normal, you will receive your results only by: [place] (if you have MyChart) OR A paper copy in the mail If you have any lab test that is abnormal or we need to change your treatment, we will call you to review the results.   Testing/Procedures: None ordered   Follow-Up: At [facility], you and your health needs are our priority.  As part of our continuing mission to provide you with exceptional heart care, we have created designated Provider Care Teams.  These Care Teams include your primary Cardiologist (physician) and Advanced Practice Providers (APPs -  Physician Assistants and Nurse Practitioners) who all work together to provide you with the care you need, when you need it.  We recommend signing up for the patient portal called "MyChart".  Sign up information is provided on this After Visit Summary.  MyChart is used to connect with patients for Virtual Visits (Telemedicine).  Patients are able to view lab/test results, encounter notes, upcoming appointments, etc.  Non-urgent messages can be sent to your provider as well.   To learn more about what you can do with MyChart, go to [email or web address].    Your next appointment:   12 month(s)  The format for your next appointment:   In Person  Provider:   Sanda Klein, MD     Other Instructions Dr. Sallyanne Kuster would like you to check your blood pressure daily for the next 3 weeks.  Keep a journal of these daily blood pressure and heart rate readings and call our office or send a message through [place] with the results. Thank you!  It is best to check your BP 1-2 hours after taking your medications to see the medications effectiveness on your BP.     Here are some tips that our clinical pharmacists share for home BP monitoring:          Rest 10 minutes before taking your blood pressure.          Don't smoke or drink caffeinated beverages for at least 30 minutes before.          Take your blood pressure before (not after) you eat.          Sit comfortably with your back supported and both feet on the floor (don't cross your legs).          Elevate your arm to heart level on a table or a desk.          Use the proper sized cuff. It should fit smoothly and snugly around your bare upper arm. There should be enough room to slip a fingertip under the cuff. The bottom edge of the cuff should be 1 inch above the crease of the elbow.

## 2021-11-03 ENCOUNTER — Telehealth: Payer: Self-pay

## 2021-11-03 ENCOUNTER — Other Ambulatory Visit: Payer: Self-pay

## 2021-11-03 MED ORDER — TORSEMIDE 20 MG PO TABS
20.0000 mg | ORAL_TABLET | Freq: Every day | ORAL | 3 refills | Status: DC
Start: 2021-11-03 — End: 2022-12-13

## 2021-11-03 NOTE — Telephone Encounter (Signed)
-----   Message from Scarlette Calico, RN sent at 11/03/2021 12:42 PM EST ----- Pt left VM on our line requesting refill for Torsemide be sent to [facility] at [address]. She states she takes extra as/when needed so needs more than 30 tabs Thanks

## 2021-11-04 ENCOUNTER — Encounter (HOSPITAL_COMMUNITY): Payer: Self-pay | Admitting: Internal Medicine

## 2021-11-04 NOTE — Progress Notes (Signed)
Attempted to obtain medical history via telephone, unable to reach at this time. I left a voicemail to return pre surgical testing department's phone call.  

## 2021-11-08 DIAGNOSIS — Z860101 Personal history of adenomatous and serrated colon polyps: Secondary | ICD-10-CM

## 2021-11-08 DIAGNOSIS — Z86010 Personal history of colonic polyps: Secondary | ICD-10-CM

## 2021-11-08 HISTORY — DX: Personal history of adenomatous and serrated colon polyps: Z86.0101

## 2021-11-08 HISTORY — DX: Personal history of colonic polyps: Z86.010

## 2021-11-11 ENCOUNTER — Encounter: Payer: Self-pay | Admitting: Cardiovascular Disease

## 2021-11-11 DIAGNOSIS — R609 Edema, unspecified: Secondary | ICD-10-CM

## 2021-11-11 NOTE — Telephone Encounter (Signed)
Patient notified to hold her Eliquis 2 days before procedure and restart after. Patient expressed understanding and agreement.

## 2021-11-11 NOTE — Telephone Encounter (Signed)
LM for patient to return call.

## 2021-11-11 NOTE — Telephone Encounter (Signed)
Patient is returning your call.  

## 2021-11-13 ENCOUNTER — Telehealth (HOSPITAL_COMMUNITY): Payer: Self-pay | Admitting: *Deleted

## 2021-11-13 NOTE — Telephone Encounter (Signed)
Please see mychart messages regarding the response from [place], covering for Dr.C- patient will increase medication and come for blood work next week. Thank you!

## 2021-11-13 NOTE — Telephone Encounter (Signed)
Does she take 20 mg daily and then an extra dose if weight is above 345 pounds?  If so, appears weight is staying consistently above 345, would recommend taking torsemide 40 mg daily, check BMET, magnesium next week.

## 2021-11-13 NOTE — Telephone Encounter (Signed)
Patient left vm c/o weight gain. Pt said she is talking all medications as directed. Pt is not followed by the heart failure clinic she was seen in [facility] clinic and referred back to [facility] heart care. Pt is follow by Dr.Croitoru.   Routed to [facility] triage pool

## 2021-11-16 ENCOUNTER — Ambulatory Visit (HOSPITAL_COMMUNITY): Payer: BC Managed Care – PPO | Admitting: Certified Registered Nurse Anesthetist

## 2021-11-16 ENCOUNTER — Encounter (HOSPITAL_COMMUNITY): Payer: Self-pay | Admitting: Internal Medicine

## 2021-11-16 ENCOUNTER — Encounter (HOSPITAL_COMMUNITY)
Admission: RE | Disposition: A | Discharge status: Home / Self Care | Payer: Self-pay | Source: Home / Self Care | Attending: Internal Medicine

## 2021-11-16 ENCOUNTER — Other Ambulatory Visit: Payer: Self-pay

## 2021-11-16 ENCOUNTER — Ambulatory Visit (HOSPITAL_COMMUNITY)
Admission: RE | Admit: 2021-11-16 | Discharge: 2021-11-16 | Disposition: A | Discharge status: Home / Self Care | Payer: BC Managed Care – PPO | Attending: Internal Medicine | Admitting: Internal Medicine

## 2021-11-16 DIAGNOSIS — I110 Hypertensive heart disease with heart failure: Secondary | ICD-10-CM | POA: Diagnosis not present

## 2021-11-16 DIAGNOSIS — E039 Hypothyroidism, unspecified: Secondary | ICD-10-CM | POA: Insufficient documentation

## 2021-11-16 DIAGNOSIS — E119 Type 2 diabetes mellitus without complications: Secondary | ICD-10-CM | POA: Diagnosis not present

## 2021-11-16 DIAGNOSIS — I509 Heart failure, unspecified: Secondary | ICD-10-CM | POA: Diagnosis not present

## 2021-11-16 DIAGNOSIS — F418 Other specified anxiety disorders: Secondary | ICD-10-CM | POA: Insufficient documentation

## 2021-11-16 DIAGNOSIS — Z79899 Other long term (current) drug therapy: Secondary | ICD-10-CM | POA: Diagnosis not present

## 2021-11-16 DIAGNOSIS — I498 Other specified cardiac arrhythmias: Secondary | ICD-10-CM | POA: Insufficient documentation

## 2021-11-16 DIAGNOSIS — R519 Headache, unspecified: Secondary | ICD-10-CM | POA: Insufficient documentation

## 2021-11-16 DIAGNOSIS — Z7984 Long term (current) use of oral hypoglycemic drugs: Secondary | ICD-10-CM | POA: Insufficient documentation

## 2021-11-16 DIAGNOSIS — D124 Benign neoplasm of descending colon: Secondary | ICD-10-CM | POA: Diagnosis not present

## 2021-11-16 DIAGNOSIS — R0602 Shortness of breath: Secondary | ICD-10-CM | POA: Insufficient documentation

## 2021-11-16 DIAGNOSIS — Z7901 Long term (current) use of anticoagulants: Secondary | ICD-10-CM | POA: Insufficient documentation

## 2021-11-16 DIAGNOSIS — I4891 Unspecified atrial fibrillation: Secondary | ICD-10-CM | POA: Insufficient documentation

## 2021-11-16 DIAGNOSIS — Z6841 Body Mass Index (BMI) 40.0 and over, adult: Secondary | ICD-10-CM | POA: Insufficient documentation

## 2021-11-16 DIAGNOSIS — Z1211 Encounter for screening for malignant neoplasm of colon: Secondary | ICD-10-CM | POA: Diagnosis present

## 2021-11-16 DIAGNOSIS — E785 Hyperlipidemia, unspecified: Secondary | ICD-10-CM | POA: Diagnosis not present

## 2021-11-16 HISTORY — PX: POLYPECTOMY: SHX5525

## 2021-11-16 HISTORY — PX: COLONOSCOPY WITH PROPOFOL: SHX5780

## 2021-11-16 LAB — GLUCOSE, CAPILLARY: Glucose-Capillary: 81 mg/dL (ref 70–99)

## 2021-11-16 SURGERY — COLONOSCOPY WITH PROPOFOL
Anesthesia: Monitor Anesthesia Care

## 2021-11-16 MED ORDER — SODIUM CHLORIDE 0.9 % IV SOLN: INTRAVENOUS | Status: DC

## 2021-11-16 MED ORDER — PROPOFOL 500 MG/50ML IV EMUL
INTRAVENOUS | Status: DC | PRN
Start: 2021-11-16 — End: 2021-11-16
  Administered 2021-11-16: 100 ug/kg/min via INTRAVENOUS

## 2021-11-16 MED ORDER — PROPOFOL 10 MG/ML IV BOLUS
INTRAVENOUS | Status: AC
  Filled 2021-11-16: qty 20

## 2021-11-16 MED ORDER — PROPOFOL 10 MG/ML IV BOLUS
INTRAVENOUS | Status: DC | PRN
Start: 2021-11-16 — End: 2021-11-16
  Administered 2021-11-16: 10 mg via INTRAVENOUS

## 2021-11-16 MED ORDER — LACTATED RINGERS IV SOLN
INTRAVENOUS | Status: AC | PRN
  Administered 2021-11-16: 10 mL/h via INTRAVENOUS

## 2021-11-16 MED ORDER — PROPOFOL 1000 MG/100ML IV EMUL
INTRAVENOUS | Status: AC
  Filled 2021-11-16: qty 100

## 2021-11-16 SURGICAL SUPPLY — 22 items

## 2021-11-16 NOTE — Transfer of Care (Signed)
Immediate Anesthesia Transfer of Care Note  Patient: Vicki Wheeler  Procedure(s) Performed: COLONOSCOPY WITH PROPOFOL POLYPECTOMY  Patient Location: PACU and Endoscopy Unit  Anesthesia Type:MAC  Level of Consciousness: awake, alert  and patient cooperative  Airway & Oxygen Therapy: Patient Spontanous Breathing and Patient connected to face mask oxygen  Post-op Assessment: Report given to RN and Post -op Vital signs reviewed and stable  Post vital signs: Reviewed and stable  Last Vitals:  Vitals Value Taken Time  BP    Temp    Pulse 57 11/16/21 0910  Resp 8 11/16/21 0910  SpO2 100 % 11/16/21 0910  Vitals shown include unvalidated device data.  Last Pain:  Vitals:   11/16/21 0804  TempSrc: Temporal  PainSc: 0-No pain         Complications: No notable events documented.
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
## 2021-11-16 NOTE — Anesthesia Preprocedure Evaluation (Signed)
Anesthesia Evaluation  Patient identified by MRN, date of birth, ID band Patient awake    Reviewed: Allergy & Precautions, NPO status , Patient's Chart, lab work & pertinent test results  Airway Mallampati: III  TM Distance: >3 FB Neck ROM: Full    Dental  (+) Teeth Intact, Dental Advisory Given   Pulmonary shortness of breath and with exertion,    breath sounds clear to auscultation + decreased breath sounds      Cardiovascular hypertension, Pt. on medications and Pt. on home beta blockers +CHF  Normal cardiovascular exam+ dysrhythmias Atrial Fibrillation  Rhythm:Regular Rate:Bradycardia  Hx/o attempted Atrial Fibrillation ablation, currently in chronic atrial fibrillation, on pacerone, atenolol and flecainide  EKG 10/27/21 Sinus Bradycardia   Neuro/Psych  Headaches, PSYCHIATRIC DISORDERS Anxiety Depression    GI/Hepatic Neg liver ROS, Colon Ca Screening   Endo/Other  diabetes, Well Controlled, Type 2, Oral Hypoglycemic AgentsHypothyroidism Morbid obesityHyperlipidemia  Renal/GU negative Renal ROS  negative genitourinary   Musculoskeletal negative musculoskeletal ROS (+)   Abdominal (+) + obese,   Peds  Hematology negative hematology ROS (+)   Anesthesia Other Findings   Reproductive/Obstetrics                             Anesthesia Physical Anesthesia Plan  ASA: 3  Anesthesia Plan: MAC   Post-op Pain Management:    Induction: Intravenous  PONV Risk Score and Plan: 2 and Treatment may vary due to age or medical condition and Propofol infusion  Airway Management Planned: Natural Airway and Simple Face Mask  Additional Equipment:   Intra-op Plan:   Post-operative Plan:   Informed Consent: I have reviewed the patients History and Physical, chart, labs and discussed the procedure including the risks, benefits and alternatives for the proposed anesthesia with the patient or  authorized representative who has indicated his/her understanding and acceptance.     Dental advisory given  Plan Discussed with: CRNA and Anesthesiologist  Anesthesia Plan Comments:         Anesthesia Quick Evaluation

## 2021-11-16 NOTE — Discharge Instructions (Addendum)
I found and removed one very tiny polyp. No cancer seen. I will let you know pathology results and when to have another routine colonoscopy by mail and/or My Chart.  Resume Eliquis tomorrow.  I appreciate the opportunity to care for you. Gatha Mayer, MD, FACG    YOU HAD AN ENDOSCOPIC PROCEDURE TODAY: Refer to the procedure report and other information in the discharge instructions given to you for any specific questions about what was found during the examination. If this information does not answer your questions, please call [place] office at [phone] to clarify.   YOU SHOULD EXPECT: Some feelings of bloating in the abdomen. Passage of more gas than usual. Walking can help get rid of the air that was put into your GI tract during the procedure and reduce the bloating. If you had a lower endoscopy (such as a colonoscopy or flexible sigmoidoscopy) you may notice spotting of blood in your stool or on the toilet paper. Some abdominal soreness may be present for a day or two, also.  DIET: Your first meal following the procedure should be a light meal and then it is ok to progress to your normal diet. A half-sandwich or bowl of soup is an example of a good first meal. Heavy or fried foods are harder to digest and may make you feel nauseous or bloated. Drink plenty of fluids but you should avoid alcoholic beverages for 24 hours. If you had a esophageal dilation, please see attached instructions for diet.    ACTIVITY: Your care partner should take you home directly after the procedure. You should plan to take it easy, moving slowly for the rest of the day. You can resume normal activity the day after the procedure however YOU SHOULD NOT DRIVE, use power tools, machinery or perform tasks that involve climbing or major physical exertion for 24 hours (because of the sedation medicines used during the test).   SYMPTOMS TO REPORT IMMEDIATELY: A gastroenterologist can be reached at any hour. Please  call [phone]  for any of the following symptoms:  Following lower endoscopy (colonoscopy, flexible sigmoidoscopy) Excessive amounts of blood in the stool  Significant tenderness, worsening of abdominal pains  Swelling of the abdomen that is new, acute  Fever of 100 or higher   FOLLOW UP:  If any biopsies were taken you will be contacted by phone or by letter within the next 1-3 weeks. Call [phone]  if you have not heard about the biopsies in 3 weeks.  Please also call with any specific questions about appointments or follow up tests.

## 2021-11-16 NOTE — Anesthesia Procedure Notes (Addendum)
Procedure Name: MAC Date/Time: 11/16/2021 8:41 AM Performed by: West Pugh, CRNA Pre-anesthesia Checklist: Patient identified, Emergency Drugs available, Suction available, Patient being monitored and Timeout performed Patient Re-evaluated:Patient Re-evaluated prior to induction Oxygen Delivery Method: Simple face mask Preoxygenation: Pre-oxygenation with 100% oxygen Induction Type: IV induction Placement Confirmation: positive ETCO2

## 2021-11-16 NOTE — Op Note (Signed)
[facility] Patient Name: Vicki Wheeler Procedure Date: 11/16/2021 MRN: [id number] Attending MD: Gatha Mayer , MD Date of Birth: [birth year] CSN: [id number] Age: 65 Admit Type: Outpatient Procedure:                Colonoscopy Indications:              Screening for colorectal malignant neoplasm Providers:                Gatha Mayer, MD, Carmie End, RN, Benetta Spar, Technician Referring MD:              Medicines:                Propofol per Anesthesia, Monitored Anesthesia Care Complications:            No immediate complications. Estimated Blood Loss:     Estimated blood loss was minimal. Procedure:                Pre-Anesthesia Assessment:                           - Prior to the procedure, a History and Physical                            was performed, and patient medications and                            allergies were reviewed. The patient's tolerance of                            previous anesthesia was also reviewed. The risks                            and benefits of the procedure and the sedation                            options and risks were discussed with the patient.                            All questions were answered, and informed consent                            was obtained. Prior Anticoagulants: The patient                            last took Eliquis (apixaban) 3 days prior to the                            procedure. ASA Grade Assessment: III - A patient                            with severe systemic disease. After reviewing the  risks and benefits, the patient was deemed in                            satisfactory condition to undergo the procedure.                           After obtaining informed consent, the colonoscope                            was passed under direct vision. Throughout the                            procedure, the patient's blood pressure, pulse,  and                            oxygen saturations were monitored continuously. The                            CF-HQ190L ([id number]) Olympus colonoscope was                            introduced through the anus and advanced to the the                            cecum, identified by appendiceal orifice and                            ileocecal valve. The colonoscopy was performed                            without difficulty. The patient tolerated the                            procedure well. The quality of the bowel                            preparation was excellent. The ileocecal valve,                            appendiceal orifice, and rectum were photographed. Scope In: 8:48:38 AM Scope Out: 9:05:10 AM Scope Withdrawal Time: 0 hours 13 minutes 0 seconds  Total Procedure Duration: 0 hours 16 minutes 32 seconds  Findings:      The perianal and digital rectal examinations were normal.      A diminutive polyp was found in the proximal descending colon. The polyp       was sessile. The polyp was removed with a cold snare. Resection and       retrieval were complete. Verification of patient identification for the       specimen was done. Estimated blood loss was minimal.      The exam was otherwise without abnormality on direct and retroflexion       views. Impression:               - One diminutive polyp in the proximal descending  colon, removed with a cold snare. Resected and                            retrieved.                           - The examination was otherwise normal on direct                            and retroflexion views. Moderate Sedation:      Not Applicable - Patient had care per Anesthesia. Recommendation:           - Patient has a contact number available for                            emergencies. The signs and symptoms of potential                            delayed complications were discussed with the                             patient. Return to normal activities tomorrow.                            Written discharge instructions were provided to the                            patient.                           - Resume previous diet.                           - Continue present medications.                           - Resume Eliquis (apixaban) at prior dose tomorrow.                           - Repeat colonoscopy is recommended. The                            colonoscopy date will be determined after pathology                            results from today's exam become available for                            review.                           cc; Nicki Reaper Long PA-C Procedure Code(s):        --- Professional ---                           [phone], Colonoscopy, flexible; with removal of  tumor(s), polyp(s), or other lesion(s) by snare                            technique Diagnosis Code(s):        --- Professional ---                           Z12.11, Encounter for screening for malignant                            neoplasm of colon                           K63.5, Polyp of colon CPT copyright 2019 American Medical Association. All rights reserved. The codes documented in this report are preliminary and upon coder review may  be revised to meet current compliance requirements. Gatha Mayer, MD 11/16/2021 9:13:34 AM This report has been signed electronically. Number of Addenda: 0

## 2021-11-16 NOTE — Anesthesia Postprocedure Evaluation (Signed)
Anesthesia Post Note  Patient: Vicki Wheeler  Procedure(s) Performed: COLONOSCOPY WITH PROPOFOL POLYPECTOMY     Patient location during evaluation: PACU Anesthesia Type: MAC Level of consciousness: awake and alert and oriented Pain management: pain level controlled Vital Signs Assessment: post-procedure vital signs reviewed and stable Respiratory status: spontaneous breathing, nonlabored ventilation and respiratory function stable Cardiovascular status: stable and blood pressure returned to baseline Postop Assessment: no apparent nausea or vomiting Anesthetic complications: no   No notable events documented.  Last Vitals:  Vitals:   11/16/21 0920 11/16/21 0926  BP: (!) 137/53 136/62  Pulse: (!) 51 (!) 50  Resp: 15 14  Temp:    SpO2: 100% 100%    Last Pain:  Vitals:   11/16/21 0926  TempSrc:   PainSc: 0-No pain                 [name] A.

## 2021-11-17 ENCOUNTER — Encounter: Payer: Self-pay | Admitting: Internal Medicine

## 2021-11-17 ENCOUNTER — Encounter (HOSPITAL_COMMUNITY): Payer: Self-pay | Admitting: Internal Medicine

## 2021-11-17 LAB — SURGICAL PATHOLOGY

## 2021-11-18 ENCOUNTER — Other Ambulatory Visit: Payer: Self-pay

## 2021-11-18 DIAGNOSIS — R609 Edema, unspecified: Secondary | ICD-10-CM

## 2021-11-19 LAB — BASIC METABOLIC PANEL
BUN/Creatinine Ratio: 11 — ABNORMAL LOW (ref 12–28)
BUN: 12 mg/dL (ref 8–27)
CO2: 25 mmol/L (ref 20–29)
Calcium: 9.3 mg/dL (ref 8.7–10.3)
Chloride: 102 mmol/L (ref 96–106)
Creatinine, Ser: 1.12 mg/dL — ABNORMAL HIGH (ref 0.57–1.00)
Glucose: 83 mg/dL (ref 70–99)
Potassium: 4.6 mmol/L (ref 3.5–5.2)
Sodium: 143 mmol/L (ref 134–144)
eGFR: 55 mL/min/{1.73_m2} — ABNORMAL LOW (ref >59)

## 2021-11-19 LAB — MAGNESIUM: Magnesium: 2 mg/dL (ref 1.6–2.3)

## 2021-11-25 MED ORDER — AMIODARONE HCL 200 MG PO TABS: 100.0000 mg | ORAL_TABLET | Freq: Every day | ORAL | 2 refills | Status: DC

## 2021-11-25 NOTE — Addendum Note (Signed)
Addended by: Waylan Rocher on: 11/25/2021 12:54 PM   Modules accepted: Orders

## 2021-12-10 ENCOUNTER — Other Ambulatory Visit: Payer: Self-pay | Admitting: Cardiovascular Disease

## 2021-12-10 NOTE — Telephone Encounter (Signed)
Prescription refill request for Eliquis received. Indication:Afib Last office visit:12/22 Scr:1.1 Age: 66 Weight:157 kg  Prescription refilled

## 2022-01-04 ENCOUNTER — Other Ambulatory Visit (HOSPITAL_COMMUNITY): Payer: Self-pay | Admitting: *Deleted

## 2022-01-05 ENCOUNTER — Telehealth (HOSPITAL_COMMUNITY): Payer: Self-pay | Admitting: *Deleted

## 2022-01-05 ENCOUNTER — Encounter: Payer: Self-pay | Admitting: Cardiovascular Disease

## 2022-01-05 ENCOUNTER — Other Ambulatory Visit: Payer: Self-pay

## 2022-01-05 MED ORDER — POTASSIUM CHLORIDE CRYS ER 20 MEQ PO TBCR: 20.0000 meq | EXTENDED_RELEASE_TABLET | Freq: Two times a day (BID) | ORAL | 3 refills | Status: DC

## 2022-01-05 NOTE — Progress Notes (Signed)
Patient sent [place] a message regaurding her refill for potassium chloride and torsemide, I called pharmacy and was informed that patients insurance denied the medication for Torsemide. Potassium Chloride was sent to [facility].

## 2022-01-05 NOTE — Telephone Encounter (Signed)
Spoke with patient about her potassium chloride and torsemide refill. I explained what was told to me by the pharmacy of her Torsemide being denied by her insurance. And patient acknowledged my statement. I was informed by patient that pharmacy will refill Torsemide for her but she has to pay for the medication. Potassium Chloride was sent to the [facility] on [address] and [address].

## 2022-01-05 NOTE — Telephone Encounter (Signed)
Pt left vm requesting refills. I called pt back to advise her to contact [facility] as she is no longer followed by the [facility].

## 2022-01-24 ENCOUNTER — Encounter: Payer: Self-pay | Admitting: Cardiovascular Disease

## 2022-01-25 MED ORDER — LISINOPRIL 20 MG PO TABS: 20.0000 mg | ORAL_TABLET | Freq: Every day | ORAL | 3 refills | Status: DC

## 2022-03-16 ENCOUNTER — Other Ambulatory Visit: Payer: Self-pay | Admitting: Cardiovascular Disease

## 2022-03-16 NOTE — Telephone Encounter (Signed)
Pt last saw Dr Sallyanne Kuster 10/27/21, last labs 11/18/21 Creat 1.12, age 66, weight 157kg, based on specified criteria pt is on appropriate dosage of Eliquis '5mg'$  BID for afib.  Will refill rx.  ?

## 2022-10-15 ENCOUNTER — Encounter: Payer: Self-pay | Admitting: Cardiovascular Disease

## 2022-12-13 ENCOUNTER — Other Ambulatory Visit: Payer: Self-pay | Admitting: Cardiovascular Disease

## 2022-12-13 ENCOUNTER — Other Ambulatory Visit: Payer: Self-pay

## 2022-12-13 ENCOUNTER — Telehealth: Payer: Self-pay | Admitting: Cardiovascular Disease

## 2022-12-13 DIAGNOSIS — I4891 Unspecified atrial fibrillation: Secondary | ICD-10-CM

## 2022-12-13 DIAGNOSIS — I480 Paroxysmal atrial fibrillation: Secondary | ICD-10-CM

## 2022-12-13 DIAGNOSIS — I484 Atypical atrial flutter: Secondary | ICD-10-CM

## 2022-12-13 MED ORDER — AMIODARONE HCL 200 MG PO TABS: 100.0000 mg | ORAL_TABLET | Freq: Every day | ORAL | 0 refills | Status: DC

## 2022-12-13 MED ORDER — TORSEMIDE 20 MG PO TABS: 20.0000 mg | ORAL_TABLET | Freq: Every day | ORAL | 0 refills | Status: DC

## 2022-12-13 MED ORDER — DILTIAZEM HCL 30 MG PO TABS: 30.0000 mg | ORAL_TABLET | ORAL | 0 refills | Status: DC | PRN

## 2022-12-13 NOTE — Telephone Encounter (Addendum)
Eliquis '5mg'$  refill request received. Patient is 67 years old, weight-157kg, Crea-1.12 on 11/18/21-needs updated labs, Diagnosis-Afib & Aflutter, and last seen by Dr. Sallyanne Kuster on 10/27/21 & has an appt pending on 01/20/2023. Dose is appropriate based on dosing criteria.   Called pt's PCP and left a message for the nurse to call back or fax over the labs the pt had drawn on 12/06/22  Called PCP again and spoke with [facility] who will get another message to [facility] who is in staffing regarding faxing the labs over sometime today  Received faxed labs from PCP Crea-0.97 on 12/06/22; will send in refill to requested pharmacy.

## 2022-12-13 NOTE — Telephone Encounter (Signed)
*  STAT* If patient is at the pharmacy, call can be transferred to refill team.   1. Which medications need to be refilled? (please list name of each medication and dose if known)   ELIQUIS 5 MG TABS tablet  amiodarone (PACERONE) 200 MG tablet  diltiazem (CARDIZEM) 30 MG tablet  torsemide (DEMADEX) 20 MG tablet   2. Which pharmacy/location (including street and city if local pharmacy) is medication to be sent to?[place] [place] - [address] AT [place]   3. Do they need a 30 day or 90 day supply? 90 day

## 2022-12-14 MED ORDER — APIXABAN 5 MG PO TABS: ORAL_TABLET | ORAL | 1 refills | Status: DC

## 2022-12-15 ENCOUNTER — Ambulatory Visit: Payer: BC Managed Care – PPO | Admitting: Cardiovascular Disease

## 2022-12-20 ENCOUNTER — Other Ambulatory Visit: Payer: Self-pay | Admitting: Cardiovascular Disease

## 2022-12-20 DIAGNOSIS — I484 Atypical atrial flutter: Secondary | ICD-10-CM

## 2022-12-20 DIAGNOSIS — I480 Paroxysmal atrial fibrillation: Secondary | ICD-10-CM

## 2022-12-20 NOTE — Telephone Encounter (Signed)
Pt last saw Dr Sallyanne Kuster 10/27/21, pt is overdue for follow-up. Pt has upcoming appt scheduled for 01/20/23 with Dr Sallyanne Kuster.  Last labs 09/01/21 Creat 1.02, pt is overdue for follow-up labwork.  Note placed on upcoming appt to draw CBC and BMP at [facility].  Age 67, weight 157kg, based on specified criteria pt is on appropriate dosage of Eliquis 59m BID for afib.  Will refill rx to get pt to appt on 01/20/23.

## 2022-12-23 ENCOUNTER — Other Ambulatory Visit: Payer: Self-pay

## 2022-12-23 DIAGNOSIS — I480 Paroxysmal atrial fibrillation: Secondary | ICD-10-CM

## 2022-12-23 MED ORDER — ATENOLOL 100 MG PO TABS: 100.0000 mg | ORAL_TABLET | Freq: Every day | ORAL | 0 refills | Status: DC

## 2022-12-23 MED ORDER — POTASSIUM CHLORIDE CRYS ER 20 MEQ PO TBCR: 20.0000 meq | EXTENDED_RELEASE_TABLET | Freq: Two times a day (BID) | ORAL | 0 refills | Status: DC

## 2023-01-20 ENCOUNTER — Telehealth: Payer: Self-pay | Admitting: Emergency Medicine

## 2023-01-20 ENCOUNTER — Ambulatory Visit: Payer: BC Managed Care – PPO | Attending: Cardiovascular Disease | Admitting: Cardiovascular Disease

## 2023-01-20 ENCOUNTER — Encounter: Payer: Self-pay | Admitting: Cardiovascular Disease

## 2023-01-20 VITALS — BP 160/79 | HR 49 | Ht 65.0 in | Wt 351.0 lb

## 2023-01-20 DIAGNOSIS — Z6841 Body Mass Index (BMI) 40.0 and over, adult: Secondary | ICD-10-CM

## 2023-01-20 DIAGNOSIS — Z5181 Encounter for therapeutic drug level monitoring: Secondary | ICD-10-CM | POA: Diagnosis not present

## 2023-01-20 DIAGNOSIS — I480 Paroxysmal atrial fibrillation: Secondary | ICD-10-CM

## 2023-01-20 DIAGNOSIS — I10 Essential (primary) hypertension: Secondary | ICD-10-CM

## 2023-01-20 DIAGNOSIS — E1169 Type 2 diabetes mellitus with other specified complication: Secondary | ICD-10-CM

## 2023-01-20 DIAGNOSIS — D6869 Other thrombophilia: Secondary | ICD-10-CM

## 2023-01-20 DIAGNOSIS — I5032 Chronic diastolic (congestive) heart failure: Secondary | ICD-10-CM

## 2023-01-20 DIAGNOSIS — E669 Obesity, unspecified: Secondary | ICD-10-CM

## 2023-01-20 DIAGNOSIS — Z79899 Other long term (current) drug therapy: Secondary | ICD-10-CM

## 2023-01-20 MED ORDER — ATENOLOL 50 MG PO TABS: 50.0000 mg | ORAL_TABLET | Freq: Every day | ORAL | 3 refills | Status: AC

## 2023-01-20 MED ORDER — LISINOPRIL 40 MG PO TABS: 40.0000 mg | ORAL_TABLET | Freq: Every day | ORAL | 3 refills | Status: DC
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
## 2023-01-20 NOTE — Patient Instructions (Signed)
Medication Instructions:  Increase Lisinopril to 40 Decrease Atenolol to '50mg'$  *If you need a refill on your cardiac medications before your next appointment, please call your pharmacy*  Follow-Up: At [facility], you and your health needs are our priority.  As part of our continuing mission to provide you with exceptional heart care, we have created designated Provider Care Teams.  These Care Teams include your primary Cardiologist (physician) and Advanced Practice Providers (APPs -  Physician Assistants and Nurse Practitioners) who all work together to provide you with the care you need, when you need it.  We recommend signing up for the patient portal called "MyChart".  Sign up information is provided on this After Visit Summary.  MyChart is used to connect with patients for Virtual Visits (Telemedicine).  Patients are able to view lab/test results, encounter notes, upcoming appointments, etc.  Non-urgent messages can be sent to your provider as well.   To learn more about what you can do with MyChart, go to [email or web address].    Your next appointment:   1 year(s)  Provider:   Sanda Klein, MD     Other Instructions Keep a log of your BP and Heartrate for 2 weeks and send in via MyChart

## 2023-01-20 NOTE — Telephone Encounter (Signed)
Sent a fax requesting most recent lab work- lipid panel, cbc, bmet or cmet

## 2023-02-18 ENCOUNTER — Other Ambulatory Visit: Payer: Self-pay | Admitting: Cardiovascular Disease

## 2023-02-27 ENCOUNTER — Other Ambulatory Visit: Payer: Self-pay | Admitting: Cardiovascular Disease

## 2023-02-28 NOTE — Telephone Encounter (Signed)
Rx request sent to pharmacy.  

## 2023-03-20 ENCOUNTER — Other Ambulatory Visit: Payer: Self-pay | Admitting: Cardiovascular Disease

## 2023-03-20 DIAGNOSIS — I480 Paroxysmal atrial fibrillation: Secondary | ICD-10-CM

## 2023-04-05 ENCOUNTER — Other Ambulatory Visit: Payer: Self-pay | Admitting: Cardiovascular Disease

## 2023-04-06 ENCOUNTER — Other Ambulatory Visit: Payer: Self-pay | Admitting: Cardiovascular Disease

## 2023-04-06 MED ORDER — TORSEMIDE 20 MG PO TABS: 20.0000 mg | ORAL_TABLET | Freq: Every day | ORAL | 0 refills | Status: DC

## 2023-04-19 ENCOUNTER — Other Ambulatory Visit: Payer: Self-pay | Admitting: Cardiovascular Disease

## 2023-04-19 DIAGNOSIS — I484 Atypical atrial flutter: Secondary | ICD-10-CM

## 2023-04-19 DIAGNOSIS — I480 Paroxysmal atrial fibrillation: Secondary | ICD-10-CM

## 2023-04-20 ENCOUNTER — Encounter: Payer: Self-pay | Admitting: Cardiovascular Disease

## 2023-04-20 NOTE — Telephone Encounter (Signed)
Eliquis 5mg  refill request received. Patient is 67 years old, weight-159.2kg, Crea-0.97 on 12/06/22 via Care Everywhere from Ambulatory Summary from [facility] form Dr. Mable Paris office, Diagnosis-Aflutter/Afib, and last seen by Dr. Royann Shivers on 01/20/23. Dose is appropriate based on dosing criteria. Will send in refill to requested pharmacy.

## 2023-06-13 ENCOUNTER — Telehealth: Payer: Self-pay | Admitting: Cardiovascular Disease

## 2023-06-13 DIAGNOSIS — I4891 Unspecified atrial fibrillation: Secondary | ICD-10-CM

## 2023-06-13 MED ORDER — DILTIAZEM HCL 30 MG PO TABS
30.0000 mg | ORAL_TABLET | ORAL | 3 refills | Status: DC | PRN
Start: 2023-06-13 — End: 2024-07-11

## 2023-06-13 NOTE — Telephone Encounter (Signed)
Patient stated she went into AFIB on Friday and is still currently in AFIB. Friday heart rate was 101 and she was asymptomatic. Heart rate currently 68 and she is asymptomatic. She is taking her Eliquis as prescribed. I asked her  about her prn diltiazem and the one she has at home is expired. I sent over a new Rx for her. Did advise to continue monitor heart rate, taking Eliquis as prescribed and if her heart rate goes above 100 she can take 1 every 4 hours as needed for heart rate above 100. Staying hydrated. Discussed ED precautions. She verbalized understanding. Will forward to MD.

## 2023-06-13 NOTE — Telephone Encounter (Signed)
  Patient c/o Palpitations:  STAT if patient reporting lightheadedness, shortness of breath, or chest pain  How long have you had palpitations/irregular HR/ Afib? Are you having the symptoms now? No   Are you currently experiencing lightheadedness, SOB or CP? None   Do you have a history of afib (atrial fibrillation) or irregular heart rhythm? Yes   Have you checked your BP or HR? (document readings if available): highest HR 101  Are you experiencing any other symptoms?    The patient reported that last Friday, her heart rate increased significantly, and when she checked her Apple Watch, it indicated that she was experiencing atrial fibrillation, which lasted for 30 minutes. She tried to rest and eventually calmed down. However, over the weekend, she felt anxious that her atrial fibrillation might recur. She would like to ask Dr. Royann Shivers if she needs cardioversion again.

## 2023-06-13 NOTE — Telephone Encounter (Signed)
Patient aware she will need appointment for AFIB clinic.  Are you able to call and get her scheduled.

## 2023-06-13 NOTE — Telephone Encounter (Signed)
Thank you - let's get her an Afib clinic appt please

## 2023-06-15 ENCOUNTER — Encounter: Payer: Self-pay | Admitting: Cardiovascular Disease

## 2023-06-15 NOTE — Telephone Encounter (Signed)
The dose of torsemide she is on is pretty low and if she has swelling, doubling it is fine.  However, we usually also increase the potassium dose when we increase the diuretic (if doubling the diuretic double the potassium)

## 2023-06-15 NOTE — Telephone Encounter (Signed)
Follow up scheduled

## 2023-06-15 NOTE — Telephone Encounter (Signed)
Patient called back in stating she returned to normal rhythm confirmed by her apple watch on Monday evening. Feeling back to baseline and HR is 57. Appt canceled and will call if burden increases.

## 2023-06-16 ENCOUNTER — Ambulatory Visit (HOSPITAL_COMMUNITY): Payer: BC Managed Care – PPO | Admitting: Physician Assistant

## 2023-07-23 ENCOUNTER — Encounter (HOSPITAL_COMMUNITY): Payer: Self-pay

## 2023-07-23 ENCOUNTER — Ambulatory Visit (HOSPITAL_COMMUNITY)
Admission: EM | Admit: 2023-07-23 | Discharge: 2023-07-23 | Disposition: A | Discharge status: Home / Self Care | Payer: BC Managed Care – PPO

## 2023-07-23 DIAGNOSIS — W19XXXA Unspecified fall, initial encounter: Secondary | ICD-10-CM | POA: Diagnosis not present

## 2023-07-23 DIAGNOSIS — S46911A Strain of unspecified muscle, fascia and tendon at shoulder and upper arm level, right arm, initial encounter: Secondary | ICD-10-CM

## 2023-07-23 DIAGNOSIS — M79652 Pain in left thigh: Secondary | ICD-10-CM

## 2023-07-23 NOTE — Discharge Instructions (Addendum)
I do not believe you have any fractures or dislocations.  I believe you have strained your right shoulder and have a deep bruise to your left thigh.  Please continue to take Tylenol as needed and use your topical lidocaine.  Your symptoms should gradually improve over the next few days.  Seek immediate care at the nearest emergency department if you have a fall and hit your head since you are on blood thinners.  Return to clinic for any new or urgent symptoms.  Follow-up with your primary care if your pain persist beyond the next week or so.

## 2023-07-23 NOTE — ED Triage Notes (Signed)
Fall with arm pain and thigh pain. Patient fell getting into her front door on Tuesday afternoon. Patient was able to catch herself. No scrapes or bruises, no head injuries. Pain worse in the right arm and left leg.   Patient is on Eliquis blood thinner but no known wounds.
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
## 2023-09-07 ENCOUNTER — Other Ambulatory Visit (HOSPITAL_COMMUNITY): Payer: Self-pay

## 2023-09-07 ENCOUNTER — Encounter: Payer: Self-pay | Admitting: Cardiovascular Disease

## 2023-09-07 MED ORDER — AMIODARONE HCL 200 MG PO TABS
100.0000 mg | ORAL_TABLET | Freq: Every day | ORAL | 1 refills | Status: DC
  Filled 2023-09-07: qty 45, 90d supply, fill #0
  Filled 2023-11-17: qty 45, 90d supply, fill #1

## 2023-09-08 ENCOUNTER — Other Ambulatory Visit: Payer: Self-pay | Admitting: Cardiovascular Disease

## 2023-09-08 ENCOUNTER — Other Ambulatory Visit (HOSPITAL_COMMUNITY): Payer: Self-pay

## 2023-09-16 ENCOUNTER — Other Ambulatory Visit: Payer: Self-pay | Admitting: Cardiovascular Disease

## 2023-09-16 DIAGNOSIS — I480 Paroxysmal atrial fibrillation: Secondary | ICD-10-CM

## 2023-09-16 DIAGNOSIS — I484 Atypical atrial flutter: Secondary | ICD-10-CM

## 2023-09-16 NOTE — Telephone Encounter (Signed)
Prescription refill request for Eliquis received. Indication:afib Last office visit:3/24 Scr:1.13  8/24 Age: 67 Weight:161  kg  Prescription refilled

## 2023-11-17 ENCOUNTER — Other Ambulatory Visit: Payer: Self-pay | Admitting: Cardiovascular Disease

## 2023-11-17 ENCOUNTER — Other Ambulatory Visit (HOSPITAL_COMMUNITY): Payer: Self-pay

## 2024-01-17 ENCOUNTER — Other Ambulatory Visit: Payer: Self-pay | Admitting: Cardiovascular Disease

## 2024-01-24 ENCOUNTER — Telehealth: Payer: Self-pay | Admitting: Cardiovascular Disease

## 2024-01-24 MED ORDER — AMIODARONE HCL 200 MG PO TABS: 100.0000 mg | ORAL_TABLET | Freq: Every day | ORAL | 0 refills | Status: DC

## 2024-01-24 NOTE — Telephone Encounter (Signed)
 Pt's medication was sent to pt's pharmacy as requested. Confirmation received.

## 2024-01-24 NOTE — Telephone Encounter (Signed)
*  STAT* If patient is at the pharmacy, call can be transferred to refill team.   1. Which medications need to be refilled? (please list name of each medication and dose if known)  amiodarone (PACERONE) 200 MG tablet    2. Would you like to learn more about the convenience, safety, & potential cost savings by using the [facility]? No, already used before.     3. Are you open to using the [facility] ([facility]. Already did with last refill.    4. Which pharmacy/location (including street and city if local pharmacy) is medication to be sent to? [id number] - [place], [place] - [address]   5. Do they need a 30 day or 90 day supply? 90  Please [facility] to [facility]! Patient is unable to go in and get her own meds at [facility] and her ins[facility] preferred pharmacy is [facility].    Patient states she has asked for medication to go to another pharmacy her last two refill and it continues to be sent incorrectly to [facility] [place] [facility] instead which inconvenient due to someone else having to go in and get it on her behalf.

## 2024-02-26 ENCOUNTER — Other Ambulatory Visit: Payer: Self-pay | Admitting: Cardiovascular Disease

## 2024-02-28 ENCOUNTER — Other Ambulatory Visit: Payer: Self-pay

## 2024-02-28 MED ORDER — LISINOPRIL 40 MG PO TABS: 40.0000 mg | ORAL_TABLET | Freq: Every day | ORAL | 0 refills | Status: DC

## 2024-03-01 ENCOUNTER — Ambulatory Visit: Payer: Self-pay | Attending: Cardiovascular Disease | Admitting: Cardiovascular Disease

## 2024-03-01 ENCOUNTER — Encounter: Payer: Self-pay | Admitting: Cardiovascular Disease

## 2024-03-01 VITALS — BP 160/84 | HR 52 | Ht 65.0 in | Wt 355.8 lb

## 2024-03-01 DIAGNOSIS — I5032 Chronic diastolic (congestive) heart failure: Secondary | ICD-10-CM | POA: Diagnosis not present

## 2024-03-01 DIAGNOSIS — D6869 Other thrombophilia: Secondary | ICD-10-CM

## 2024-03-01 DIAGNOSIS — Z5181 Encounter for therapeutic drug level monitoring: Secondary | ICD-10-CM | POA: Diagnosis not present

## 2024-03-01 DIAGNOSIS — Z6841 Body Mass Index (BMI) 40.0 and over, adult: Secondary | ICD-10-CM

## 2024-03-01 DIAGNOSIS — E1169 Type 2 diabetes mellitus with other specified complication: Secondary | ICD-10-CM

## 2024-03-01 DIAGNOSIS — I10 Essential (primary) hypertension: Secondary | ICD-10-CM

## 2024-03-01 DIAGNOSIS — E669 Obesity, unspecified: Secondary | ICD-10-CM

## 2024-03-01 DIAGNOSIS — I480 Paroxysmal atrial fibrillation: Secondary | ICD-10-CM

## 2024-03-01 DIAGNOSIS — I4891 Unspecified atrial fibrillation: Secondary | ICD-10-CM | POA: Diagnosis not present

## 2024-03-01 DIAGNOSIS — Z79899 Other long term (current) drug therapy: Secondary | ICD-10-CM

## 2024-03-01 DIAGNOSIS — E7800 Pure hypercholesterolemia, unspecified: Secondary | ICD-10-CM

## 2024-03-01 MED ORDER — EZETIMIBE 10 MG PO TABS: 10.0000 mg | ORAL_TABLET | Freq: Every day | ORAL | 3 refills | Status: AC
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
## 2024-03-01 NOTE — Patient Instructions (Signed)
 Medication Instructions:  Zetia  10 mg daily *If you need a refill on your cardiac medications before your next appointment, please call your pharmacy*  Follow-Up: At [facility], you and your health needs are our priority.  As part of our continuing mission to provide you with exceptional heart care, our providers are all part of one team.  This team includes your primary Cardiologist (physician) and Advanced Practice Providers or APPs (Physician Assistants and Nurse Practitioners) who all work together to provide you with the care you need, when you need it.  Your next appointment:   1 year(s)  Provider:   Luana Rumple, MD     We recommend signing up for the patient portal called "MyChart".  Sign up information is provided on this After Visit Summary.  MyChart is used to connect with patients for Virtual Visits (Telemedicine).  Patients are able to view lab/test results, encounter notes, upcoming appointments, etc.  Non-urgent messages can be sent to your provider as well.   To learn more about what you can do with MyChart, go to [email or web address].        1st Floor: - Lobby - Registration  - Pharmacy  - Lab - Cafe  2nd Floor: - PV Lab - Diagnostic Testing (echo, CT, nuclear med)  3rd Floor: - Vacant  4th Floor: - TCTS (cardiothoracic surgery) - AFib Clinic - Structural Heart Clinic - Vascular Surgery  - Vascular Ultrasound  5th Floor: - HeartCare Cardiology (general and EP) - Clinical Pharmacy for coumadin, hypertension, lipid, weight-loss medications, and med management appointments    Valet parking services will be available as well.

## 2024-03-14 ENCOUNTER — Encounter: Payer: Self-pay | Admitting: Cardiovascular Disease

## 2024-03-14 ENCOUNTER — Other Ambulatory Visit: Payer: Self-pay | Admitting: Cardiovascular Disease

## 2024-03-14 DIAGNOSIS — I480 Paroxysmal atrial fibrillation: Secondary | ICD-10-CM

## 2024-03-14 DIAGNOSIS — I484 Atypical atrial flutter: Secondary | ICD-10-CM

## 2024-03-14 NOTE — Telephone Encounter (Signed)
 Eliquis  5mg  refill request received. Patient is 68 years old, weight-161.4kg, Crea-1.18 on 02/09/24 via [employer] from [employer], Diagnosis-Afib, and last seen by Dr. Alvis Ba on 03/01/24. Dose is appropriate based on dosing criteria. Will send in refill to requested pharmacy.

## 2024-04-05 ENCOUNTER — Other Ambulatory Visit: Payer: Self-pay | Admitting: Cardiovascular Disease

## 2024-04-19 ENCOUNTER — Other Ambulatory Visit: Payer: Self-pay | Admitting: Cardiovascular Disease

## 2024-04-21 ENCOUNTER — Other Ambulatory Visit: Payer: Self-pay | Admitting: Cardiovascular Disease

## 2024-05-14 ENCOUNTER — Encounter: Payer: Self-pay | Admitting: Cardiovascular Disease

## 2024-06-22 ENCOUNTER — Other Ambulatory Visit: Payer: Self-pay | Admitting: Family

## 2024-06-22 DIAGNOSIS — Z1231 Encounter for screening mammogram for malignant neoplasm of breast: Secondary | ICD-10-CM

## 2024-07-06 ENCOUNTER — Telehealth: Payer: Self-pay | Admitting: Physician Assistant

## 2024-07-06 NOTE — Telephone Encounter (Signed)
   The patient called the answering service after-hours today. Delayed callback due to emergent patient in ER. Hx reviewed, paroxysmal atrial fibrillation - usually very aware when this happens. Last week she had brief recurrence of atrial fibrillation on Saturday but spontaneously reverted back to normal rhythm. However, on Sunday 8/24 she went back in atrial fibrillation and has stayed in it ever since. States she spoke with the atrial fib clinic but I cannot see any documentation. They had reminded her about how to properly use PRN diltiazem  for elevated HR only. Her HR has been reasonably controlled in the 70s while out of rhythm, though had to take a diltiazem  once this evening for HR >100 which quickly brought it back down. Confirms she is still taking amiodarone , atenolol , Eliquis  as prescribed - very compliant with meds per her report. Denies BP concerns. She can feel when she is out of AF but denies any CP, SOB, dizziness or other significant symptoms. Since HR improved advised she continue to follow the strategy she was given but if she finds she is having to take more than 2 PRN diltiazem  in a day she should call back for further instructions. Otherwise she has already scheduled f/u with AF clinic 07/17/24. Will forward to [facility] and Dr. Francyne in case there are any med adjustments they wish to make in the meantime. Patient aware office is closed until Tuesday so would not expect to hear back before then, but she is welcome to call back if additional issues arise. ED precautions reviewed. She patient verbalized understanding and gratitude.  Donyelle Enyeart N Marcelis Wissner, PA-C

## 2024-07-10 ENCOUNTER — Other Ambulatory Visit: Payer: Self-pay | Admitting: Cardiovascular Disease

## 2024-07-10 DIAGNOSIS — I4891 Unspecified atrial fibrillation: Secondary | ICD-10-CM

## 2024-07-17 ENCOUNTER — Ambulatory Visit
Admission: RE | Admit: 2024-07-17 | Discharge: 2024-07-17 | Disposition: A | Discharge status: Home / Self Care | Source: Ambulatory Visit | Attending: Family | Admitting: Family

## 2024-07-17 ENCOUNTER — Ambulatory Visit (HOSPITAL_COMMUNITY)
Admission: RE | Admit: 2024-07-17 | Discharge: 2024-07-17 | Disposition: A | Discharge status: Home / Self Care | Source: Ambulatory Visit | Attending: Physician Assistant | Admitting: Physician Assistant

## 2024-07-17 VITALS — BP 154/94 | HR 65 | Ht 65.0 in | Wt 344.2 lb

## 2024-07-17 DIAGNOSIS — D6869 Other thrombophilia: Secondary | ICD-10-CM

## 2024-07-17 DIAGNOSIS — Z1231 Encounter for screening mammogram for malignant neoplasm of breast: Secondary | ICD-10-CM

## 2024-07-17 DIAGNOSIS — I484 Atypical atrial flutter: Secondary | ICD-10-CM

## 2024-07-17 DIAGNOSIS — I4891 Unspecified atrial fibrillation: Secondary | ICD-10-CM

## 2024-07-17 DIAGNOSIS — Z79899 Other long term (current) drug therapy: Secondary | ICD-10-CM

## 2024-07-17 DIAGNOSIS — Z5181 Encounter for therapeutic drug level monitoring: Secondary | ICD-10-CM

## 2024-07-17 MED ORDER — AMIODARONE HCL 200 MG PO TABS: ORAL_TABLET | ORAL | 3 refills | Status: DC
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
## 2024-07-17 NOTE — Patient Instructions (Signed)
 Increase amiodarone  to 200mg  twice a day for 7 days then reduce to 200mg  once a day

## 2024-08-02 ENCOUNTER — Ambulatory Visit (HOSPITAL_COMMUNITY)
Admission: RE | Admit: 2024-08-02 | Discharge: 2024-08-02 | Disposition: A | Discharge status: Home / Self Care | Source: Ambulatory Visit | Attending: Physician Assistant | Admitting: Physician Assistant

## 2024-08-02 VITALS — BP 158/90 | HR 66 | Ht 65.0 in | Wt 342.8 lb

## 2024-08-02 DIAGNOSIS — I484 Atypical atrial flutter: Secondary | ICD-10-CM

## 2024-08-02 DIAGNOSIS — Z5181 Encounter for therapeutic drug level monitoring: Secondary | ICD-10-CM | POA: Diagnosis not present

## 2024-08-02 DIAGNOSIS — I4819 Other persistent atrial fibrillation: Secondary | ICD-10-CM | POA: Diagnosis not present

## 2024-08-02 DIAGNOSIS — I4891 Unspecified atrial fibrillation: Secondary | ICD-10-CM | POA: Diagnosis not present

## 2024-08-02 DIAGNOSIS — D6869 Other thrombophilia: Secondary | ICD-10-CM | POA: Diagnosis not present

## 2024-08-02 DIAGNOSIS — Z79899 Other long term (current) drug therapy: Secondary | ICD-10-CM

## 2024-08-02 NOTE — Patient Instructions (Addendum)
  Hold below medications 7 days prior to scheduled procedure/anesthesia.  Restart medication on the normal dosing day after scheduled procedure/anesthesia    Tirzepatide Cloyde) - 9/30    Hold below medications 72 hours prior to scheduled procedure/anesthesia. Restart medication on the following day after scheduled procedure/anesthesia Empagliflozin (Jardiance) - hold 9/27    Cardioversion scheduled for: Wednesday, October 1st   - Arrive at the [facility] ([address]  and check in with ADMITTING at 9:00 AM   - Do not eat or drink anything after midnight the night prior to your procedure.   - Take all your morning medication (except diabetic medications) with a sip of water prior to arrival.  - Do NOT miss any doses of your blood thinner - if you should miss a dose or take a dose more than 4 hours late -- please notify our office immediately.  - You will not be able to drive home after your procedure. Please ensure you have a responsible adult to drive you home. You will need someone with you for 24 hours post procedure.     - Expect to be in the procedural area approximately 2 hours.   - If you feel as if you go back into normal rhythm prior to scheduled cardioversion, please notify our office immediately.   If your procedure is canceled in the cardioversion suite you will be charged a cancellation fee.        For those patients who have a scheduled procedure/anesthesia on the same day of the week as their dose, hold the medication on the day of surgery.  They can take their scheduled dose the week before.  **Patients on the above medications scheduled for elective procedures that have not held the medication for the appropriate amount of time are at risk of cancellation or change in the anesthetic plan.
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
## 2024-08-03 ENCOUNTER — Ambulatory Visit (HOSPITAL_COMMUNITY): Payer: Self-pay | Admitting: Physician Assistant

## 2024-08-03 LAB — BASIC METABOLIC PANEL WITH GFR
BUN/Creatinine Ratio: 9 — ABNORMAL LOW (ref 12–28)
BUN: 9 mg/dL (ref 8–27)
CO2: 25 mmol/L (ref 20–29)
Calcium: 9.3 mg/dL (ref 8.7–10.3)
Chloride: 101 mmol/L (ref 96–106)
Creatinine, Ser: 1.03 mg/dL — ABNORMAL HIGH (ref 0.57–1.00)
Glucose: 49 mg/dL — ABNORMAL LOW (ref 70–99)
Potassium: 4.1 mmol/L (ref 3.5–5.2)
Sodium: 143 mmol/L (ref 134–144)
eGFR: 60 mL/min/1.73 (ref >59)

## 2024-08-03 LAB — CBC
Hematocrit: 45.4 % (ref 34.0–46.6)
Hemoglobin: 14.4 g/dL (ref 11.1–15.9)
MCH: 30.4 pg (ref 26.6–33.0)
MCHC: 31.7 g/dL (ref 31.5–35.7)
MCV: 96 fL (ref 79–97)
Platelets: 209 x10E3/uL (ref 150–450)
RBC: 4.73 x10E6/uL (ref 3.77–5.28)
RDW: 13.2 % (ref 11.7–15.4)
WBC: 5.9 x10E3/uL (ref 3.4–10.8)

## 2024-08-07 NOTE — Progress Notes (Signed)
 Pt called for pre procedure instructions. Arrival time 0845 NPO after midnight explained Instructed to take am meds with sip of water and confirmed blood thinner consistency Instructed pt need for ride home tomorrow and have responsible adult with them for 24 hrs post procedure.

## 2024-08-08 ENCOUNTER — Ambulatory Visit (HOSPITAL_COMMUNITY)
Admission: RE | Admit: 2024-08-08 | Discharge: 2024-08-08 | Disposition: A | Discharge status: Home / Self Care | Attending: Cardiology | Admitting: Cardiology

## 2024-08-08 ENCOUNTER — Other Ambulatory Visit: Payer: Self-pay

## 2024-08-08 ENCOUNTER — Encounter (HOSPITAL_COMMUNITY): Admission: RE | Disposition: A | Payer: Self-pay | Source: Home / Self Care | Attending: Cardiology

## 2024-08-08 ENCOUNTER — Ambulatory Visit (HOSPITAL_COMMUNITY): Admitting: Anesthesiology

## 2024-08-08 DIAGNOSIS — I4892 Unspecified atrial flutter: Secondary | ICD-10-CM | POA: Diagnosis not present

## 2024-08-08 DIAGNOSIS — Z6841 Body Mass Index (BMI) 40.0 and over, adult: Secondary | ICD-10-CM | POA: Diagnosis not present

## 2024-08-08 DIAGNOSIS — I4819 Other persistent atrial fibrillation: Secondary | ICD-10-CM | POA: Insufficient documentation

## 2024-08-08 DIAGNOSIS — I110 Hypertensive heart disease with heart failure: Secondary | ICD-10-CM | POA: Insufficient documentation

## 2024-08-08 DIAGNOSIS — D6869 Other thrombophilia: Secondary | ICD-10-CM | POA: Diagnosis not present

## 2024-08-08 DIAGNOSIS — Z7901 Long term (current) use of anticoagulants: Secondary | ICD-10-CM | POA: Diagnosis not present

## 2024-08-08 DIAGNOSIS — I5032 Chronic diastolic (congestive) heart failure: Secondary | ICD-10-CM | POA: Insufficient documentation

## 2024-08-08 DIAGNOSIS — I4891 Unspecified atrial fibrillation: Secondary | ICD-10-CM

## 2024-08-08 DIAGNOSIS — Z7985 Long-term (current) use of injectable non-insulin antidiabetic drugs: Secondary | ICD-10-CM | POA: Insufficient documentation

## 2024-08-08 DIAGNOSIS — Z79899 Other long term (current) drug therapy: Secondary | ICD-10-CM | POA: Insufficient documentation

## 2024-08-08 DIAGNOSIS — E119 Type 2 diabetes mellitus without complications: Secondary | ICD-10-CM | POA: Insufficient documentation

## 2024-08-08 DIAGNOSIS — E039 Hypothyroidism, unspecified: Secondary | ICD-10-CM | POA: Diagnosis not present

## 2024-08-08 DIAGNOSIS — Z7984 Long term (current) use of oral hypoglycemic drugs: Secondary | ICD-10-CM | POA: Insufficient documentation

## 2024-08-08 HISTORY — PX: CARDIOVERSION: EP1203

## 2024-08-08 LAB — GLUCOSE, CAPILLARY: Glucose-Capillary: 89 mg/dL (ref 70–99)

## 2024-08-08 SURGERY — CARDIOVERSION (CATH LAB)
Anesthesia: General

## 2024-08-08 MED ORDER — PROPOFOL 10 MG/ML IV BOLUS
INTRAVENOUS | Status: DC | PRN
  Administered 2024-08-08: 80 mg via INTRAVENOUS

## 2024-08-08 MED ORDER — SODIUM CHLORIDE 0.9 % IV SOLN: INTRAVENOUS | Status: DC

## 2024-08-08 MED ORDER — SODIUM CHLORIDE 0.9% FLUSH
INTRAVENOUS | Status: DC | PRN
Start: 2024-08-08 — End: 2024-08-08
  Administered 2024-08-08: 10 mL via INTRAVENOUS

## 2024-08-08 MED ORDER — LIDOCAINE 2% (20 MG/ML) 5 ML SYRINGE
INTRAMUSCULAR | Status: DC | PRN
  Administered 2024-08-08: 80 mg via INTRAVENOUS

## 2024-08-08 SURGICAL SUPPLY — 1 items: PAD DEFIB RADIO PHYSIO CONN (PAD) ×1 IMPLANT

## 2024-08-08 NOTE — CV Procedure (Signed)
    Electrical Cardioversion Procedure Note Verneal Wiers [id number] May 27, 1956  Procedure: Electrical Cardioversion Indications:  Atrial Fibrillation  Time Out: Verified patient identification, verified procedure,medications/allergies/relevent history reviewed, required imaging and test results available.  Performed  Procedure Details  The patient was NPO after midnight. Anesthesia was administered at the beside  by Dr.Soltzfus with 50mg  of propofol .  Cardioversion was performed with synchronized biphasic defibrillation via AP pads with 200 joules.  1 attempt(s) were performed.  The patient converted to normal sinus rhythm. The patient tolerated the procedure well   IMPRESSION:  Successful cardioversion of atrial fibrillation    Oneil Parchment 08/08/2024, 10:43 AM

## 2024-08-08 NOTE — Interval H&P Note (Signed)
 History and Physical Interval Note:  08/08/2024 9:35 AM  Vicki Wheeler  has presented today for surgery, with the diagnosis of AFIB.  The various methods of treatment have been discussed with the patient and family. After consideration of risks, benefits and other options for treatment, the patient has consented to  Procedure(s): CARDIOVERSION (N/A) as a surgical intervention.  The patient's history has been reviewed, patient examined, no change in status, stable for surgery.  I have reviewed the patient's chart and labs.  Questions were answered to the patient's satisfaction.     [employer]

## 2024-08-08 NOTE — Anesthesia Postprocedure Evaluation (Signed)
 Anesthesia Post Note  Patient: Vicki Wheeler  Procedure(s) Performed: CARDIOVERSION     Patient location during evaluation: PACU Anesthesia Type: General Level of consciousness: awake and alert Pain management: pain level controlled Vital Signs Assessment: post-procedure vital signs reviewed and stable Respiratory status: spontaneous breathing, nonlabored ventilation, respiratory function stable and patient connected to nasal cannula oxygen Cardiovascular status: blood pressure returned to baseline and stable Postop Assessment: no apparent nausea or vomiting Anesthetic complications: no   There were no known notable events for this encounter.  Last Vitals:  Vitals:   08/08/24 1100 08/08/24 1110  BP: (!) 169/90 (!) 174/89  Pulse: (!) 50 (!) 48  Resp: 13 13  Temp:  36.7 C  SpO2: 97% 97%    Last Pain:  Vitals:   08/08/24 1110  TempSrc: Temporal  PainSc:                  Cordella SQUIBB Ryelynn Guedea

## 2024-08-08 NOTE — Transfer of Care (Signed)
 Immediate Anesthesia Transfer of Care Note  Patient: Vicki Wheeler  Procedure(s) Performed: CARDIOVERSION  Patient Location: Cath Lab  Anesthesia Type:GA  Level of Consciousness: awake  Airway & Oxygen Therapy: Patient Spontanous Breathing and Patient connected to nasal cannula oxygen  Post-op Assessment: Report given to RN and Post -op Vital signs reviewed and stable  Post vital signs: Reviewed and stable  Last Vitals:  Vitals Value Taken Time  BP 171/91   Temp    Pulse 53   Resp 13    SpO2 99     Last Pain:  Vitals:   08/08/24 0850  TempSrc: Temporal         Complications: There were no known notable events for this encounter.

## 2024-08-08 NOTE — Anesthesia Preprocedure Evaluation (Addendum)
 Anesthesia Evaluation  Patient identified by MRN, date of birth, ID band Patient awake    Reviewed: Allergy & Precautions, NPO status , Patient's Chart, lab work & pertinent test results  Airway Mallampati: II  TM Distance: >3 FB Neck ROM: Full    Dental no notable dental hx.    Pulmonary neg pulmonary ROS   Pulmonary exam normal        Cardiovascular hypertension, +CHF  + dysrhythmias Atrial Fibrillation  Rhythm:Irregular Rate:Normal     Neuro/Psych  Headaches  Anxiety Depression       GI/Hepatic negative GI ROS, Neg liver ROS,,,  Endo/Other  diabetesHypothyroidism    Renal/GU negative Renal ROS  negative genitourinary   Musculoskeletal negative musculoskeletal ROS (+)    Abdominal Normal abdominal exam  (+)   Peds  Hematology negative hematology ROS (+)   Anesthesia Other Findings   Reproductive/Obstetrics                              Anesthesia Physical Anesthesia Plan  ASA: 3  Anesthesia Plan: General   Post-op Pain Management:    Induction: Intravenous  PONV Risk Score and Plan: 3 and Treatment may vary due to age or medical condition  Airway Management Planned: Mask  Additional Equipment: None  Intra-op Plan:   Post-operative Plan:   Informed Consent: I have reviewed the patients History and Physical, chart, labs and discussed the procedure including the risks, benefits and alternatives for the proposed anesthesia with the patient or authorized representative who has indicated his/her understanding and acceptance.     Dental advisory given  Plan Discussed with: CRNA  Anesthesia Plan Comments:         Anesthesia Quick Evaluation

## 2024-08-09 ENCOUNTER — Encounter (HOSPITAL_COMMUNITY): Payer: Self-pay | Admitting: Cardiology

## 2024-08-28 ENCOUNTER — Ambulatory Visit (HOSPITAL_COMMUNITY)
Admission: RE | Admit: 2024-08-28 | Discharge: 2024-08-28 | Disposition: A | Discharge status: Home / Self Care | Source: Ambulatory Visit | Attending: Physician Assistant | Admitting: Physician Assistant

## 2024-08-28 VITALS — BP 142/96 | HR 74 | Ht 65.0 in | Wt 338.6 lb

## 2024-08-28 DIAGNOSIS — I4891 Unspecified atrial fibrillation: Secondary | ICD-10-CM

## 2024-08-28 DIAGNOSIS — I484 Atypical atrial flutter: Secondary | ICD-10-CM

## 2024-08-28 DIAGNOSIS — D6869 Other thrombophilia: Secondary | ICD-10-CM | POA: Diagnosis not present

## 2024-08-28 DIAGNOSIS — I4819 Other persistent atrial fibrillation: Secondary | ICD-10-CM | POA: Diagnosis not present
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
## 2024-08-28 NOTE — Patient Instructions (Addendum)
 Stop Amiodarone

## 2024-11-14 ENCOUNTER — Other Ambulatory Visit (HOSPITAL_COMMUNITY): Payer: Self-pay | Admitting: *Deleted

## 2024-11-14 MED ORDER — POTASSIUM CHLORIDE CRYS ER 20 MEQ PO TBCR: 20.0000 meq | EXTENDED_RELEASE_TABLET | Freq: Two times a day (BID) | ORAL | 3 refills | Status: AC

## 2024-11-22 ENCOUNTER — Ambulatory Visit (HOSPITAL_COMMUNITY)
Admission: RE | Admit: 2024-11-22 | Discharge: 2024-11-22 | Disposition: A | Discharge status: Home / Self Care | Source: Ambulatory Visit | Attending: Physician Assistant | Admitting: Physician Assistant

## 2024-11-22 VITALS — BP 160/92 | HR 66 | Ht 65.0 in | Wt 333.0 lb

## 2024-11-22 DIAGNOSIS — I4819 Other persistent atrial fibrillation: Secondary | ICD-10-CM

## 2024-11-22 DIAGNOSIS — I4891 Unspecified atrial fibrillation: Secondary | ICD-10-CM

## 2024-11-22 DIAGNOSIS — D6869 Other thrombophilia: Secondary | ICD-10-CM | POA: Diagnosis not present

## 2024-11-22 DIAGNOSIS — I484 Atypical atrial flutter: Secondary | ICD-10-CM | POA: Diagnosis not present

## 2024-11-22 DIAGNOSIS — I10 Essential (primary) hypertension: Secondary | ICD-10-CM | POA: Diagnosis not present

## 2024-11-22 NOTE — Patient Instructions (Addendum)
 [facility] Admission is scheduled for: Tuesday January 27th   Come to the [facility] for pre-admission work up at: 8:30 AM   You may eat/drink prior to the appointment. Please take all your normal morning medications prior to arrival.   Medications that need to be held or stopped prior to admission date:  HOLD MOUNJARO 1 week prior to admission  HOLD JARIDANCE AS OF JAN 25th  If you should miss any doses of your blood thinner going forward please notify our office immediately.   If you start any new medications after your admission is scheduled please notify our office immediately.  No Benadryl use at least 3 days prior to admission.   We will start the authorization process with your insurance for the hospital stay.   You may bring a suitcase when you check in to admissions with belongings for your stay - clothes to keep you comfortable and things to keep you busy.   All medications will be provided for you while in the hospital.  If you are on a CPAP machine - please bring with you to the hospital.    Let me know if you have any questions regarding the above instructions or about the admission in general.   [facility] RN Afib Clinic [phone]
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
## 2024-11-23 ENCOUNTER — Ambulatory Visit (HOSPITAL_COMMUNITY): Payer: Self-pay | Admitting: Physician Assistant

## 2024-11-23 ENCOUNTER — Telehealth: Payer: Self-pay

## 2024-11-23 LAB — BASIC METABOLIC PANEL WITH GFR
BUN/Creatinine Ratio: 12 (ref 12–28)
BUN: 13 mg/dL (ref 8–27)
CO2: 25 mmol/L (ref 20–29)
Calcium: 9.3 mg/dL (ref 8.7–10.3)
Chloride: 101 mmol/L (ref 96–106)
Creatinine, Ser: 1.07 mg/dL — ABNORMAL HIGH (ref 0.57–1.00)
Glucose: 73 mg/dL (ref 70–99)
Potassium: 4.6 mmol/L (ref 3.5–5.2)
Sodium: 141 mmol/L (ref 134–144)
eGFR: 57 mL/min/1.73 — ABNORMAL LOW

## 2024-11-23 LAB — MAGNESIUM: Magnesium: 2.3 mg/dL (ref 1.6–2.3)

## 2024-11-23 NOTE — Telephone Encounter (Signed)
 Medication list reviewed in anticipation of upcoming Tikosyn initiation. Patient is not taking any contraindicated or QTc prolonging medications. However, the patient is taking some medications that have interactions with Tikosyn.  Torsemide : electrolyte abnormalities (hypokalemia and hypomagnesemia) that my occur on loop diuretics may increase the risk of QTc prolongation while taking Tikosyn. Monitoring of electrolytes is recommended before and during the administration of Tikosyn.   Diltiazem : diltiazem  is a moderate CYP3A4 inhibitor, which Tikosyn is metabolized by, which may lead to an increase in Tikosyn concentration. EKG monitoring is recommended when the patient is on both therapies.   Either medication is appropriate to continue, as long as the patient is closely monitored.   Patient is anticoagulated on Eliquis  5 mg twice daily on the appropriate dose. Please ensure that patient has not missed any anticoagulation doses in the 3 weeks prior to Tikosyn initiation.   Patient will need to be counseled to avoid use of Benadryl while on Tikosyn and in the 2-3 days prior to Tikosyn initiation.  Vicki Wheeler, PharmD PGY1 Pharmacy Resident  11/23/2024

## 2024-11-30 ENCOUNTER — Encounter (HOSPITAL_COMMUNITY): Payer: Self-pay | Admitting: *Deleted

## 2024-11-30 ENCOUNTER — Telehealth (HOSPITAL_COMMUNITY): Payer: Self-pay

## 2024-11-30 NOTE — Telephone Encounter (Signed)
 Tikosyn admission has been approved for date of service: 12/10/2024. Authorization # [id number]

## 2024-12-04 ENCOUNTER — Ambulatory Visit (HOSPITAL_COMMUNITY): Admitting: Physician Assistant

## 2024-12-07 ENCOUNTER — Encounter (HOSPITAL_COMMUNITY): Payer: Self-pay

## 2024-12-10 ENCOUNTER — Ambulatory Visit (HOSPITAL_COMMUNITY): Admitting: Internal Medicine

## 2024-12-12 ENCOUNTER — Encounter (HOSPITAL_COMMUNITY): Payer: Self-pay | Admitting: Internal Medicine

## 2024-12-12 ENCOUNTER — Inpatient Hospital Stay (HOSPITAL_COMMUNITY): Admission: RE | Admit: 2024-12-12 | Admitting: Cardiology

## 2024-12-12 ENCOUNTER — Ambulatory Visit (HOSPITAL_COMMUNITY): Admission: RE | Admit: 2024-12-12 | Discharge: 2024-12-12 | Attending: Internal Medicine | Admitting: Internal Medicine

## 2024-12-12 ENCOUNTER — Other Ambulatory Visit: Payer: Self-pay

## 2024-12-12 ENCOUNTER — Ambulatory Visit (HOSPITAL_COMMUNITY): Payer: Self-pay | Admitting: Internal Medicine

## 2024-12-12 VITALS — BP 138/90 | HR 72 | Ht 65.0 in | Wt 342.0 lb

## 2024-12-12 DIAGNOSIS — I4892 Unspecified atrial flutter: Principal | ICD-10-CM | POA: Diagnosis present

## 2024-12-12 DIAGNOSIS — I484 Atypical atrial flutter: Secondary | ICD-10-CM | POA: Diagnosis not present

## 2024-12-12 DIAGNOSIS — D6869 Other thrombophilia: Secondary | ICD-10-CM | POA: Diagnosis not present

## 2024-12-12 LAB — BASIC METABOLIC PANEL WITH GFR
BUN/Creatinine Ratio: 15 (ref 12–28)
BUN: 14 mg/dL (ref 8–27)
CO2: 30 mmol/L — ABNORMAL HIGH (ref 20–29)
Calcium: 8.9 mg/dL (ref 8.7–10.3)
Chloride: 105 mmol/L (ref 96–106)
Creatinine, Ser: 0.91 mg/dL (ref 0.57–1.00)
Glucose: 91 mg/dL (ref 70–99)
Potassium: 4.4 mmol/L (ref 3.5–5.2)
Sodium: 143 mmol/L (ref 134–144)
eGFR: 70 mL/min/{1.73_m2}

## 2024-12-12 LAB — MAGNESIUM: Magnesium: 2.1 mg/dL (ref 1.6–2.3)

## 2024-12-12 MED ORDER — EZETIMIBE 10 MG PO TABS
10.0000 mg | ORAL_TABLET | Freq: Every day | ORAL | Status: AC
  Administered 2024-12-13 – 2024-12-14 (×2): 10 mg via ORAL
  Filled 2024-12-12 (×2): qty 1

## 2024-12-12 MED ORDER — DOFETILIDE 500 MCG PO CAPS
500.0000 ug | ORAL_CAPSULE | Freq: Two times a day (BID) | ORAL | Status: DC
  Administered 2024-12-12: 500 ug via ORAL
  Filled 2024-12-12: qty 1

## 2024-12-12 MED ORDER — SODIUM CHLORIDE 0.9% FLUSH: 3.0000 mL | INTRAVENOUS | Status: AC | PRN

## 2024-12-12 MED ORDER — APIXABAN 5 MG PO TABS
5.0000 mg | ORAL_TABLET | Freq: Two times a day (BID) | ORAL | Status: AC
  Administered 2024-12-12 – 2024-12-14 (×5): 5 mg via ORAL
  Filled 2024-12-12 (×5): qty 1

## 2024-12-12 MED ORDER — TORSEMIDE 20 MG PO TABS
20.0000 mg | ORAL_TABLET | Freq: Every day | ORAL | Status: AC
  Administered 2024-12-13 – 2024-12-14 (×2): 20 mg via ORAL
  Filled 2024-12-12 (×2): qty 1

## 2024-12-12 MED ORDER — SODIUM CHLORIDE 0.9% FLUSH
3.0000 mL | Freq: Two times a day (BID) | INTRAVENOUS | Status: AC
  Administered 2024-12-12 – 2024-12-14 (×4): 3 mL via INTRAVENOUS

## 2024-12-12 MED ORDER — LEVOTHYROXINE SODIUM 50 MCG PO TABS
50.0000 ug | ORAL_TABLET | Freq: Every day | ORAL | Status: AC
  Administered 2024-12-13 – 2024-12-14 (×2): 50 ug via ORAL
  Filled 2024-12-12 (×2): qty 1

## 2024-12-12 MED ORDER — ATENOLOL 25 MG PO TABS
50.0000 mg | ORAL_TABLET | Freq: Every day | ORAL | Status: AC
  Administered 2024-12-12 – 2024-12-14 (×3): 50 mg via ORAL
  Filled 2024-12-12 (×3): qty 2

## 2024-12-12 MED ORDER — ATORVASTATIN CALCIUM 10 MG PO TABS
20.0000 mg | ORAL_TABLET | Freq: Every day | ORAL | Status: AC
  Administered 2024-12-12 – 2024-12-14 (×3): 20 mg via ORAL
  Filled 2024-12-12 (×3): qty 2

## 2024-12-12 MED ORDER — SODIUM CHLORIDE 0.9 % IV SOLN: 250.0000 mL | INTRAVENOUS | Status: AC | PRN

## 2024-12-12 MED ORDER — POTASSIUM CHLORIDE CRYS ER 20 MEQ PO TBCR
20.0000 meq | EXTENDED_RELEASE_TABLET | Freq: Two times a day (BID) | ORAL | Status: AC
  Administered 2024-12-12 – 2024-12-14 (×5): 20 meq via ORAL
  Filled 2024-12-12 (×5): qty 1

## 2024-12-12 MED ORDER — LISINOPRIL 20 MG PO TABS
40.0000 mg | ORAL_TABLET | Freq: Every day | ORAL | Status: AC
  Administered 2024-12-13 – 2024-12-14 (×2): 40 mg via ORAL
  Filled 2024-12-12 (×2): qty 2
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
## 2024-12-12 NOTE — Progress Notes (Signed)
 CCMD called. Patient converted to SB at 23:39. HR 53.
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
## 2024-12-12 NOTE — H&P (Incomplete)
 "  Electrophysiology H&P  Note    Primary Care Physician: Sherre Geni LABOR, NP Primary Cardiologist: Dr Francyne Primary Electrophysiologist: Dr Kelsie (previously) -> Dr. Inocencio Referring Physician: Dr Francyne Oms Egolf is a 69 y.o. female with a history of typical atrial flutter s/p ablation, HTN, DM, CHF, and persistent atrial fibrillation/atypical atrial flutter who presents for follow up in the [facility]. The patient was initially diagnosed with atrial fibrillation 2018 after her atrial flutter ablatioin. IRL implanted at that time. History of syncope in 10/2017 with rapid 1:1 AV conduction in atrial flutter. 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 She admits to nausea and exercise intolerance which were similar symptoms to her last afib episode. She is s/p DCCV 01/09/19 but unfortunately had early return of her atrial flutter. Her flecainide  was transitioned to amiodarone . She is on Eliquis  for stroke prevention. Patient is s/p DCCV on 12/28/19.   She is s/p DCCV on 08/08/24. However, she has had quick return of atrial flutter with symptoms of fatigue on exertion. Amiodarone  was discontinued.  Patient returns for follow up for atrial fibrillation and to discuss dofetilide  admission. She remains in atrial flutter with symptoms of fatigue, especially with exertion. No bleeding issues on anticoagulation.   On follow up 12/12/24, patient is here for Tikosyn  admission. No new medications since last OV. No benadryl use. No missed doses of anticoagulant. She has not used Mounjaro in the past 2 weeks. She held Calvert on Sunday.    Today, she  denies symptoms of palpitations, chest pain, shortness of breath, orthopnea, PND, lower extremity edema, dizziness, presyncope, syncope, snoring, daytime somnolence, bleeding, or neurologic  sequela. The patient is tolerating medications without difficulties and is otherwise without complaint today.    Atrial Fibrillation Risk Factors:  she does not have symptoms or diagnosis of sleep apnea. Negative sleep study. she does not have a history of rheumatic fever. she does not have a history of alcohol use. The patient does not have a history of early familial atrial fibrillation or other arrhythmias.   Atrial Fibrillation Management history:  Previous antiarrhythmic drugs: flecainide , amiodarone  Previous cardioversions: 10/2017, 12/28/19, 08/08/24 Previous ablations: 01/2017 Anticoagulation history: Eliquis    ROS- All systems are reviewed and negative except as per the HPI above.  Physical Exam: BP 138/90   Pulse 72  Ht 5' 5 (1.651 m)     Wt 155.1 kg      LMP 04/09/2007     BMI 56.91 kg/m     BSA 2.67 m  GEN- The patient is well appearing, alert and oriented x 3 today.   Neck - no JVD or carotid bruit noted Lungs- Clear to ausculation bilaterally, normal work of breathing Heart- Irregular rate and rhythm, no murmurs, rubs or gallops, PMI not laterally displaced Extremities- no clubbing, cyanosis, or edema Skin - no rash or ecchymosis noted   Wt Readings from Last 3 Encounters:  12/12/24 (!) 155.1 kg  11/22/24 (!) 151 kg  08/28/24 (!) 153.6 kg   ECG: Atrial flutter with variable A-V block Abnormal ECG When compared with ECG of 22-Nov-2024 14:11, No significant change was found Confirmed by Terra Pac ([id number]) on 12/12/2024 9:05:54 AM   ASSESSMENT AND PLAN: Persistent Atrial Fibrillation/atrial flutter (ICD10:  I48.19) The patient's CHA2DS2-VASc score is 5, indicating a 7.2% annual risk of stroke.  ILR at RRT Previously failed amiodarone . Stopped 08/28/24.  Patient presents today for dofetilide  admission. Continue Eliquis  5 mg BID, states no missed doses in the last 3 weeks. No recent benadryl use. PharmD has screened medications. Labs currently  pending. QTc in SR 446-461 ms. CrCl 123 mL/min from [facility] on 11/22/24  Secondary Hypercoagulable State (ICD10:  D68.69) The patient is at significant risk for stroke/thromboembolism based upon her CHA2DS2-VASc Score of 5.  Continue Apixaban  (Eliquis ).  No missed doses of Eliquis .  HTN Stable today.  Chronic HFpEF EF 55-60% GDMT per primary cardiology team Appears euvolemic today.  Pt presents for tikosyn  admission as above.   Ozell Jodie Passey, PA-C  12/12/2024 3:57 PM  "

## 2024-12-12 NOTE — Progress Notes (Signed)
 Pharmacy: Dofetilide  (Tikosyn ) - Initial Consult Assessment and Electrolyte Replacement  Pharmacy consulted to assist in monitoring and replacing electrolytes in this 69 y.o. female admitted on 12/12/2024 undergoing dofetilide  initiation. First dofetilide  dose: 500 mcg 2/4 2000  Assessment:  Patient Exclusion Criteria: If any screening criteria checked as Yes, then  patient  should NOT receive dofetilide  until criteria item is corrected.  If Yes please indicate correction plan.  YES  NO Patient  Exclusion Criteria Correction Plan/Comments   [x]   []   Baseline QTc interval is greater than or equal to 440 msec. IF above YES box checked dofetilide  contraindicated unless patient has ICD; then may proceed if QTc 500-550 msec or with known ventricular conduction abnormalities may proceed with QTc 550-600 msec. QTc =   Cardiology aware baseline Qtc of 446, close monitoring   []   [x]   Patient is known or suspected to have a digoxin level greater than 2 ng/ml: No results found for: DIGOXIN     []   [x]   Creatinine clearance less than 20 ml/min (calculated using Cockcroft-Gault, actual body weight and serum creatinine): Estimated Creatinine Clearance: 92.4 mL/min (by C-G formula based on SCr of 0.91 mg/dL).     []   [x]  Patient has received drugs known to prolong the QT intervals within the last 48 hour (examples: phenothiazines, tricyclics or tetracyclic antidepressants, macrolides, 1st generation H-1 antihistamines (especially diphenhydramine), fluoroquinolones, azoles, ondansetron , metoclopramide, promethazine ).   Updated information on QT prolonging agents is available to be searched on the following database:QT prolonging agents -If SSRI or antihistamine needed, preferred options are sertraline and loratadine respectively     []   [x]  Patient received a dose of a thiazide diuretic in the last 48 hours [including hydrochlorothiazide  (Oretic ) alone or in any combination including  triamterene (Dyazide, Maxzide)].    []   [x]  Patient received a medication known to increase dofetilide  plasma concentrations prior to initial dofetilide  dose:  Trimethoprim (Primsol, Proloprim) in the last 36 hours Verapamil (Calan, Verelan) in the last 36 hours or a sustained release dose in the last 72 hours Megestrol (Megace) in the last 5 days  Cimetidine (Tagamet) in the last 6 hours Ketoconazole (Nizoral) in the last 24 hours Itraconazole (Sporanox) in the last 48 hours  Prochlorperazine (Compazine) in the last 36 hours     []   [x]   Patient is known to have a history of torsades de pointes; congenital or acquired long QT syndromes.    []   [x]   Patient has received a Class 1 and Class 3 antiarrhythmic with less than 2 half-lives since last dose. (Disopyramide, Quinidine, Procainamide, Lidocaine , Mexiletine, Flecainide , Propafenone, Sotalol, Dronedarone)    []   [x]   Patient has received amiodarone  therapy in the past 3 months or amiodarone  level is greater than 0.3 ng/ml.    Labs:    Component Value Date/Time   K 4.4 12/12/2024 0848   MG 2.1 12/12/2024 0848     Plan: Select One Calculated CrCl  Dose q12h  [x]  > 60 ml/min 500 mcg  []  40-60 ml/min 250 mcg  []  20-40 ml/min 125 mcg   [x]   Physician selected initial dose within range recommended for patients level of renal function - will monitor for response.  []   Physician selected initial dose outside of range recommended for patients level of renal function - will discuss if the dose should be altered at this time.   Patient has been appropriately anticoagulated with apixaban .  Potassium: K >/= 4: Appropriate to initiate Tikosyn , no replacement needed  ,  maintenance potassium 20 mEq PO BID re-ordered from home meds  Magnesium : Mg >2: Appropriate to initiate Tikosyn , no replacement needed    Thank you for allowing pharmacy to participate in this patient's care   Lekesha Claw CHRISTELLA Brazier 12/12/2024  6:20 PM

## 2024-12-13 ENCOUNTER — Telehealth (HOSPITAL_COMMUNITY): Payer: Self-pay | Admitting: Pharmacy Technician

## 2024-12-13 ENCOUNTER — Other Ambulatory Visit: Payer: Self-pay

## 2024-12-13 ENCOUNTER — Encounter (HOSPITAL_COMMUNITY): Payer: Self-pay | Admitting: Cardiology

## 2024-12-13 ENCOUNTER — Other Ambulatory Visit (HOSPITAL_COMMUNITY): Payer: Self-pay

## 2024-12-13 LAB — MAGNESIUM: Magnesium: 2.2 mg/dL (ref 1.7–2.4)

## 2024-12-13 LAB — BASIC METABOLIC PANEL WITH GFR
Anion gap: 8 (ref 5–15)
BUN: 13 mg/dL (ref 8–23)
CO2: 26 mmol/L (ref 22–32)
Calcium: 8.6 mg/dL — ABNORMAL LOW (ref 8.9–10.3)
Chloride: 105 mmol/L (ref 98–111)
Creatinine, Ser: 0.87 mg/dL (ref 0.44–1.00)
GFR, Estimated: >60 mL/min
Glucose, Bld: 103 mg/dL — ABNORMAL HIGH (ref 70–99)
Potassium: 4.2 mmol/L (ref 3.5–5.1)
Sodium: 139 mmol/L (ref 135–145)

## 2024-12-13 LAB — TSH: TSH: 2.46 u[IU]/mL (ref 0.350–4.500)

## 2024-12-13 LAB — HIV ANTIBODY (ROUTINE TESTING W REFLEX): HIV Screen 4th Generation wRfx: NONREACTIVE

## 2024-12-13 MED ORDER — DOFETILIDE 250 MCG PO CAPS: 250.0000 ug | ORAL_CAPSULE | Freq: Two times a day (BID) | ORAL | Status: DC

## 2024-12-13 MED ORDER — DOFETILIDE 250 MCG PO CAPS
250.0000 ug | ORAL_CAPSULE | Freq: Two times a day (BID) | ORAL | Status: AC
  Administered 2024-12-13 – 2024-12-14 (×3): 250 ug via ORAL
  Filled 2024-12-13 (×3): qty 1

## 2024-12-13 MED ORDER — DOFETILIDE 500 MCG PO CAPS
500.0000 ug | ORAL_CAPSULE | Freq: Two times a day (BID) | ORAL | Status: DC
  Administered 2024-12-13: 500 ug via ORAL
  Filled 2024-12-13: qty 1

## 2024-12-13 NOTE — Progress Notes (Signed)
 Morning EKG reviewed     Shows has converted to NSR with borderline/prolonged QTc at ~490 ms.  Given chemical conversion and QT lengthening, decrease dose to Tikosyn  250 mcg BID.   Potassium4.2 (02/05 0505) Magnesium   2.2 (02/05 0505) Creatinine, ser  0.87 (02/05 0505)  Plan for home Saturday if QTc remains stable   Vicki Wheeler, [place]  12/13/2024 11:23 AM

## 2024-12-13 NOTE — Plan of Care (Signed)

## 2024-12-13 NOTE — Telephone Encounter (Signed)
 Patient Product/process Development Scientist completed.    The patient is insured through [employer]/ [employer]. Patient has Medicare and is not eligible for a copay card, but may be able to apply for patient assistance or Medicare RX Payment Plan (Patient Must reach out to their plan, if eligible for payment plan), if available.    Ran test claim for dofetilide  (Tikosyn ) 500 mcg and the current 30 day co-pay is $20.33.   This test claim was processed through [facility]- copay amounts may vary at other pharmacies due to pharmacy/plan contracts, or as the patient moves through the different stages of their insurance plan.     Reyes Sharps, CPHT Pharmacy Technician Patient Advocate Specialist Lead [facility] Patient Advocate Team Direct Number: [phone]  Fax: [phone]

## 2024-12-13 NOTE — TOC CM/SW Note (Signed)
 Transition of Care [facility]) - Inpatient Brief Assessment   Patient Details  Name: Vicki Wheeler MRN: [id number] Date of Birth: 1956/08/01  [facility]) CM/SW Contact:    Sudie Erminio Deems, RN Phone Number: 12/13/2024, 12:17 PM   Clinical Narrative: Patient presented for Tikosyn  Load. Inpatient Case Manager spoke with the patient regarding co pay cost. Patient is agreeable to cost and would like to have the initial Rx filled via [facility] and the Rx refills escribed to [facility] on [address].No further needs identified at this time.   Transition of Care Asessment: Insurance and Status: Insurance coverage has been reviewed Patient has primary care physician: Yes Home environment has been reviewed: reviewed Prior level of function:: independent Prior/Current Home Services: No current home services Social Drivers of Health Review: SDOH reviewed no interventions necessary Readmission risk has been reviewed: Yes Transition of care needs: no transition of care needs at this time

## 2024-12-13 NOTE — Progress Notes (Addendum)
 Pharmacy: Dofetilide  (Tikosyn ) - Follow Up Assessment and Electrolyte Replacement  Pharmacy consulted to assist in monitoring and replacing electrolytes in this 70 y.o. female admitted on 12/12/2024 undergoing dofetilide  initiation. First dofetilide  dose: 500 mcg BID 2/6 PM  Labs:    Component Value Date/Time   K 4.2 12/13/2024 0505   MG 2.2 12/13/2024 0505     Plan: Potassium: K >/= 4: No additional supplementation needed - continues on PTA KCl 20 mEq BID  Magnesium : Mg > 2: No additional supplementation needed  PTA torsemide  20 mg daily continued   Thank you for allowing pharmacy to participate in this patient's care   Maurilio Fila, PharmD Clinical Pharmacist 12/13/2024  7:06 AM

## 2024-12-13 NOTE — Progress Notes (Signed)
 Mobility Specialist Progress Note;    12/13/24 0930  Mobility  Activity Ambulated with assistance  Level of Assistance Contact guard assist, steadying assist  Assistive Device Four wheel walker  Distance Ambulated (ft) 300 ft  Activity Response Tolerated well  Mobility Referral Yes  Mobility visit 1 Mobility  Mobility Specialist Start Time (ACUTE ONLY) 0930  Mobility Specialist Stop Time (ACUTE ONLY) 1000  Mobility Specialist Time Calculation (min) (ACUTE ONLY) 30 min   Pt received in bed agreeable to mobility. No physical assistance required, CGA for safety. Upon walking pt c/o nausea, however quickly recovered, HR 89. Pt returned to bed with call bell and personal belongings in reach.  Ricky Janeal MATSU, BS Mobility Specialist Please contact via Special Educational Needs Teacher or [employer] [phone]

## 2024-12-13 NOTE — Progress Notes (Addendum)
 "  Electrophysiology Rounding Note  Patient Name: Vicki Wheeler Date of Encounter: 12/13/2024  Primary Cardiologist: Jerel Balding, MD  Electrophysiologist: None    Subjective   Pt converted to sinus rhythm on Tikosyn  500 mcg BID   QTc from EKG last pm shows borderline QTc at 490 ms  The patient is doing well today.  At this time, the patient denies chest pain, shortness of breath, or any new concerns.  Inpatient Medications    Scheduled Meds:  apixaban   5 mg Oral BID   atenolol   50 mg Oral Daily   atorvastatin   20 mg Oral Daily   dofetilide   250 mcg Oral BID   ezetimibe   10 mg Oral Daily   levothyroxine   50 mcg Oral Q0600   lisinopril   40 mg Oral Daily   potassium chloride  SA  20 mEq Oral BID   sodium chloride  flush  3 mL Intravenous Q12H   torsemide   20 mg Oral Daily   Continuous Infusions:  sodium chloride      PRN Meds: sodium chloride , sodium chloride  flush   Vital Signs    Vitals:   12/12/24 1923 12/12/24 2327 12/12/24 2349 12/13/24 0341  BP: (!) 165/92 (!) 158/91 133/72 136/81  Pulse: 96 69  62  Resp: 20 16  16   Temp: 98.1 F (36.7 C) 97.9 F (36.6 C)  98.1 F (36.7 C)  TempSrc: Oral Oral  Oral  SpO2: 99% 100%  99%  Weight:      Height:       No intake or output data in the 24 hours ending 12/13/24 0716 Filed Weights   12/12/24 1820  Weight: (!) 154.8 kg    Physical Exam    GEN- NAD, A&O x 3. Normal affect.  Lungs- CTAB, Normal effort.  Heart- Regular rate and rhythm. No M/G/R GI- Soft, NT, ND Extremities- No clubbing, cyanosis, or edema Skin- no rash or lesion  Labs    CBC No results for input(s): WBC, NEUTROABS, HGB, HCT, MCV, PLT in the last 72 hours. Basic Metabolic Panel Recent Labs    97/95/73 0848 12/13/24 0505  NA 143 139  K 4.4 4.2  CL 105 105  CO2 30* 26  GLUCOSE 91 103*  BUN 14 13  CREATININE 0.91 0.87  CALCIUM  8.9 8.6*  MG 2.1 2.2    Telemetry    NSR this am, but intermittently back into  flutter; Rates overall controlled (personally reviewed)  Patient Profile     [name] is a 69 y.o. female with a past medical history significant for persistent atrial fibrillation.  They were admitted for tikosyn  load.   Assessment & Plan    Persistent atrial fibrillation Atrial flutter Pt converted to sinus rhythm on Tikosyn  500 mcg BID , now back in AFL.  Continue 500 mcg for now; low threshold to decrease.  Continue Eliquis  Creatinine, ser  0.87 (02/05 0505) Magnesium   2.2 (02/05 0505) Potassium4.2 (02/05 0505) No electrolyte supplementation needed  Obesity Body mass index is 56.8 kg/m.  Encouraged lifestyle modification   For questions or updates, please contact [facility] Please consult [email or web address] for contact info under Cardiology/STEMI.  Signed, Ozell Prentice Passey, PA-C  12/13/2024, 7:16 AM   I have seen and examined this patient with Jodie Passey.  Agree with above, note added to reflect my findings.  Patient continued to have episodes of atrial fibrillation and flutter.  She has done better on her current dose of dofetilide  and is now converting to sinus rhythm  intermittently.  No acute complaints.  GEN: No acute distress.   Neck: No JVD Cardiac: RRR, no murmurs, rubs, or gallops.  Respiratory: normal BS bases bilaterally. GI: Soft, nontender, non-distended  MS: No edema; No deformity. Neuro:  Nonfocal  Skin: warm and dry Psych: Normal affect    Persistent atrial fibrillation/flutter: Dofetilide  load in progress.  QTc is mildly prolonged.  Piper Albro reduce the dose to 250 mcg twice daily.  No need for electrolyte supplementation at this time. Secondary hypercoagulable state: Continue Eliquis  Obesity: Lifestyle modification encouraged  Mayling Aber M. Leeland Lovelady MD 12/13/2024 1:58 PM  "

## 2024-12-14 ENCOUNTER — Ambulatory Visit (HOSPITAL_COMMUNITY): Admitting: Physician Assistant

## 2024-12-14 LAB — MAGNESIUM: Magnesium: 2 mg/dL (ref 1.7–2.4)

## 2024-12-14 LAB — BASIC METABOLIC PANEL WITH GFR
Anion gap: 9 (ref 5–15)
BUN: 13 mg/dL (ref 8–23)
CO2: 25 mmol/L (ref 22–32)
Calcium: 8.4 mg/dL — ABNORMAL LOW (ref 8.9–10.3)
Chloride: 106 mmol/L (ref 98–111)
Creatinine, Ser: 0.8 mg/dL (ref 0.44–1.00)
GFR, Estimated: >60 mL/min
Glucose, Bld: 117 mg/dL — ABNORMAL HIGH (ref 70–99)
Potassium: 4.2 mmol/L (ref 3.5–5.1)
Sodium: 141 mmol/L (ref 135–145)

## 2024-12-14 MED ORDER — MAGNESIUM SULFATE 2 GM/50ML IV SOLN
2.0000 g | Freq: Once | INTRAVENOUS | Status: AC
  Administered 2024-12-14: 2 g via INTRAVENOUS
  Filled 2024-12-14: qty 50

## 2024-12-14 NOTE — Progress Notes (Signed)
 Mobility Specialist Progress Note;    12/14/24 1128  Mobility  Activity Ambulated with assistance  Level of Assistance Contact guard assist, steadying assist  Assistive Device Four wheel walker  Distance Ambulated (ft) 300 ft  Activity Response Tolerated well  Mobility Referral Yes  Mobility visit 1 Mobility  Mobility Specialist Start Time (ACUTE ONLY) 1128  Mobility Specialist Stop Time (ACUTE ONLY) 1142  Mobility Specialist Time Calculation (min) (ACUTE ONLY) 14 min   Pt received in bed agreeable to mobility. No physical assistance required CGA for safety. HR reached 75 BPM, no complaints throughout. Pt returned to room requesting to use BR, independently performed pericare. Returned to bed with call bell and personal belongings in reach. All needs met.  Ricky Janeal MATSU, BS Mobility Specialist Please contact via Special Educational Needs Teacher or [employer] [phone]

## 2024-12-14 NOTE — Progress Notes (Signed)
 Morning EKG reviewed     Shows remains in NSR with borderline QTc at ~470 ms, improved after second decreased dose at 250  Continue  Tikosyn  250 mcg BID.   Potassium4.2 (02/06 0440) Magnesium   2.0 (02/06 0440) Creatinine, ser  0.80 (02/06 0440)  Plan for home tomorrow if QTc remains stable.   Ozell Prentice Passey, PA-C  12/14/2024 11:03 AM

## 2024-12-14 NOTE — Discharge Instructions (Signed)
 Dofetilide  Capsules What is this medication? DOFETILIDE  (doe FET il ide) treats a fast or irregular heartbeat (arrhythmia). It works by slowing down overactive electric signals in the heart, which stabilizes your heart rhythm. It belongs to a group of medications called antiarrhythmics. This medicine may be used for other purposes; ask your health care provider or pharmacist if you have questions. COMMON BRAND NAME(S): Tikosyn  What should I tell my care team before I take this medication? They need to know if you have any of these conditions: Heart disease History of irregular heartbeat History of low levels of potassium or magnesium  in the blood Kidney disease Liver disease An unusual or allergic reaction to dofetilide , other medications, foods, dyes, or preservatives Pregnant or trying to get pregnant Breast-feeding How should I use this medication? Take this medication by mouth with a glass of water. Follow the directions on the prescription label. Do not take with grapefruit juice. You can take it with or without food. If it upsets your stomach, take it with food. Take your medication at regular intervals. Do not take it more often than directed. Do not stop taking except on your care team's advice. A special MedGuide will be given to you by the pharmacist with each prescription and refill. Be sure to read this information carefully each time. Talk to your care team about the use of this medication in children. Special care may be needed. Overdosage: If you think you have taken too much of this medicine contact a poison control center or emergency room at once. NOTE: This medicine is only for you. Do not share this medicine with others. What if I miss a dose? If you miss a dose, skip it. Take your next dose at the normal time. Do not take extra or 2 doses at the same time to make up for the missed dose. What may interact with this medication? Do not take this medication with any of  the following: Benadryl (Diphenhydramine) Cimetidine (Tagamet) Cisapride Dolutegravir Dronedarone Erdafitinib Hydrochlorothiazide Immodium Ketoconazole Megestrol Pimozide Prochlorperazine Thioridazine Trimethoprim Verapamil This medication may also interact with the following: Amiloride Cannabinoids Certain antibiotics like erythromycin or clarithromycin  Certain antiviral medications for HIV or hepatitis Certain medications for depression, anxiety, or psychotic disorders Digoxin  Diltiazem  Grapefruit juice Metformin  Nefazodone Other medications that prolong the QT interval (an abnormal heart rhythm) Quinine Triamterene Zafirlukast Ziprasidone This list may not describe all possible interactions. Give your health care provider a list of all the medicines, herbs, non-prescription drugs, or dietary supplements you use. Also tell them if you smoke, drink alcohol, or use illegal drugs. Some items may interact with your medicine. What should I watch for while using this medication? Your condition will be monitored carefully while you are receiving this medication. What side effects may I notice from receiving this medication? Side effects that you should report to your care team as soon as possible: Allergic reactions--skin rash, itching, hives, swelling of the face, lips, tongue, or throat Chest pain Heart rhythm changes--fast or irregular heartbeat, dizziness, feeling faint or lightheaded, chest pain, trouble breathing Side effects that usually do not require medical attention (report to your care team if they continue or are bothersome): Dizziness Headache Nausea Stomach pain Trouble sleeping This list may not describe all possible side effects. Call your doctor for medical advice about side effects. You may report side effects to FDA at 1-800-FDA-1088. Where should I keep my medication? Keep out of the reach of children. Store at room temperature between 15  and 30  degrees C (59 and 86 degrees F). Throw away any unused medication after the expiration date. NOTE: This sheet is a summary. It may not cover all possible information. If you have questions about this medicine, talk to your doctor, pharmacist, or health care provider.  2024 Elsevier/Gold Standard (2021-09-25 00:00:00)

## 2024-12-14 NOTE — Progress Notes (Signed)
 Pharmacy: Dofetilide  (Tikosyn ) - Follow Up Assessment and Electrolyte Replacement  Pharmacy consulted to assist in monitoring and replacing electrolytes in this 69 y.o. female admitted on 12/12/2024 undergoing dofetilide  initiation. First dofetilide  dose: 500 mcg - reduced to 250 mcg d/t QT prolongation.  Labs:    Component Value Date/Time   K 4.2 12/14/2024 0440   MG 2.0 12/14/2024 0440     Plan: Potassium: K >/= 4: No additional supplementation needed (already taking KCl 20 BID)  Magnesium : Mg 1.8-2: Give Mg 2 gm IV x1    Vicki Wheeler, Vicki Wheeler, BCPS, [facility] Clinical Pharmacist  12/14/2024 11:54 AM   [facility] pharmacy phone numbers are listed on [email or web address]

## 2024-12-14 NOTE — Progress Notes (Addendum)
 "  Electrophysiology Rounding Note  Patient Name: Vicki Wheeler Date of Encounter: 12/14/2024  Primary Cardiologist: Jerel Balding, MD  Electrophysiologist: None    Subjective   Pt remains in NSR on Tikosyn  250 mcg BID   QTc from EKG last pm shows prolonged QTc at ~500 ms (after FIRST dose decrease)  The patient is doing well today.  At this time, the patient denies chest pain, shortness of breath, or any new concerns.  Inpatient Medications    Scheduled Meds:  apixaban   5 mg Oral BID   atenolol   50 mg Oral Daily   atorvastatin   20 mg Oral Daily   dofetilide   250 mcg Oral BID   ezetimibe   10 mg Oral Daily   levothyroxine   50 mcg Oral Q0600   lisinopril   40 mg Oral Daily   potassium chloride  SA  20 mEq Oral BID   sodium chloride  flush  3 mL Intravenous Q12H   torsemide   20 mg Oral Daily   Continuous Infusions:  PRN Meds: sodium chloride  flush   Vital Signs    Vitals:   12/13/24 1638 12/13/24 2039 12/13/24 2327 12/14/24 0420  BP: (!) 141/62 (!) 144/64 131/64 (!) 140/74  Pulse: (!) 56 64 (!) 47   Resp: 20 20 20 20   Temp: 98.2 F (36.8 C) 98.1 F (36.7 C) 97.7 F (36.5 C) 98.3 F (36.8 C)  TempSrc: Oral Oral Oral Oral  SpO2: 96% 95% 97% 97%  Weight:      Height:        Intake/Output Summary (Last 24 hours) at 12/14/2024 0721 Last data filed at 12/14/2024 0428 Gross per 24 hour  Intake 240 ml  Output --  Net 240 ml   Filed Weights   12/12/24 1820  Weight: (!) 154.8 kg    Physical Exam    GEN- NAD, A&O x 3. Normal affect.  Lungs- CTAB, Normal effort.  Heart- Regular rate and rhythm. No M/G/R GI- Soft, NT, ND Extremities- No clubbing, cyanosis, or edema Skin- no rash or lesion  Labs    CBC No results for input(s): WBC, NEUTROABS, HGB, HCT, MCV, PLT in the last 72 hours. Basic Metabolic Panel Recent Labs    97/94/73 0505 12/14/24 0440  NA 139 141  K 4.2 4.2  CL 105 106  CO2 26 25  GLUCOSE 103* 117*  BUN 13 13  CREATININE  0.87 0.80  CALCIUM  8.6* 8.4*  MG 2.2 2.0    Telemetry    SB/NSR 40-60s (personally reviewed)  Patient Profile     Vicki Wheeler is a 69 y.o. female with a past medical history significant for persistent atrial fibrillation.  They were admitted for tikosyn  load.   Assessment & Plan    Persistent atrial fibrillation Pt remains in NSR on Tikosyn  250 mcg BID. QT slightly long after first dose after decrease. Karla Vines follow again on 250 this am.  Continue Eliquis  Creatinine, ser  0.80 (02/06 0440) Magnesium   2.0 (02/06 0440) Potassium4.2 (02/06 0440) No electrolyte supplementation needed  Obesity Body mass index is 56.8 kg/m.  Encouraged lifestyle modification    For questions or updates, please contact [facility] Please consult [email or web address] for contact info under Cardiology/STEMI.  Signed, Vicki Prentice Passey, PA-C  12/14/2024, 7:21 AM   I have seen and examined this patient with Vicki Wheeler.  Agree with above, note added to reflect my findings.  Patient feeling well.  No acute complaints.  GEN: No acute distress.   Neck: No JVD Cardiac:  Regular rhythm Respiratory: Normal work of breathing GI: Soft, nontender, non-distended  MS: No edema; No deformity. Neuro:  Nonfocal  Skin: warm and dry Psych: Normal affect    Persistent atrial fibrillation/secondary hypercoagulable state: On Eliquis .  Dofetilide  load in progress.  Has converted to sinus rhythm.  QTc is prolonged on dofetilide  250 mcg twice daily.  Last night was her first dose of 250 mcg.  Vicki Wheeler give her 1 more dose, but if her QT remains prolonged, we Vicki Wheeler reduce to 125 mcg twice daily. Obesity: Lifestyle modification encouraged  Vicki Sieler M. Arlet Marter MD 12/14/2024 7:47 AM  "

## 2024-12-20 ENCOUNTER — Ambulatory Visit (HOSPITAL_COMMUNITY): Admitting: Physician Assistant
